# Patient Record
Sex: Male | Born: 1975 | Race: Black or African American | Hispanic: No | Marital: Single | Smoking: Current some day smoker
Health system: Southern US, Community
[De-identification: ages and names within clinical notes are randomized; demographics above are authoritative.]

## PROBLEM LIST (undated history)

## (undated) DIAGNOSIS — R569 Unspecified convulsions: Secondary | ICD-10-CM

## (undated) DIAGNOSIS — F1910 Other psychoactive substance abuse, uncomplicated: Secondary | ICD-10-CM

## (undated) DIAGNOSIS — F1010 Alcohol abuse, uncomplicated: Secondary | ICD-10-CM

## (undated) DIAGNOSIS — I10 Essential (primary) hypertension: Secondary | ICD-10-CM

## (undated) DIAGNOSIS — F1410 Cocaine abuse, uncomplicated: Secondary | ICD-10-CM

## (undated) HISTORY — PX: FACIAL COSMETIC SURGERY: SHX629

---

## 2000-10-12 ENCOUNTER — Emergency Department (HOSPITAL_COMMUNITY): Admission: EM | Admit: 2000-10-12 | Discharge: 2000-10-12 | Payer: Self-pay | Admitting: *Deleted

## 2002-07-17 ENCOUNTER — Encounter: Payer: Self-pay | Admitting: Emergency Medicine

## 2002-07-17 ENCOUNTER — Emergency Department (HOSPITAL_COMMUNITY): Admission: EM | Admit: 2002-07-17 | Discharge: 2002-07-17 | Payer: Self-pay | Admitting: Emergency Medicine

## 2002-07-19 ENCOUNTER — Emergency Department (HOSPITAL_COMMUNITY): Admission: EM | Admit: 2002-07-19 | Discharge: 2002-07-19 | Payer: Self-pay | Admitting: Emergency Medicine

## 2002-07-19 ENCOUNTER — Encounter: Payer: Self-pay | Admitting: *Deleted

## 2002-09-25 ENCOUNTER — Emergency Department (HOSPITAL_COMMUNITY): Admission: EM | Admit: 2002-09-25 | Discharge: 2002-09-26 | Payer: Self-pay | Admitting: Emergency Medicine

## 2002-09-26 ENCOUNTER — Encounter: Payer: Self-pay | Admitting: Emergency Medicine

## 2002-10-26 ENCOUNTER — Emergency Department (HOSPITAL_COMMUNITY): Admission: EM | Admit: 2002-10-26 | Discharge: 2002-10-26 | Payer: Self-pay | Admitting: Emergency Medicine

## 2004-04-17 ENCOUNTER — Emergency Department (HOSPITAL_COMMUNITY): Admission: EM | Admit: 2004-04-17 | Discharge: 2004-04-18 | Payer: Self-pay | Admitting: Emergency Medicine

## 2004-07-17 ENCOUNTER — Emergency Department (HOSPITAL_COMMUNITY): Admission: EM | Admit: 2004-07-17 | Discharge: 2004-07-17 | Payer: Self-pay | Admitting: *Deleted

## 2004-07-22 ENCOUNTER — Emergency Department (HOSPITAL_COMMUNITY): Admission: EM | Admit: 2004-07-22 | Discharge: 2004-07-22 | Payer: Self-pay | Admitting: *Deleted

## 2004-07-29 ENCOUNTER — Emergency Department (HOSPITAL_COMMUNITY): Admission: EM | Admit: 2004-07-29 | Discharge: 2004-07-29 | Payer: Self-pay | Admitting: *Deleted

## 2004-08-13 ENCOUNTER — Emergency Department (HOSPITAL_COMMUNITY): Admission: EM | Admit: 2004-08-13 | Discharge: 2004-08-13 | Payer: Self-pay | Admitting: Emergency Medicine

## 2004-08-29 ENCOUNTER — Emergency Department (HOSPITAL_COMMUNITY): Admission: EM | Admit: 2004-08-29 | Discharge: 2004-08-29 | Payer: Self-pay | Admitting: Emergency Medicine

## 2004-09-26 ENCOUNTER — Emergency Department (HOSPITAL_COMMUNITY): Admission: EM | Admit: 2004-09-26 | Discharge: 2004-09-26 | Payer: Self-pay | Admitting: Emergency Medicine

## 2004-09-29 ENCOUNTER — Ambulatory Visit: Payer: Self-pay | Admitting: Orthopedic Surgery

## 2004-10-13 ENCOUNTER — Emergency Department (HOSPITAL_COMMUNITY): Admission: EM | Admit: 2004-10-13 | Discharge: 2004-10-13 | Payer: Self-pay | Admitting: Emergency Medicine

## 2005-08-23 ENCOUNTER — Emergency Department (HOSPITAL_COMMUNITY): Admission: EM | Admit: 2005-08-23 | Discharge: 2005-08-24 | Payer: Self-pay | Admitting: Emergency Medicine

## 2008-04-27 ENCOUNTER — Emergency Department (HOSPITAL_COMMUNITY): Admission: EM | Admit: 2008-04-27 | Discharge: 2008-04-27 | Payer: Self-pay | Admitting: Emergency Medicine

## 2009-06-18 ENCOUNTER — Emergency Department (HOSPITAL_COMMUNITY): Admission: EM | Admit: 2009-06-18 | Discharge: 2009-06-18 | Payer: Self-pay | Admitting: Emergency Medicine

## 2009-08-25 ENCOUNTER — Emergency Department (HOSPITAL_COMMUNITY): Admission: EM | Admit: 2009-08-25 | Discharge: 2009-08-25 | Payer: Self-pay | Admitting: Emergency Medicine

## 2009-11-14 ENCOUNTER — Emergency Department (HOSPITAL_COMMUNITY): Admission: EM | Admit: 2009-11-14 | Discharge: 2009-11-14 | Payer: Self-pay | Admitting: Emergency Medicine

## 2010-04-24 LAB — DIFFERENTIAL
Basophils Relative: 0 % (ref 0–1)
Eosinophils Relative: 0 % (ref 0–5)
Lymphs Abs: 1.6 10*3/uL (ref 0.7–4.0)
Monocytes Absolute: 0.3 10*3/uL (ref 0.1–1.0)
Neutro Abs: 9.2 10*3/uL — ABNORMAL HIGH (ref 1.7–7.7)
Neutrophils Relative %: 82 % — ABNORMAL HIGH (ref 43–77)

## 2010-04-24 LAB — URINE MICROSCOPIC-ADD ON

## 2010-04-24 LAB — URINALYSIS, ROUTINE W REFLEX MICROSCOPIC
Glucose, UA: NEGATIVE mg/dL
Leukocytes, UA: NEGATIVE
Nitrite: NEGATIVE
Protein, ur: NEGATIVE mg/dL
Specific Gravity, Urine: 1.015 (ref 1.005–1.030)
pH: 5 (ref 5.0–8.0)

## 2010-04-24 LAB — CBC
Hemoglobin: 15.9 g/dL (ref 13.0–17.0)
MCH: 31.7 pg (ref 26.0–34.0)
MCHC: 34 g/dL (ref 30.0–36.0)
Platelets: 207 10*3/uL (ref 150–400)
RDW: 12.5 % (ref 11.5–15.5)

## 2010-04-24 LAB — BASIC METABOLIC PANEL
Calcium: 8.8 mg/dL (ref 8.4–10.5)
Chloride: 109 mEq/L (ref 96–112)
GFR calc non Af Amer: 49 mL/min — ABNORMAL LOW (ref >60)
Sodium: 142 mEq/L (ref 135–145)

## 2010-04-24 LAB — GLUCOSE, CAPILLARY: Glucose-Capillary: 91 mg/dL (ref 70–99)

## 2010-04-24 LAB — RAPID URINE DRUG SCREEN, HOSP PERFORMED
Barbiturates: NOT DETECTED
Benzodiazepines: POSITIVE — AB

## 2010-05-20 LAB — RAPID STREP SCREEN (MED CTR MEBANE ONLY): Streptococcus, Group A Screen (Direct): POSITIVE — AB

## 2011-02-05 ENCOUNTER — Encounter: Payer: Self-pay | Admitting: Emergency Medicine

## 2011-02-05 ENCOUNTER — Emergency Department (HOSPITAL_COMMUNITY)
Admission: EM | Admit: 2011-02-05 | Discharge: 2011-02-05 | Disposition: A | Discharge status: Home / Self Care | Payer: Self-pay | Attending: Emergency Medicine | Admitting: Emergency Medicine

## 2011-02-05 DIAGNOSIS — F17200 Nicotine dependence, unspecified, uncomplicated: Secondary | ICD-10-CM | POA: Insufficient documentation

## 2011-02-05 DIAGNOSIS — K0889 Other specified disorders of teeth and supporting structures: Secondary | ICD-10-CM

## 2011-02-05 DIAGNOSIS — K029 Dental caries, unspecified: Secondary | ICD-10-CM | POA: Insufficient documentation

## 2011-02-05 DIAGNOSIS — K089 Disorder of teeth and supporting structures, unspecified: Secondary | ICD-10-CM | POA: Insufficient documentation

## 2011-02-05 MED ORDER — IBUPROFEN 800 MG PO TABS
800.0000 mg | ORAL_TABLET | Freq: Once | ORAL | Status: AC
  Administered 2011-02-05: 800 mg via ORAL
  Filled 2011-02-05: qty 1

## 2011-02-05 MED ORDER — HYDROCODONE-ACETAMINOPHEN 5-325 MG PO TABS: 1.0000 | ORAL_TABLET | Freq: Four times a day (QID) | ORAL | Status: AC | PRN

## 2011-02-05 MED ORDER — HYDROCODONE-ACETAMINOPHEN 5-325 MG PO TABS
1.0000 | ORAL_TABLET | Freq: Once | ORAL | Status: AC
  Administered 2011-02-05: 1 via ORAL
  Filled 2011-02-05: qty 1

## 2011-02-05 MED ORDER — PENICILLIN V POTASSIUM 250 MG PO TABS
500.0000 mg | ORAL_TABLET | Freq: Once | ORAL | Status: AC
  Administered 2011-02-05: 500 mg via ORAL
  Filled 2011-02-05: qty 2

## 2011-02-05 MED ORDER — PENICILLIN V POTASSIUM 500 MG PO TABS: 500.0000 mg | ORAL_TABLET | Freq: Four times a day (QID) | ORAL | Status: AC

## 2011-02-05 NOTE — ED Notes (Signed)
Pt c/o dental pain since last night.  

## 2011-02-05 NOTE — ED Provider Notes (Signed)
History     CSN: [id number]  Arrival date & time 02/05/11  1021   First MD Initiated Contact with Patient 02/05/11 1105      Chief Complaint  Patient presents with  . Dental Pain    (Consider location/radiation/quality/duration/timing/severity/associated sxs/prior treatment) Patient is a 34 y.o. male presenting with tooth pain. The history is provided by the patient. No language interpreter was used.  Dental PainThe primary symptoms include mouth pain. Primary symptoms do not include fever. The symptoms began yesterday. The symptoms are unchanged. The symptoms are new. The symptoms occur constantly.    History reviewed. No pertinent past medical history.  History reviewed. No pertinent past surgical history.  History reviewed. No pertinent family history.  History  Substance Use Topics  . Smoking status: Passive Smoker  . Smokeless tobacco: Not on file  . Alcohol Use: Yes      Review of Systems  Constitutional: Negative for fever.  HENT: Positive for dental problem.   All other systems reviewed and are negative.    Allergies  Onion  Home Medications  No current outpatient prescriptions on file.  BP 137/85  Pulse 90  Temp(Src) 98.9 F (37.2 C) (Oral)  Resp 18  Ht 5\' 9"  (1.753 m)  Wt 170 lb (77.111 kg)  BMI 25.10 kg/m2  SpO2 100%  Physical Exam  Nursing note and vitals reviewed. Constitutional: He is oriented to person, place, and time. He appears well-developed and well-nourished. No distress.  HENT:  Head: Normocephalic and atraumatic.  Mouth/Throat: Uvula is midline and mucous membranes are normal. Abnormal dentition. No oropharyngeal exudate.    Eyes: EOM are normal.  Neck: Normal range of motion.  Cardiovascular: Normal rate, regular rhythm, normal heart sounds and intact distal pulses.   Pulmonary/Chest: Effort normal and breath sounds normal. No respiratory distress.  Abdominal: Soft. He exhibits no distension. There is no tenderness.    Musculoskeletal: Normal range of motion.  Neurological: He is alert and oriented to person, place, and time.  Skin: Skin is warm and dry.  Psychiatric: He has a normal mood and affect. Judgment normal.    ED Course  Procedures (including critical care time)  Labs Reviewed - No data to display No results found.   No diagnosis found.    MDM          Worthy Rancher, PA 02/05/11 1224

## 2011-02-05 NOTE — ED Provider Notes (Signed)
Medical screening examination/treatment/procedure(s) were performed by non-physician practitioner and as supervising physician I was immediately available for consultation/collaboration.   Glynn Octave, MD 02/05/11 [phone]

## 2011-02-05 NOTE — ED Notes (Signed)
Pt states tooth ache "kept me awake all night, took tylenol and motrin with no relief."  Pt has 2 dental caries in bilateral lower molars. Mild edema and redness noted. No facial; swelling noted.

## 2011-06-23 ENCOUNTER — Emergency Department (HOSPITAL_COMMUNITY)
Admission: EM | Admit: 2011-06-23 | Discharge: 2011-06-23 | Disposition: A | Discharge status: Home / Self Care | Payer: Self-pay | Attending: Emergency Medicine | Admitting: Emergency Medicine

## 2011-06-23 ENCOUNTER — Encounter (HOSPITAL_COMMUNITY): Payer: Self-pay | Admitting: *Deleted

## 2011-06-23 ENCOUNTER — Encounter (HOSPITAL_COMMUNITY): Payer: Self-pay

## 2011-06-23 ENCOUNTER — Emergency Department (HOSPITAL_COMMUNITY): Payer: Self-pay

## 2011-06-23 DIAGNOSIS — S0190XA Unspecified open wound of unspecified part of head, initial encounter: Secondary | ICD-10-CM | POA: Insufficient documentation

## 2011-06-23 DIAGNOSIS — R221 Localized swelling, mass and lump, neck: Secondary | ICD-10-CM | POA: Insufficient documentation

## 2011-06-23 DIAGNOSIS — S0001XA Abrasion of scalp, initial encounter: Secondary | ICD-10-CM

## 2011-06-23 DIAGNOSIS — J3489 Other specified disorders of nose and nasal sinuses: Secondary | ICD-10-CM | POA: Insufficient documentation

## 2011-06-23 DIAGNOSIS — F1010 Alcohol abuse, uncomplicated: Secondary | ICD-10-CM | POA: Insufficient documentation

## 2011-06-23 DIAGNOSIS — S0101XA Laceration without foreign body of scalp, initial encounter: Secondary | ICD-10-CM

## 2011-06-23 DIAGNOSIS — S41109A Unspecified open wound of unspecified upper arm, initial encounter: Secondary | ICD-10-CM | POA: Insufficient documentation

## 2011-06-23 DIAGNOSIS — S022XXA Fracture of nasal bones, initial encounter for closed fracture: Secondary | ICD-10-CM | POA: Insufficient documentation

## 2011-06-23 DIAGNOSIS — R000 Tachycardia, unspecified: Secondary | ICD-10-CM | POA: Insufficient documentation

## 2011-06-23 DIAGNOSIS — R220 Localized swelling, mass and lump, head: Secondary | ICD-10-CM | POA: Insufficient documentation

## 2011-06-23 DIAGNOSIS — R51 Headache: Secondary | ICD-10-CM | POA: Insufficient documentation

## 2011-06-23 DIAGNOSIS — S0990XA Unspecified injury of head, initial encounter: Secondary | ICD-10-CM | POA: Insufficient documentation

## 2011-06-23 DIAGNOSIS — M542 Cervicalgia: Secondary | ICD-10-CM | POA: Insufficient documentation

## 2011-06-23 DIAGNOSIS — S0100XA Unspecified open wound of scalp, initial encounter: Secondary | ICD-10-CM | POA: Insufficient documentation

## 2011-06-23 DIAGNOSIS — R5381 Other malaise: Secondary | ICD-10-CM | POA: Insufficient documentation

## 2011-06-23 DIAGNOSIS — R42 Dizziness and giddiness: Secondary | ICD-10-CM | POA: Insufficient documentation

## 2011-06-23 LAB — CBC
HCT: 44.8 % (ref 39.0–52.0)
MCHC: 33.9 g/dL (ref 30.0–36.0)
Platelets: 232 10*3/uL (ref 150–400)

## 2011-06-23 MED ORDER — HYDROCODONE-ACETAMINOPHEN 5-325 MG PO TABS
1.0000 | ORAL_TABLET | Freq: Once | ORAL | Status: AC
  Administered 2011-06-23: 1 via ORAL
  Filled 2011-06-23: qty 1

## 2011-06-23 MED ORDER — IBUPROFEN 600 MG PO TABS: 600.0000 mg | ORAL_TABLET | Freq: Four times a day (QID) | ORAL | Status: DC | PRN

## 2011-06-23 MED ORDER — OXYMETAZOLINE HCL 0.05 % NA SOLN
NASAL | Status: AC
  Administered 2011-06-23: 2
  Filled 2011-06-23: qty 15

## 2011-06-23 MED ORDER — HYDROCODONE-ACETAMINOPHEN 5-325 MG PO TABS: 1.0000 | ORAL_TABLET | ORAL | Status: DC | PRN
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
## 2011-06-23 NOTE — Discharge Instructions (Signed)
Head Injury, Adult  You have had a head injury that does not appear serious at this time. A concussion is a state of changed mental ability, usually from a blow to the head. You should take clear liquids for the rest of the day and then resume your regular diet. You should not take sedatives or alcoholic beverages for as long as directed by your caregiver after discharge. After injuries such as yours, most problems occur within the first 24 hours.  SYMPTOMS  These minor symptoms may be experienced after discharge:  · Memory difficulties.  · Dizziness.  · Headaches.  · Double vision.  · Hearing difficulties.  · Depression.  · Tiredness.  · Weakness.  · Difficulty with concentration.  If you experience any of these problems, you should not be alarmed. A concussion requires a few days for recovery. Many patients with head injuries frequently experience such symptoms. Usually, these problems disappear without medical care. If symptoms last for more than one day, notify your caregiver. See your caregiver sooner if symptoms are becoming worse rather than better.  HOME CARE INSTRUCTIONS   · During the next 24 hours you must stay with someone who can watch you for the warning signs listed below.  Although it is unlikely that serious side effects will occur, you should be aware of signs and symptoms which may necessitate your return to this location. Side effects may occur up to 7 - 10 days following the injury. It is important for you to carefully monitor your condition and contact your caregiver or seek immediate medical attention if there is a change in your condition.  SEEK IMMEDIATE MEDICAL CARE IF:   · There is confusion or drowsiness.  · You can not awaken the injured person.  · There is nausea (feeling sick to your stomach) or continued, forceful vomiting.  · You notice dizziness or unsteadiness which is getting worse, or inability to walk.  · You have convulsions or unconsciousness.  · You experience severe,  persistent headaches not relieved by over-the-counter or prescription medicines for pain. (Do not take aspirin as this impairs clotting abilities). Take other pain medications only as directed.  · You can not use arms or legs normally.  · There is clear or bloody discharge from the nose or ears.  MAKE SURE YOU:   · Understand these instructions.  · Will watch your condition.  · Will get help right away if you are not doing well or get worse.  Document Released: 01/24/2005 Document Revised: 01/13/2011 Document Reviewed: 12/12/2008  [employer]® Patient Information ©2012 [employer].

## 2011-06-23 NOTE — ED Notes (Signed)
Patient transported to CT scan via stretcher.
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
## 2011-06-23 NOTE — ED Notes (Signed)
Patient returned from CT scan via stretcher.  Head laceration cleaned by A Hunt, EDT.
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
## 2011-06-23 NOTE — ED Notes (Signed)
Patient states everything is fine and he wants to go home.  Patient informed we are waiting on CT scan results and the MD will come in to talk with him.  Patient sitting on stretcher.  Significant other at bedside.
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
## 2011-06-30 NOTE — ED Provider Notes (Signed)
Medical screening examination/treatment/procedure(s) were performed by non-physician practitioner and as supervising physician I was immediately available for consultation/collaboration.   Carleene Cooper III, MD 06/30/11 1539

## 2011-07-01 ENCOUNTER — Emergency Department (HOSPITAL_COMMUNITY)
Admission: EM | Admit: 2011-07-01 | Discharge: 2011-07-01 | Disposition: A | Discharge status: Home / Self Care | Payer: Self-pay | Attending: Emergency Medicine | Admitting: Emergency Medicine

## 2011-07-01 ENCOUNTER — Encounter (HOSPITAL_COMMUNITY): Payer: Self-pay | Admitting: *Deleted

## 2011-07-01 DIAGNOSIS — Z4802 Encounter for removal of sutures: Secondary | ICD-10-CM | POA: Insufficient documentation

## 2011-07-01 MED ORDER — NAPROXEN 500 MG PO TABS: 500.0000 mg | ORAL_TABLET | Freq: Two times a day (BID) | ORAL | Status: DC

## 2011-07-01 NOTE — ED Notes (Signed)
Pt returned to Ed to have staples removed from occipital region of scalp. Wound edges are approximated and well healed with scab over entire wound. Pt reports is still having pain from fracture of nose. Pt's has an obvious deviated septum. Denies difficulty breathing from nares. States was referred to specialist but hasn't made it there. Pt had a distinct odor of alcohol during this visit, although denies drinking today. " it must be left from last night". Pt pleasant during visit.

## 2011-07-01 NOTE — ED Provider Notes (Signed)
Medical screening examination/treatment/procedure(s) were performed by non-physician practitioner and as supervising physician I was immediately available for consultation/collaboration.   Dayton Bailiff, MD 07/01/11 1726

## 2011-07-01 NOTE — ED Notes (Signed)
Here for staple removal from head  
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
## 2011-08-20 ENCOUNTER — Emergency Department (HOSPITAL_COMMUNITY): Payer: Self-pay

## 2011-08-20 ENCOUNTER — Encounter (HOSPITAL_COMMUNITY): Payer: Self-pay

## 2011-08-20 ENCOUNTER — Emergency Department (HOSPITAL_COMMUNITY)
Admission: EM | Admit: 2011-08-20 | Discharge: 2011-08-21 | Disposition: A | Discharge status: Home / Self Care | Payer: Self-pay | Attending: Emergency Medicine | Admitting: Emergency Medicine

## 2011-08-20 DIAGNOSIS — M25549 Pain in joints of unspecified hand: Secondary | ICD-10-CM | POA: Insufficient documentation

## 2011-08-20 DIAGNOSIS — M2540 Effusion, unspecified joint: Secondary | ICD-10-CM | POA: Insufficient documentation

## 2011-08-20 DIAGNOSIS — S61219A Laceration without foreign body of unspecified finger without damage to nail, initial encounter: Secondary | ICD-10-CM

## 2011-08-20 DIAGNOSIS — M79609 Pain in unspecified limb: Secondary | ICD-10-CM | POA: Insufficient documentation

## 2011-08-20 DIAGNOSIS — IMO0002 Reserved for concepts with insufficient information to code with codable children: Secondary | ICD-10-CM | POA: Insufficient documentation

## 2011-08-20 DIAGNOSIS — S61209A Unspecified open wound of unspecified finger without damage to nail, initial encounter: Secondary | ICD-10-CM | POA: Insufficient documentation

## 2011-08-20 NOTE — ED Provider Notes (Signed)
History     CSN: [id number]  Arrival date & time 08/20/11  2307   First MD Initiated Contact with Patient 08/20/11 2317      Chief Complaint  Patient presents with  . Laceration  . Finger Injury    (Consider location/radiation/quality/duration/timing/severity/associated sxs/prior treatment) HPI Comments: Patient c/o pain and laceration to his left ring finger that occurred when he slammed his finger in a screen door just prior to ED arrival.  Pain to the finger is reproduced with movement.  He denies possible foreign body, numbness or weakness of the finger.  Patient is a 36 y.o. male presenting with skin laceration. The history is provided by the patient.  Laceration  The incident occurred 1 to 2 hours ago. Pain location: left ring finger. The laceration is 1 cm in size. The laceration mechanism was a a metal edge. The pain is mild. The pain has been constant since onset. He reports no foreign bodies present. His tetanus status is UTD.    History reviewed. No pertinent past medical history.  History reviewed. No pertinent past surgical history.  No family history on file.  History  Substance Use Topics  . Smoking status: Passive Smoker  . Smokeless tobacco: Not on file  . Alcohol Use: Yes      Review of Systems  Constitutional: Negative for fever and chills.  Musculoskeletal: Positive for joint swelling and arthralgias.  Skin: Positive for wound. Negative for color change.  Neurological: Negative for weakness and numbness.  All other systems reviewed and are negative.    Allergies  Onion  Home Medications   Current Outpatient Rx  Name Route Sig Dispense Refill  . NAPROXEN 500 MG PO TABS Oral Take 1 tablet (500 mg total) by mouth 2 (two) times daily. Prn pain 20 tablet 0  . OXYMETAZOLINE HCL 0.05 % NA SOLN Nasal Place 2 sprays into the nose daily as needed. For nasal symptoms      BP 133/75  Pulse 117  Temp 98.7 F (37.1 C) (Oral)  Resp 16  Ht 5\' 9"   (1.753 m)  Wt 170 lb (77.111 kg)  BMI 25.10 kg/m2  SpO2 92%  Physical Exam  Nursing note and vitals reviewed. Constitutional: He is oriented to person, place, and time. He appears well-developed and well-nourished. No distress.  HENT:  Head: Normocephalic and atraumatic.  Cardiovascular: Normal rate, regular rhythm and normal heart sounds.   Pulmonary/Chest: Effort normal and breath sounds normal.  Musculoskeletal: He exhibits tenderness. He exhibits no edema.       Left hand: He exhibits tenderness and laceration. He exhibits normal range of motion, normal capillary refill and no swelling. normal sensation noted. Normal strength noted.       Hands:      ttp of the PIP joint of the left ring finger.  <1 cm laceration also present.  Radial pulse is brisk, distal sensation intact.  CR< 2 sec.  Pain to finger is reproduced with flexion. Small abrasion to dorsal left hand.    Neurological: He is alert and oriented to person, place, and time. He exhibits normal muscle tone. Coordination normal.  Skin: Skin is warm and dry.    ED Course  Procedures (including critical care time)  Labs Reviewed - No data to display   Dg Finger Ring Left  08/21/2011  *RADIOLOGY REPORT*  Clinical Data: Left ring finger laceration.  LEFT RING FINGER 2+V  Comparison: 09/26/2004 hand radiograph  Findings: No fracture or dislocation.  No radiopaque  foreign body.  IMPRESSION: No acute osseous abnormality or radiopaque foreign body.  Original Report Authenticated By: Waneta Martins, M.D.    MDM    LACERATION REPAIR Performed by: Prosperity Darrough L. Authorized by: Maxwell Caul Consent: Verbal consent obtained. Risks and benefits: risks, benefits and alternatives were discussed Consent given by: patient Patient identity confirmed: provided demographic data Prepped and Draped in normal sterile fashion Wound explored  Laceration Location: left finger Laceration Length: 1cm  No Foreign Bodies seen or  palpated  Anesthesia:none    Irrigation method: syringe Amount of cleaning: standard  Skin closure: steri-strips Number of sutures: 2   Patient tolerance: Patient tolerated the procedure well with no immediate complications.     Finger was bandaged and finger splint applied by the nursing staff. Pain improved, remains NV intact.    The patient appears reasonably screened and/or stabilized for discharge and I doubt any other medical condition or other [facility] requiring further screening, evaluation, or treatment in the ED at this time prior to discharge.     Chrishawn Kring L. Svensen, [place] 08/21/11 [phone]

## 2011-08-20 NOTE — ED Notes (Signed)
Pt slammed his left ring finger in screen door, has avulsion injury to same, bleeding  controlled

## 2011-08-21 MED ORDER — NAPROXEN 500 MG PO TABS: 500.0000 mg | ORAL_TABLET | Freq: Two times a day (BID) | ORAL | Status: DC

## 2011-08-21 MED ORDER — HYDROCODONE-ACETAMINOPHEN 5-325 MG PO TABS
1.0000 | ORAL_TABLET | Freq: Once | ORAL | Status: DC
  Filled 2011-08-21: qty 1

## 2011-08-21 NOTE — ED Provider Notes (Signed)
Medical screening examination/treatment/procedure(s) were performed by non-physician practitioner and as supervising physician I was immediately available for consultation/collaboration.  Donnetta Hutching, MD 08/21/11 [phone]

## 2011-11-14 ENCOUNTER — Encounter (HOSPITAL_COMMUNITY): Payer: Self-pay | Admitting: Emergency Medicine

## 2011-11-14 ENCOUNTER — Emergency Department (HOSPITAL_COMMUNITY)
Admission: EM | Admit: 2011-11-14 | Discharge: 2011-11-14 | Disposition: A | Discharge status: Home / Self Care | Payer: Self-pay | Attending: Emergency Medicine | Admitting: Emergency Medicine

## 2011-11-14 DIAGNOSIS — A084 Viral intestinal infection, unspecified: Secondary | ICD-10-CM

## 2011-11-14 DIAGNOSIS — A088 Other specified intestinal infections: Secondary | ICD-10-CM | POA: Insufficient documentation

## 2011-11-14 LAB — URINALYSIS, ROUTINE W REFLEX MICROSCOPIC
Leukocytes, UA: NEGATIVE
Nitrite: NEGATIVE
Specific Gravity, Urine: 1.025 (ref 1.005–1.030)
Urobilinogen, UA: 0.2 mg/dL (ref 0.0–1.0)
pH: 6.5 (ref 5.0–8.0)

## 2011-11-14 LAB — CBC
MCH: 31.5 pg (ref 26.0–34.0)
MCHC: 35.2 g/dL (ref 30.0–36.0)
RDW: 13 % (ref 11.5–15.5)

## 2011-11-14 LAB — COMPREHENSIVE METABOLIC PANEL
Albumin: 4.6 g/dL (ref 3.5–5.2)
Alkaline Phosphatase: 63 U/L (ref 39–117)
BUN: 6 mg/dL (ref 6–23)
Calcium: 9.2 mg/dL (ref 8.4–10.5)
Potassium: 3.5 mEq/L (ref 3.5–5.1)
Sodium: 139 mEq/L (ref 135–145)
Total Protein: 7.8 g/dL (ref 6.0–8.3)

## 2011-11-14 LAB — URINE MICROSCOPIC-ADD ON

## 2011-11-14 LAB — LIPASE, BLOOD: Lipase: 24 U/L (ref 11–59)

## 2011-11-14 MED ORDER — PROMETHAZINE HCL 12.5 MG PO TABS
25.0000 mg | ORAL_TABLET | Freq: Once | ORAL | Status: AC
  Administered 2011-11-14: 25 mg via ORAL
  Filled 2011-11-14: qty 2

## 2011-11-14 MED ORDER — DIPHENOXYLATE-ATROPINE 2.5-0.025 MG PO TABS: 1.0000 | ORAL_TABLET | Freq: Four times a day (QID) | ORAL | Status: DC | PRN

## 2011-11-14 MED ORDER — ONDANSETRON HCL 4 MG/2ML IJ SOLN
4.0000 mg | Freq: Once | INTRAMUSCULAR | Status: AC
  Administered 2011-11-14: 4 mg via INTRAVENOUS
  Filled 2011-11-14: qty 2

## 2011-11-14 MED ORDER — PROMETHAZINE HCL 25 MG PO TABS: 25.0000 mg | ORAL_TABLET | Freq: Four times a day (QID) | ORAL | Status: DC | PRN

## 2011-11-14 MED ORDER — SODIUM CHLORIDE 0.9 % IV BOLUS (SEPSIS)
1000.0000 mL | Freq: Once | INTRAVENOUS | Status: AC
  Administered 2011-11-14: 1000 mL via INTRAVENOUS

## 2011-11-14 MED ORDER — DIPHENOXYLATE-ATROPINE 2.5-0.025 MG PO TABS
2.0000 | ORAL_TABLET | Freq: Once | ORAL | Status: AC
  Administered 2011-11-14: 2 via ORAL
  Filled 2011-11-14: qty 2

## 2011-11-14 NOTE — ED Provider Notes (Signed)
Medical screening examination/treatment/procedure(s) were performed by non-physician practitioner and as supervising physician I was immediately available for consultation/collaboration.  Shelda Jakes, MD 11/14/11 1159

## 2011-11-14 NOTE — ED Notes (Signed)
N/v/d started this am. Denies pain. Vomited x 5, diarrhea x 3. Mm wet. No ss of dehydration. Pt supposed to be in court this am

## 2011-11-14 NOTE — ED Notes (Signed)
Notified Clerks office at [phone] per pt request that pt was in [facility] ED. Was directed by clerk to tell pt to bring discharge paperwork to court clerk immediately after discharge. Pt verbalized understanding of instructions from clerk.

## 2011-11-14 NOTE — ED Notes (Signed)
Pt is aware of urine sample, pt states he will try to void. RN at bedside.

## 2011-11-14 NOTE — ED Provider Notes (Addendum)
History     CSN: [id number]  Arrival date & time 11/14/11  4098   First MD Initiated Contact with Patient 11/14/11 [phone]      Chief Complaint  Patient presents with  . Emesis    (Consider location/radiation/quality/duration/timing/severity/associated sxs/prior treatment) HPI Comments: Paul Lambert presents with a 12 hour history of worsening nausea, nonbloody vomiting and diarrhea associated with abdominal cramping which resolves with diarrheal episodes.  He denies fevers and chills.  His wife started having similar symptoms this morning. He denies eating any improperly cooked or suspicious tasting foods and they have not been to any restaurants this weekend.  He has no significant past medical history,  And has not taken any medications for relief of symptoms.  The history is provided by the patient.    History reviewed. No pertinent past medical history.  Past Surgical History  Procedure Date  . Facial cosmetic surgery     No family history on file.  History  Substance Use Topics  . Smoking status: Passive Smoke Exposure - Never Smoker  . Smokeless tobacco: Not on file  . Alcohol Use: Yes     twice a week.       Review of Systems  Constitutional: Negative for fever.  HENT: Negative for congestion, sore throat and neck pain.   Eyes: Negative.   Respiratory: Negative for chest tightness and shortness of breath.   Cardiovascular: Negative for chest pain.  Gastrointestinal: Positive for nausea, vomiting, abdominal pain and diarrhea. Negative for abdominal distention.       Mild generalized abdominal "soreness",  No guarding,  No rebound, no distention or increased tympany.  Abdomen is soft.  Genitourinary: Negative.   Musculoskeletal: Negative for joint swelling and arthralgias.  Skin: Negative.  Negative for rash and wound.  Neurological: Negative for dizziness, weakness, light-headedness, numbness and headaches.  Hematological: Negative.   Psychiatric/Behavioral:  Negative.     Allergies  Onion  Home Medications   Current Outpatient Rx  Name Route Sig Dispense Refill  . DIPHENOXYLATE-ATROPINE 2.5-0.025 MG PO TABS Oral Take 1 tablet by mouth 4 (four) times daily as needed for diarrhea or loose stools. 30 tablet 0  . PROMETHAZINE HCL 25 MG PO TABS Oral Take 1 tablet (25 mg total) by mouth every 6 (six) hours as needed for nausea. 15 tablet 0    BP 152/73  Pulse 67  Temp 98.4 F (36.9 C) (Oral)  Resp 18  SpO2 99%  Physical Exam  Nursing note and vitals reviewed. Constitutional: He appears well-developed and well-nourished.  HENT:  Head: Normocephalic and atraumatic.  Eyes: Conjunctivae normal are normal.  Neck: Normal range of motion.  Cardiovascular: Regular rhythm, normal heart sounds and intact distal pulses.        Borderline tachy  Pulmonary/Chest: Effort normal and breath sounds normal. He has no wheezes.  Abdominal: Soft. Bowel sounds are normal. There is no tenderness.  Musculoskeletal: Normal range of motion.  Neurological: He is alert.  Skin: Skin is warm and dry.  Psychiatric: He has a normal mood and affect.    ED Course  Procedures (including critical care time)  Labs Reviewed  COMPREHENSIVE METABOLIC PANEL - Abnormal; Notable for the following:    Glucose, Bld 106 (*)     AST 46 (*)     Total Bilirubin 2.0 (*)     All other components within normal limits  URINALYSIS, ROUTINE W REFLEX MICROSCOPIC - Abnormal; Notable for the following:    Hgb urine dipstick  SMALL (*)     Bilirubin Urine SMALL (*)     Ketones, ur 40 (*)     Protein, ur 30 (*)     All other components within normal limits  CBC  LIPASE, BLOOD  URINE MICROSCOPIC-ADD ON   No results found.  Results for orders placed during the hospital encounter of 11/14/11  CBC      Component Value Range   WBC 4.2  4.0 - 10.5 K/uL   RBC 4.98  4.22 - 5.81 MIL/uL   Hemoglobin 15.7  13.0 - 17.0 g/dL   HCT 16.1  09.6 - 04.5 %   MCV 89.6  78.0 - 100.0 fL    MCH 31.5  26.0 - 34.0 pg   MCHC 35.2  30.0 - 36.0 g/dL   RDW 40.9  81.1 - 91.4 %   Platelets 263  150 - 400 K/uL  COMPREHENSIVE METABOLIC PANEL      Component Value Range   Sodium 139  135 - 145 mEq/L   Potassium 3.5  3.5 - 5.1 mEq/L   Chloride 100  96 - 112 mEq/L   CO2 24  19 - 32 mEq/L   Glucose, Bld 106 (*) 70 - 99 mg/dL   BUN 6  6 - 23 mg/dL   Creatinine, Ser 7.82  0.50 - 1.35 mg/dL   Calcium 9.2  8.4 - 95.6 mg/dL   Total Protein 7.8  6.0 - 8.3 g/dL   Albumin 4.6  3.5 - 5.2 g/dL   AST 46 (*) 0 - 37 U/L   ALT 28  0 - 53 U/L   Alkaline Phosphatase 63  39 - 117 U/L   Total Bilirubin 2.0 (*) 0.3 - 1.2 mg/dL   GFR calc non Af Amer >90  >90 mL/min   GFR calc Af Amer >90  >90 mL/min  LIPASE, BLOOD      Component Value Range   Lipase 24  11 - 59 U/L  URINALYSIS, ROUTINE W REFLEX MICROSCOPIC      Component Value Range   Color, Urine YELLOW  YELLOW   APPearance CLEAR  CLEAR   Specific Gravity, Urine 1.025  1.005 - 1.030   pH 6.5  5.0 - 8.0   Glucose, UA NEGATIVE  NEGATIVE mg/dL   Hgb urine dipstick SMALL (*) NEGATIVE   Bilirubin Urine SMALL (*) NEGATIVE   Ketones, ur 40 (*) NEGATIVE mg/dL   Protein, ur 30 (*) NEGATIVE mg/dL   Urobilinogen, UA 0.2  0.0 - 1.0 mg/dL   Nitrite NEGATIVE  NEGATIVE   Leukocytes, UA NEGATIVE  NEGATIVE  URINE MICROSCOPIC-ADD ON      Component Value Range   RBC / HPF 3-6  <3 RBC/hpf      No diagnosis found. Diagnosis - viral gastroenteritis   MDM  IV fluids given,  zofran 4 mg IV.  Labs ordered,  Pt to attempt PO fluids.  If labs stable, suspect viral GI vs food borne infection,  Given wife with similar sx.  Of note,  Pt is due in court this am,  And expresses concern over missing this - sx possibly stress related as well.   Patients labs and/or radiological studies were reviewed during the medical decision making and disposition process. Slightly elevated AST. Pt reports he drank 2 beers last night, suspect this is the source of elevated  transaminase.  No upper abdominal pain at re-exam.  Neg murphy sign.  Pt tolerated PO fluids without prob.  Burgess Amor, PA 11/14/11 1156  Burgess Amor, PA 12/08/11 1251

## 2011-12-08 NOTE — ED Provider Notes (Signed)
Medical screening examination/treatment/procedure(s) were conducted as a shared visit with non-physician practitioner(s) and myself.  I personally evaluated the patient during the encounter   Shelda Jakes, MD 12/08/11 1447

## 2012-07-07 ENCOUNTER — Emergency Department (HOSPITAL_COMMUNITY): Payer: Self-pay

## 2012-07-07 ENCOUNTER — Emergency Department (HOSPITAL_COMMUNITY)
Admission: EM | Admit: 2012-07-07 | Discharge: 2012-07-07 | Disposition: A | Discharge status: Home / Self Care | Payer: Self-pay | Attending: Emergency Medicine | Admitting: Emergency Medicine

## 2012-07-07 ENCOUNTER — Encounter (HOSPITAL_COMMUNITY): Payer: Self-pay | Admitting: *Deleted

## 2012-07-07 DIAGNOSIS — Y929 Unspecified place or not applicable: Secondary | ICD-10-CM | POA: Insufficient documentation

## 2012-07-07 DIAGNOSIS — S92253A Displaced fracture of navicular [scaphoid] of unspecified foot, initial encounter for closed fracture: Secondary | ICD-10-CM | POA: Insufficient documentation

## 2012-07-07 DIAGNOSIS — S92251A Displaced fracture of navicular [scaphoid] of right foot, initial encounter for closed fracture: Secondary | ICD-10-CM

## 2012-07-07 DIAGNOSIS — X58XXXA Exposure to other specified factors, initial encounter: Secondary | ICD-10-CM | POA: Insufficient documentation

## 2012-07-07 DIAGNOSIS — Y939 Activity, unspecified: Secondary | ICD-10-CM | POA: Insufficient documentation

## 2012-07-07 MED ORDER — IBUPROFEN 600 MG PO TABS: 600.0000 mg | ORAL_TABLET | Freq: Four times a day (QID) | ORAL | Status: DC | PRN

## 2012-07-07 NOTE — ED Provider Notes (Signed)
Medical screening examination/treatment/procedure(s) were performed by non-physician practitioner and as supervising physician I was immediately available for consultation/collaboration.   Jaber Dunlow, MD 07/07/12 2343 

## 2012-07-07 NOTE — ED Notes (Signed)
Pt presents with right ankle swelling and pain x4 days. Pt is unsure if he injured foot. Pt states he woke up and noticed the pain and swelling.

## 2012-07-07 NOTE — ED Notes (Signed)
Pt c/o right ankle pain that started 4-5 days ago, denies any injury, cms intact distal

## 2012-07-07 NOTE — ED Provider Notes (Signed)
History     CSN: [id number]  Arrival date & time 07/07/12  1654   First MD Initiated Contact with Patient 07/07/12 1707      Chief Complaint  Patient presents with  . Ankle Pain    (Consider location/radiation/quality/duration/timing/severity/associated sxs/prior treatment) Patient is a 37 y.o. male presenting with ankle pain. The history is provided by the patient.  Ankle Pain Location:  Ankle and foot Time since incident:  5 days Injury: no   Ankle location:  R ankle Foot location:  R foot Pain details:    Severity:  Moderate   Onset quality:  Sudden   Duration:  5 days   Timing:  Constant   Progression:  Unchanged Chronicity:  New Dislocation: no   Foreign body present:  No foreign bodies Prior injury to area:  No Relieved by:  Nothing Worsened by:  Nothing tried Associated symptoms: no fever and no neck pain    DANIELL MANCINAS is a 37 y.o. male who presents to the ED with right ankle pain. The pain started 4 or 5 days ago. Unsure of injury. Patient states he woke one morning and had pain in his right foot and ankle and they were swollen. Unsure about the night before that. He has been walking on the foot since then. The pain is worse today. Patient states he is sure it is broken.  History reviewed. No pertinent past medical history.  Past Surgical History  Procedure Laterality Date  . Facial cosmetic surgery      No family history on file.  History  Substance Use Topics  . Smoking status: Passive Smoke Exposure - Never Smoker  . Smokeless tobacco: Not on file  . Alcohol Use: Yes     Comment: twice a week.       Review of Systems  Constitutional: Negative for fever, chills and appetite change.  HENT: Negative for neck pain.   Respiratory: Negative for shortness of breath.   Cardiovascular: Negative for chest pain.  Gastrointestinal: Negative for nausea and abdominal pain.  Musculoskeletal:       Right foot and ankle pain.  Skin: Negative for wound.    Neurological: Negative for headaches.  Psychiatric/Behavioral: The patient is not nervous/anxious.     Allergies  Onion  Home Medications   Current Outpatient Rx  Name  Route  Sig  Dispense  Refill  . diphenoxylate-atropine (LOMOTIL) 2.5-0.025 MG per tablet   Oral   Take 1 tablet by mouth 4 (four) times daily as needed for diarrhea or loose stools.   30 tablet   0   . promethazine (PHENERGAN) 25 MG tablet   Oral   Take 1 tablet (25 mg total) by mouth every 6 (six) hours as needed for nausea.   15 tablet   0     BP 128/80  Pulse 110  Temp(Src) 99.8 F (37.7 C) (Oral)  Resp 20  SpO2 98%  Physical Exam  Nursing note and vitals reviewed. Constitutional: He is oriented to person, place, and time. He appears well-developed and well-nourished. No distress.  HENT:  Head: Normocephalic and atraumatic.  Eyes: EOM are normal.  Neck: Neck supple.  Cardiovascular: Tachycardia present.   Pulmonary/Chest: Effort normal.  Musculoskeletal:       Left foot: He exhibits decreased range of motion, tenderness, bony tenderness and swelling. He exhibits normal capillary refill, no deformity and no laceration.       Feet:  Swelling noted to right foot and ankle. Pain with  palpation and range of motion. Pedal pulses strong and equal bilateral. Good touch sensation.  Neurological: He is alert and oriented to person, place, and time. He has normal strength. No cranial nerve deficit or sensory deficit.  Pedal pulses strong and equal bilateral. Adequate circulation.  Skin: Skin is warm and dry.  Psychiatric: He has a normal mood and affect.    ED Course  Procedures (including critical care time)  Dg Ankle Complete Right  07/07/2012   *RADIOLOGY REPORT*  Clinical Data: Right ankle pain/swelling  RIGHT ANKLE - COMPLETE 3+ VIEW  Comparison: None.  Findings: Linear osseous density along the dorsal aspect of the navicular on the lateral view.  Correlate for point tenderness to exclude a tiny  avulsion fracture.  The ankle mortise is otherwise intact.  The base of the fifth metatarsal is unremarkable.  Mild dorsolateral soft tissue swelling.  IMPRESSION: Possible avulsion fracture along the dorsal aspect of the navicular, equivocal.  Correlate for point tenderness.  Mild dorsolateral soft tissue swelling.   Original Report Authenticated By: Charline Bills, M.D.   MDM  37 y.o. male with fracture of navicular right foot. Discussed with Dr. Preston Fleeting. Will place patient in Cam Walker and give ibuprofen for pain. He is to ice the area, elevate and follow up with ortho. No signs of compartment syndrome at this time. Patient stable for discharge home without immediate complications.    Medication List    TAKE these medications       ibuprofen 600 MG tablet  Commonly known as:  ADVIL,MOTRIN  Take 1 tablet (600 mg total) by mouth every 6 (six) hours as needed for pain.               [facility] Orlene Och, NP 07/07/12 1800

## 2012-07-11 ENCOUNTER — Encounter (HOSPITAL_COMMUNITY): Payer: Self-pay | Admitting: *Deleted

## 2012-07-11 ENCOUNTER — Emergency Department (HOSPITAL_COMMUNITY)
Admission: EM | Admit: 2012-07-11 | Discharge: 2012-07-11 | Disposition: A | Discharge status: Home / Self Care | Payer: Self-pay | Attending: Emergency Medicine | Admitting: Emergency Medicine

## 2012-07-11 ENCOUNTER — Emergency Department (HOSPITAL_COMMUNITY): Payer: Self-pay

## 2012-07-11 DIAGNOSIS — M25579 Pain in unspecified ankle and joints of unspecified foot: Secondary | ICD-10-CM | POA: Insufficient documentation

## 2012-07-11 DIAGNOSIS — G8911 Acute pain due to trauma: Secondary | ICD-10-CM | POA: Insufficient documentation

## 2012-07-11 DIAGNOSIS — S92251D Displaced fracture of navicular [scaphoid] of right foot, subsequent encounter for fracture with routine healing: Secondary | ICD-10-CM

## 2012-07-11 MED ORDER — TRAMADOL HCL 50 MG PO TABS
50.0000 mg | ORAL_TABLET | Freq: Once | ORAL | Status: AC
  Administered 2012-07-11: 50 mg via ORAL
  Filled 2012-07-11: qty 1

## 2012-07-11 MED ORDER — TRAMADOL HCL 50 MG PO TABS: 50.0000 mg | ORAL_TABLET | Freq: Four times a day (QID) | ORAL | Status: DC | PRN

## 2012-07-11 NOTE — ED Provider Notes (Signed)
History     CSN: [id number]  Arrival date & time 07/11/12  0408   First MD Initiated Contact with Patient 07/11/12 0441      Chief Complaint  Patient presents with  . Foot Pain   HPI Paul Lambert is a 37 y.o. male patient was seen in emergency department on May 31 diagnosed with an avulsion fracture of the navicular bone of the right foot. He says that ibuprofen has not manage the pain. He says he's got continued pain, throbbing, moderate to severe, or numbness or tingling. Patient says he has not followed up for a set up a followup appointment with Dr. Romeo Apple as he was instructed. Patient says he's not been wearing the Cam Walker boot either. Admits to drinking alcohol tonight.  History reviewed. No pertinent past medical history.  Past Surgical History  Procedure Laterality Date  . Facial cosmetic surgery      History reviewed. No pertinent family history.  History  Substance Use Topics  . Smoking status: Passive Smoke Exposure - Never Smoker  . Smokeless tobacco: Not on file  . Alcohol Use: Yes     Comment: twice a week.       Review of Systems At least 10pt or greater review of systems completed and are negative except where specified in the HPI.  Allergies  Onion  Home Medications   Current Outpatient Rx  Name  Route  Sig  Dispense  Refill  . ibuprofen (ADVIL,MOTRIN) 600 MG tablet   Oral   Take 1 tablet (600 mg total) by mouth every 6 (six) hours as needed for pain.   30 tablet   0   . traMADol (ULTRAM) 50 MG tablet   Oral   Take 1 tablet (50 mg total) by mouth every 6 (six) hours as needed for pain.   15 tablet   0     BP 123/77  Pulse 98  Temp(Src) 98.5 F (36.9 C) (Oral)  Resp 16  SpO2 95%  Physical Exam  Nursing notes reviewed.  Electronic medical record reviewed. VITAL SIGNS:   Filed Vitals:   07/11/12 0421  BP: 123/77  Pulse: 98  Temp: 98.5 F (36.9 C)  TempSrc: Oral  Resp: 16  SpO2: 95%   CONSTITUTIONAL: Awake, oriented,  appears intoxicated with alcohol. Patient sleeping initially. HENT: Atraumatic, normocephalic, oral mucosa pink and moist, airway patent. Nares patent without drainage. External ears normal. EYES: Conjunctiva clear, EOMI, PERRLA NECK: Trachea midline, non-tender, supple CARDIOVASCULAR: Normal heart rate, Normal rhythm, No murmurs, rubs, gallops PULMONARY/CHEST: Clear to auscultation, no rhonchi, wheezes, or rales. Symmetrical breath sounds. Non-tender. ABDOMINAL: Non-distended, soft, non-tender - no rebound or guarding.  BS normal. NEUROLOGIC: Non-focal, moving all four extremities, no gross sensory or motor deficits. EXTREMITIES: No clubbing, cyanosis, or edema. Mild swelling of the dorsal aspect in the central region of the right midfoot with some tenderness to palpation. Compartments are soft. Sensations intact distally. Patient is able to dorsiflex and plantar flex the ankle dorsiflex and plantarflex great toe and all other toes. SKIN: Warm, Dry, No erythema, No rash  ED Course  Procedures (including critical care time)  Labs Reviewed - No data to display Dg Foot Complete Right  07/11/2012   *RADIOLOGY REPORT*  Clinical Data: Right-sided foot pain.  RIGHT FOOT COMPLETE - 3+ VIEW  Comparison: Ankle radiograph 07/07/2012.  Findings: Again noted is a tiny ossific fragment adjacent to the dorsal aspect of the navicular bone.  There is also irregularity of the  dorsal aspect of the talus adjacent to the talonavicular joint (unchanged).  No other evidence of new acute displaced fracture, subluxation or dislocation.  IMPRESSION: 1.  Small ossific fragment dorsal to the navicular and may represent sequelae of recent avulsion fracture and is similar to the prior examination.  Correlation for point tenderness in this region is recommended.   Original Report Authenticated By: Trudie Reed, M.D.     1. Avulsion fracture of navicular bone of foot, right, with routine healing, subsequent encounter        MDM  Patient has been noncompliant with treatment regimen for his avulsion fracture.  Repeat x-ray redemonstrates fracture, no new fracture seen, no new injury seen. Compartments are soft, he is neurovascularly intact in the right foot. Patient is clearly intoxicated with alcohol, slurring his speech, is able to ambulate unassisted. Patient is told that if he does not follow medical advice, he may have a poor outcome with results to pain and healing of his fracture. Referred back to Dr. Romeo Apple for followup in a week or 2. Is also told to stay in the boot.         Jones Skene, MD 07/11/12 [phone]

## 2012-07-11 NOTE — ED Notes (Signed)
Pt reports continued pain in right foot.  Pt was seen in department on 5/31 and diagnosed with fracture.  States that the ibuprofen he was discharged with has not managed pain.  Pt states that he has not been wearing support boot he was discharged with and instructed to wear, nor has he called to schedule follow up appointment as recommended.

## 2012-07-11 NOTE — ED Notes (Signed)
Awoke with pain and swelling of right foot. No known injury.  States he has been here before for same pain

## 2012-07-13 ENCOUNTER — Encounter (HOSPITAL_COMMUNITY): Payer: Self-pay

## 2012-07-13 ENCOUNTER — Emergency Department (HOSPITAL_COMMUNITY)
Admission: EM | Admit: 2012-07-13 | Discharge: 2012-07-13 | Disposition: A | Discharge status: Home / Self Care | Payer: Self-pay | Attending: Emergency Medicine | Admitting: Emergency Medicine

## 2012-07-13 DIAGNOSIS — S92251D Displaced fracture of navicular [scaphoid] of right foot, subsequent encounter for fracture with routine healing: Secondary | ICD-10-CM

## 2012-07-13 DIAGNOSIS — M25579 Pain in unspecified ankle and joints of unspecified foot: Secondary | ICD-10-CM | POA: Insufficient documentation

## 2012-07-13 DIAGNOSIS — G8911 Acute pain due to trauma: Secondary | ICD-10-CM | POA: Insufficient documentation

## 2012-07-13 NOTE — ED Notes (Signed)
Instructed on crutches use w/return demonstration.

## 2012-07-13 NOTE — ED Notes (Signed)
Pt states this is the 3rd time he has come d/t pain in right foot, states he has been unable to get his rx filled d/t no money
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
## 2012-07-13 NOTE — ED Notes (Signed)
Patient with no complaints at this time. Respirations even and unlabored. Skin warm/dry. Discharge instructions reviewed with patient at this time. Patient given opportunity to voice concerns/ask questions. Patient discharged at this time and left Emergency Department with steady gait.   

## 2012-11-11 ENCOUNTER — Encounter (HOSPITAL_COMMUNITY): Payer: Self-pay

## 2012-11-11 ENCOUNTER — Emergency Department (HOSPITAL_COMMUNITY): Payer: Self-pay

## 2012-11-11 ENCOUNTER — Emergency Department (HOSPITAL_COMMUNITY)
Admission: EM | Admit: 2012-11-11 | Discharge: 2012-11-11 | Disposition: A | Discharge status: Home / Self Care | Payer: Self-pay | Attending: Emergency Medicine | Admitting: Emergency Medicine

## 2012-11-11 DIAGNOSIS — IMO0002 Reserved for concepts with insufficient information to code with codable children: Secondary | ICD-10-CM | POA: Insufficient documentation

## 2012-11-11 DIAGNOSIS — S4980XA Other specified injuries of shoulder and upper arm, unspecified arm, initial encounter: Secondary | ICD-10-CM | POA: Insufficient documentation

## 2012-11-11 DIAGNOSIS — S46909A Unspecified injury of unspecified muscle, fascia and tendon at shoulder and upper arm level, unspecified arm, initial encounter: Secondary | ICD-10-CM | POA: Insufficient documentation

## 2012-11-11 DIAGNOSIS — S02609A Fracture of mandible, unspecified, initial encounter for closed fracture: Secondary | ICD-10-CM

## 2012-11-11 DIAGNOSIS — Z23 Encounter for immunization: Secondary | ICD-10-CM | POA: Insufficient documentation

## 2012-11-11 DIAGNOSIS — S02650A Fracture of angle of mandible, unspecified side, initial encounter for closed fracture: Secondary | ICD-10-CM | POA: Insufficient documentation

## 2012-11-11 MED ORDER — HYDROCODONE-ACETAMINOPHEN 5-325 MG PO TABS
1.0000 | ORAL_TABLET | Freq: Once | ORAL | Status: AC
  Administered 2012-11-11: 1 via ORAL
  Filled 2012-11-11: qty 1

## 2012-11-11 MED ORDER — HYDROCODONE-ACETAMINOPHEN 5-325 MG PO TABS: 1.0000 | ORAL_TABLET | Freq: Four times a day (QID) | ORAL | Status: DC | PRN

## 2012-11-11 MED ORDER — TETANUS-DIPHTH-ACELL PERTUSSIS 5-2.5-18.5 LF-MCG/0.5 IM SUSP
0.5000 mL | Freq: Once | INTRAMUSCULAR | Status: AC
  Administered 2012-11-11: 0.5 mL via INTRAMUSCULAR
  Filled 2012-11-11: qty 0.5
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
## 2012-12-07 ENCOUNTER — Emergency Department (HOSPITAL_COMMUNITY)
Admission: EM | Admit: 2012-12-07 | Discharge: 2012-12-08 | Disposition: A | Discharge status: Home / Self Care | Payer: Self-pay | Attending: Emergency Medicine | Admitting: Emergency Medicine

## 2012-12-07 ENCOUNTER — Encounter (HOSPITAL_COMMUNITY): Payer: Self-pay | Admitting: Emergency Medicine

## 2012-12-07 DIAGNOSIS — S0990XA Unspecified injury of head, initial encounter: Secondary | ICD-10-CM | POA: Insufficient documentation

## 2012-12-07 DIAGNOSIS — IMO0001 Reserved for inherently not codable concepts without codable children: Secondary | ICD-10-CM | POA: Insufficient documentation

## 2012-12-07 DIAGNOSIS — S02609G Fracture of mandible, unspecified, subsequent encounter for fracture with delayed healing: Secondary | ICD-10-CM

## 2012-12-07 DIAGNOSIS — S0993XA Unspecified injury of face, initial encounter: Secondary | ICD-10-CM | POA: Insufficient documentation

## 2012-12-07 DIAGNOSIS — F1010 Alcohol abuse, uncomplicated: Secondary | ICD-10-CM | POA: Insufficient documentation

## 2012-12-07 DIAGNOSIS — S0590XA Unspecified injury of unspecified eye and orbit, initial encounter: Secondary | ICD-10-CM | POA: Insufficient documentation
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
## 2012-12-07 NOTE — ED Notes (Signed)
Swelling underneath right eye, and above and around the side of left eye. Patient smells of alcohol.

## 2012-12-08 ENCOUNTER — Emergency Department (HOSPITAL_COMMUNITY): Payer: Self-pay

## 2012-12-08 LAB — RAPID URINE DRUG SCREEN, HOSP PERFORMED
Amphetamines: NOT DETECTED
Barbiturates: NOT DETECTED
Benzodiazepines: NOT DETECTED
Tetrahydrocannabinol: NOT DETECTED

## 2012-12-08 LAB — CBC WITH DIFFERENTIAL/PLATELET
Basophils Absolute: 0 10*3/uL (ref 0.0–0.1)
Lymphocytes Relative: 27 % (ref 12–46)
Lymphs Abs: 2.2 10*3/uL (ref 0.7–4.0)
MCV: 92.4 fL (ref 78.0–100.0)
Neutro Abs: 5.2 10*3/uL (ref 1.7–7.7)
Neutrophils Relative %: 66 % (ref 43–77)
Platelets: 229 10*3/uL (ref 150–400)
RBC: 5.02 MIL/uL (ref 4.22–5.81)
RDW: 13.5 % (ref 11.5–15.5)
WBC: 7.9 10*3/uL (ref 4.0–10.5)

## 2012-12-08 LAB — BASIC METABOLIC PANEL
CO2: 24 mEq/L (ref 19–32)
Chloride: 99 mEq/L (ref 96–112)
Glucose, Bld: 86 mg/dL (ref 70–99)
Potassium: 3.6 mEq/L (ref 3.5–5.1)
Sodium: 138 mEq/L (ref 135–145)

## 2012-12-08 LAB — ETHANOL: Alcohol, Ethyl (B): 356 mg/dL — ABNORMAL HIGH (ref 0–11)

## 2012-12-08 MED ORDER — ONDANSETRON HCL 4 MG/2ML IJ SOLN
4.0000 mg | Freq: Once | INTRAMUSCULAR | Status: AC
  Administered 2012-12-08: 4 mg via INTRAVENOUS

## 2012-12-08 MED ORDER — SODIUM CHLORIDE 0.9 % IV BOLUS (SEPSIS)
1000.0000 mL | Freq: Once | INTRAVENOUS | Status: AC
  Administered 2012-12-08: 1000 mL via INTRAVENOUS

## 2012-12-08 MED ORDER — ONDANSETRON HCL 4 MG/2ML IJ SOLN
INTRAMUSCULAR | Status: AC
  Filled 2012-12-08: qty 2
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
## 2013-03-17 ENCOUNTER — Encounter (HOSPITAL_COMMUNITY): Payer: Self-pay | Admitting: Emergency Medicine

## 2013-03-17 ENCOUNTER — Emergency Department (HOSPITAL_COMMUNITY)
Admission: EM | Admit: 2013-03-17 | Discharge: 2013-03-18 | Disposition: A | Discharge status: Other Acute Inpatient Hospital | Payer: Self-pay | Attending: Emergency Medicine | Admitting: Emergency Medicine

## 2013-03-17 DIAGNOSIS — F1210 Cannabis abuse, uncomplicated: Secondary | ICD-10-CM | POA: Insufficient documentation

## 2013-03-17 DIAGNOSIS — F1010 Alcohol abuse, uncomplicated: Secondary | ICD-10-CM | POA: Insufficient documentation

## 2013-03-17 DIAGNOSIS — R000 Tachycardia, unspecified: Secondary | ICD-10-CM | POA: Insufficient documentation

## 2013-03-17 LAB — RAPID URINE DRUG SCREEN, HOSP PERFORMED
Amphetamines: NOT DETECTED
Barbiturates: NOT DETECTED
Benzodiazepines: NOT DETECTED
Cocaine: POSITIVE — AB
Opiates: NOT DETECTED
Tetrahydrocannabinol: NOT DETECTED

## 2013-03-17 LAB — CBC WITH DIFFERENTIAL/PLATELET
Basophils Absolute: 0.1 10*3/uL (ref 0.0–0.1)
Basophils Relative: 1 % (ref 0–1)
EOS ABS: 0 10*3/uL (ref 0.0–0.7)
Eosinophils Relative: 1 % (ref 0–5)
HEMATOCRIT: 43.2 % (ref 39.0–52.0)
HEMOGLOBIN: 14.8 g/dL (ref 13.0–17.0)
Lymphocytes Relative: 26 % (ref 12–46)
Lymphs Abs: 1.6 10*3/uL (ref 0.7–4.0)
MCH: 31.4 pg (ref 26.0–34.0)
MCHC: 34.3 g/dL (ref 30.0–36.0)
MCV: 91.7 fL (ref 78.0–100.0)
MONOS PCT: 6 % (ref 3–12)
Monocytes Absolute: 0.4 10*3/uL (ref 0.1–1.0)
Neutro Abs: 4 10*3/uL (ref 1.7–7.7)
Neutrophils Relative %: 66 % (ref 43–77)
Platelets: 252 10*3/uL (ref 150–400)
RBC: 4.71 MIL/uL (ref 4.22–5.81)
RDW: 12.6 % (ref 11.5–15.5)
WBC: 6 10*3/uL (ref 4.0–10.5)

## 2013-03-17 LAB — COMPREHENSIVE METABOLIC PANEL
ALK PHOS: 68 U/L (ref 39–117)
ALT: 25 U/L (ref 0–53)
AST: 40 U/L — ABNORMAL HIGH (ref 0–37)
Albumin: 3.9 g/dL (ref 3.5–5.2)
BILIRUBIN TOTAL: 0.8 mg/dL (ref 0.3–1.2)
BUN: 10 mg/dL (ref 6–23)
CHLORIDE: 98 meq/L (ref 96–112)
CO2: 23 mEq/L (ref 19–32)
Calcium: 8.6 mg/dL (ref 8.4–10.5)
Creatinine, Ser: 0.94 mg/dL (ref 0.50–1.35)
GFR calc non Af Amer: >90 mL/min (ref >90)
GLUCOSE: 157 mg/dL — AB (ref 70–99)
Potassium: 3.8 mEq/L (ref 3.7–5.3)
Sodium: 138 mEq/L (ref 137–147)
TOTAL PROTEIN: 7.3 g/dL (ref 6.0–8.3)

## 2013-03-17 LAB — ETHANOL: ALCOHOL ETHYL (B): 284 mg/dL — AB (ref 0–11)

## 2013-03-17 MED ORDER — LORAZEPAM 2 MG/ML IJ SOLN
1.0000 mg | Freq: Once | INTRAMUSCULAR | Status: AC
  Administered 2013-03-17: 1 mg via INTRAVENOUS
  Filled 2013-03-17: qty 1

## 2013-03-17 MED ORDER — VITAMIN B-1 100 MG PO TABS
100.0000 mg | ORAL_TABLET | Freq: Every day | ORAL | Status: DC
  Administered 2013-03-17: 100 mg via ORAL
  Filled 2013-03-17: qty 1

## 2013-03-17 MED ORDER — NICOTINE 21 MG/24HR TD PT24
21.0000 mg | MEDICATED_PATCH | Freq: Once | TRANSDERMAL | Status: DC
  Administered 2013-03-17: 21 mg via TRANSDERMAL
  Filled 2013-03-17: qty 1

## 2013-03-17 MED ORDER — LORAZEPAM 1 MG PO TABS: 0.0000 mg | ORAL_TABLET | Freq: Four times a day (QID) | ORAL | Status: DC

## 2013-03-17 MED ORDER — THIAMINE HCL 100 MG/ML IJ SOLN: 100.0000 mg | Freq: Every day | INTRAMUSCULAR | Status: DC

## 2013-03-17 MED ORDER — LORAZEPAM 1 MG PO TABS: 0.0000 mg | ORAL_TABLET | Freq: Two times a day (BID) | ORAL | Status: DC

## 2013-03-17 MED ORDER — FOLIC ACID 1 MG PO TABS
1.0000 mg | ORAL_TABLET | Freq: Every day | ORAL | Status: DC
  Administered 2013-03-17: 1 mg via ORAL
  Filled 2013-03-17: qty 1

## 2013-03-17 MED ORDER — SODIUM CHLORIDE 0.9 % IV BOLUS (SEPSIS)
1000.0000 mL | Freq: Once | INTRAVENOUS | Status: AC
  Administered 2013-03-17: 1000 mL via INTRAVENOUS

## 2013-03-17 MED ORDER — LORAZEPAM 1 MG PO TABS
1.0000 mg | ORAL_TABLET | Freq: Four times a day (QID) | ORAL | Status: DC | PRN
  Administered 2013-03-17: 1 mg via ORAL
  Filled 2013-03-17: qty 1

## 2013-03-17 MED ORDER — LORAZEPAM 2 MG/ML IJ SOLN: 1.0000 mg | Freq: Four times a day (QID) | INTRAMUSCULAR | Status: DC | PRN

## 2013-03-17 MED ORDER — ADULT MULTIVITAMIN W/MINERALS CH
1.0000 | ORAL_TABLET | Freq: Every day | ORAL | Status: DC
  Administered 2013-03-17: 1 via ORAL
  Filled 2013-03-17: qty 1

## 2013-03-17 NOTE — BH Assessment (Signed)
Per Laverle HobbyLuwanda Daniels, [facility] adult unit [facility] at [facility]. Contacted the following facilities for placement:  [facility]- Bed available. Faxed clinical information. [facility]- Bed available. Faxed clinical information.   Harlin RainFord Ellis Ria CommentWarrick Jr, LPC, [facility] Triage Specialist

## 2013-03-17 NOTE — ED Notes (Signed)
[facility] Stated that patient will be accepted to [facility] in [place] after his blood alcohol level is below 140. Bed is being held for the patient at [facility].

## 2013-03-17 NOTE — BH Assessment (Signed)
Tele Assessment Note   Paul Lambert is an 38 y.o. male presents to [facility] voluntarily for alcohol detox. Pt reported being stressed out about relationship issues.  Pt reported "I just want to stop."  Pt stated he had drank earlier in the day. He reported drinking about 2-3 cases of beer, daily since he was 38 y/o.  Pt also reported occasional marijuana use, most recently being 2 days ago.  Pt reported experiencing withdrawal symptoms such as cramps, agitation, weakness, sweats, chills, heart palpitations,  shakiness, and irritability.   Pt reported experiencing black outs "for a while" and reported most recent was 03/16/13. When prompted about hallucinations, Pt reported seeing light spots when intoxicated and when not intoxicated.  Pt denied seizures.  Pt reported mood was upset and sad WNL for affect. Pt was cooperative and calm during assessment.    Pt denied SI, HI, auditory hallucinations and delusions.  Pt reported one inpatient detox in "the 90s" at a facility in [place], [place].  Pt denied outpatient services. Pt denied being on any meds.  Pt reports currently living with his aunt. Pt denied having family supports. Pt reports he is not working currently.   Axis I: Alcohol Use Disorder, Severe Axis II: Deferred Axis III: History reviewed. No pertinent past medical history. Axis IV: problems with primary support group Axis V: 50  Past Medical History: History reviewed. No pertinent past medical history.  Past Surgical History  Procedure Laterality Date  . Facial cosmetic surgery      Family History: No family history on file.  Social History:  reports that he has been passively smoking.  He does not have any smokeless tobacco history on file. He reports that he drinks alcohol. He reports that he uses illicit drugs (Marijuana).  Additional Social History:  Alcohol / Drug Use Pain Medications: none reported  Prescriptions: none reported  Over the Counter: none reported History of alcohol /  drug use?: Yes Longest period of sobriety (when/how long): unk Negative Consequences of Use: Personal relationships Withdrawal Symptoms: Blackouts;Agitation;Cramps;Fever / Chills;Irritability;Nausea / Vomiting;Sweats;Tremors;Weakness Substance #1 Name of Substance 1: ETOH 1 - Age of First Use: 16 1 - Amount (size/oz): 2-3 cases of beer 1 - Frequency: daily 1 - Duration: ongoing 1 - Last Use / Amount: 03/17/13 Substance #2 Name of Substance 2: marijuana 2 - Age of First Use: 16 2 - Amount (size/oz): shared a blunt 2 - Frequency: occasionally 2 - Duration: ongoing 2 - Last Use / Amount: 03/15/12  CIWA: CIWA-Ar BP: 152/97 mmHg Pulse Rate: 136 Nausea and Vomiting: mild nausea with no vomiting Tactile Disturbances: very mild itching, pins and needles, burning or numbness Tremor: three Auditory Disturbances: not present Paroxysmal Sweats: barely perceptible sweating, palms moist Visual Disturbances: very mild sensitivity Anxiety: two Headache, Fullness in Head: very mild Agitation: normal activity Orientation and Clouding of Sensorium: oriented and can do serial additions CIWA-Ar Total: 10 COWS:    Allergies:  Allergies  Allergen Reactions  . Onion Anaphylaxis    OTHER: lettuce.    Home Medications:  (Not in a hospital admission)  OB/GYN Status:  No LMP for male patient.  General Assessment Data Location of Assessment: [facility] Is this a Tele or Face-to-Face Assessment?: Tele Assessment Is this an Initial Assessment or a Re-assessment for this encounter?: Initial Assessment Living Arrangements: Other relatives Can pt return to current living arrangement?: Yes Admission Status: Voluntary Is patient capable of signing voluntary admission?: Yes Transfer from: Acute Hospital Referral Source: Self/Family/Friend  Medical Screening Exam [facility] Walk-in ONLY) Medical Exam completed:  (NA)  [facility] Crisis Care Plan Living Arrangements: Other relatives Name of Psychiatrist:   (NA) Name of Therapist:  (NA)     Risk to self Suicidal Ideation: No Suicidal Intent: No Is patient at risk for suicide?: No Suicidal Plan?: No Access to Means: No What has been your use of drugs/alcohol within the last 12 months?:  (ETOH and marijuana) Previous Attempts/Gestures: No How many times?: 0 Other Self Harm Risks:  (none reported) Triggers for Past Attempts: None known Intentional Self Injurious Behavior: None Family Suicide History: Unknown Recent stressful life event(s): Other (Comment) (Relationship problems) Persecutory voices/beliefs?: No Depression: Yes Depression Symptoms: Insomnia;Tearfulness;Isolating;Fatigue;Guilt;Loss of interest in usual pleasures;Feeling angry/irritable Substance abuse history and/or treatment for substance abuse?: Yes Suicide prevention information given to non-admitted patients: Not applicable  Risk to Others Homicidal Ideation: No Thoughts of Harm to Others: No Current Homicidal Intent: No Current Homicidal Plan: No Access to Homicidal Means: No Identified Victim:  (none reported) History of harm to others?: No Assessment of Violence: None Noted Violent Behavior Description:  (Pt was calm and cooperative. ) Does patient have access to weapons?: No Criminal Charges Pending?: No Does patient have a court date: No  Psychosis Hallucinations: Visual Delusions: None noted  Mental Status Report Appear/Hygiene: Other (Comment) (Pt was wearing paper scrubs.) Eye Contact: Good Motor Activity: Unremarkable Speech: Logical/coherent Level of Consciousness: Alert Mood: Sad Affect: Sad Anxiety Level: None Thought Processes: Coherent;Relevant Judgement: Impaired Orientation: Person;Place;Time;Situation Obsessive Compulsive Thoughts/Behaviors: None  Cognitive Functioning Concentration: Normal Memory: Recent Intact;Remote Intact IQ: Average Insight: Fair Impulse Control: Poor Weight Loss: 0 Weight Gain: 0 Sleep:  Decreased Total Hours of Sleep:  (unk) Vegetative Symptoms: None  ADLScreening [facility] Assessment Services) Patient's cognitive ability adequate to safely complete daily activities?: Yes Patient able to express need for assistance with ADLs?: Yes Independently performs ADLs?: Yes (appropriate for developmental age)  Prior Inpatient Therapy Prior Inpatient Therapy: Yes Prior Therapy Dates:  (Pt reported in the "90s.") Prior Therapy Facilty/Provider(s):  (Pt [place] a facility in [place], [place] Reason for Treatment: Detox  Prior Outpatient Therapy Prior Outpatient Therapy: No Prior Therapy Dates:  (NA) Prior Therapy Facilty/Provider(s):  (NA) Reason for Treatment:  (NA)  ADL Screening (condition at time of admission) Patient's cognitive ability adequate to safely complete daily activities?: Yes Is the patient deaf or have difficulty hearing?: No Does the patient have difficulty seeing, even when wearing glasses/contacts?: No Does the patient have difficulty concentrating, remembering, or making decisions?: No Patient able to express need for assistance with ADLs?: Yes Does the patient have difficulty dressing or bathing?: No Independently performs ADLs?: Yes (appropriate for developmental age)       Abuse/Neglect Assessment (Assessment to be complete while patient is alone) Physical Abuse: Denies Verbal Abuse: Denies Sexual Abuse: Denies Exploitation of patient/patient's resources: Denies Self-Neglect: Denies Values / Beliefs Cultural Requests During Hospitalization: None Spiritual Requests During Hospitalization: None   Advance Directives (For Healthcare) Advance Directive: Patient does not have advance directive    Additional Information 1:1 In Past 12 Months?: No CIRT Risk: No Elopement Risk: No Does patient have medical clearance?: Yes    Disposition: Clinician consulted with Alberteen Sam NP who states Pt meets criteria for inpatient admission for detox. Laverle Hobby, [facility] reported no available beds. [facility] will look for placement at alternative facilities.  Yaakov Guthrie, MSW, LCSW Triage Specialist [phone]   Disposition Initial Assessment Completed for this Encounter: Yes Disposition of Patient: Inpatient  treatment program Type of inpatient treatment program: Adult  Alric Quan 03/17/2013 8:44 PM

## 2013-03-17 NOTE — ED Provider Notes (Signed)
CSN: [id number]     Arrival date & time 03/17/13  1731 History  This chart was scribed for Glynn Octave, MD by Dorothey Baseman, ED Scribe. This patient was seen in room [facility] and the patient's care was started at 6:04 PM.    Chief Complaint  Patient presents with  . V70.1   The history is provided by the patient. No language interpreter was used.   HPI Comments: Paul Lambert is a 38 y.o. male who presents to the Emergency Department requesting medical clearance for alcohol detoxification. Patient states that he drinks "too much," usually about 10-20 beers daily, but patient is unable to better specify the quantity. Patient admits to drinking today, last drink was around 6 hours ago. He admits to using marijuana, but denies any other drug use. He reports some associated tremors for the past 2 days. Patient also reports "seeing spots," but denies hallucinations. He denies seizures, dizziness, lightheadedness, emesis, headaches. He denies suicidal or homicidal ideations. Patient has no other pertinent medical history.   History reviewed. No pertinent past medical history. Past Surgical History  Procedure Laterality Date  . Facial cosmetic surgery     No family history on file. History  Substance Use Topics  . Smoking status: Passive Smoke Exposure - Never Smoker  . Smokeless tobacco: Not on file  . Alcohol Use: Yes     Comment: "alot"     Review of Systems  A complete 10 system review of systems was obtained and all systems are negative except as noted in the HPI and PMH.   Allergies  Onion  Home Medications   No current outpatient prescriptions on file. Triage Vitals: BP 152/97  Pulse 136  Temp(Src) 99.1 F (37.3 C) (Oral)  Resp 20  Ht 5\' 9"  (1.753 m)  Wt 170 lb (77.111 kg)  BMI 25.09 kg/m2  SpO2 94%  Physical Exam  Nursing note and vitals reviewed. Constitutional: He is oriented to person, place, and time. He appears well-developed and well-nourished. No distress.   Patient smells of alcohol.   HENT:  Head: Normocephalic and atraumatic.  Eyes: Conjunctivae and EOM are normal.  Neck: Normal range of motion. Neck supple.  Cardiovascular: Regular rhythm and normal heart sounds.   Tachycardic.   Pulmonary/Chest: Effort normal and breath sounds normal. No respiratory distress.  Abdominal: He exhibits no distension.  Musculoskeletal: Normal range of motion.  Neurological: He is alert and oriented to person, place, and time.  CN 2-12 intact, no ataxia on finger to nose, no nystagmus, 5/5 strength throughout, no pronator drift, Romberg negative, normal gait.   Skin: Skin is warm and dry.  Psychiatric: He has a normal mood and affect. His behavior is normal.    ED Course  Procedures (including critical care time)  DIAGNOSTIC STUDIES: Oxygen Saturation is 94% on room air, adequate by my interpretation.    COORDINATION OF CARE: 6:08 PM- Will order blood labs. Will order IV fluids, Ativan, Vitamin B-1, thiamine, Folvite to manage symptoms. Discussed treatment plan with patient at bedside and patient verbalized agreement.     Labs Review Labs Reviewed  COMPREHENSIVE METABOLIC PANEL - Abnormal; Notable for the following:    Glucose, Bld 157 (*)    AST 40 (*)    All other components within normal limits  ETHANOL - Abnormal; Notable for the following:    Alcohol, Ethyl (B) 284 (*)    All other components within normal limits  URINE RAPID DRUG SCREEN (HOSP PERFORMED) - Abnormal; Notable  for the following:    Cocaine POSITIVE (*)    All other components within normal limits  CBC WITH DIFFERENTIAL   Imaging Review No results found.  EKG Interpretation    Date/Time:  Sunday March 17 2013 19:20:10 EST Ventricular Rate:  103 PR Interval:  136 QRS Duration: 90 QT Interval:  364 QTC Calculation: 476 R Axis:   71 Text Interpretation:  Sinus tachycardia with frequent Premature ventricular complexes Otherwise normal ECG No previous ECGs available  No previous ECGs available Confirmed by Manus GunningANCOUR  MD, Jantzen Pilger ([id number]) on 03/17/2013 7:36:54 PM            MDM  No diagnosis found. Patient requesting detoxification from alcohol. He has difficulty quantifying how much he drinks. Denies any suicidal or homicidal thoughts. Denies any history of seizures. Denies any illicit drug use.  Screening labs unremarkable. Patient given IV fluids and heart rate has decreased to the 90s.  Holding orders placed. Alcohol withdrawal orders placed. Patient is medically clear for psychiatric evaluation.  BP 121/68  Pulse 95  Temp(Src) 99.1 F (37.3 C) (Oral)  Resp 20  Ht 5\' 9"  (1.753 m)  Wt 170 lb (77.111 kg)  BMI 25.09 kg/m2  SpO2 97%   I personally performed the services described in this documentation, which was scribed in my presence. The recorded information has been reviewed and is accurate.     Glynn OctaveStephen Anays [place], MD 03/17/13 [phone]

## 2013-03-17 NOTE — ED Notes (Signed)
Patient sleeping/soring. Oxygen saturation on room air reading 87%. Patient placed on 2 liters of oxygen via nasal canula, oxygen saturation up to 98%.

## 2013-03-17 NOTE — BH Assessment (Signed)
Received call from [facility]. They will accept Pt when his BAL<140. Notified Kathlene CoteJamie Armstrong, RN of situation.  Harlin RainFord Ellis Ria CommentWarrick Jr, LPC, [facility] Triage Specialist

## 2013-03-17 NOTE — BH Assessment (Signed)
Clinician consulted with Alberteen SamFran Hobson NP who states Pt meets criteria for inpatient admission for detox. Laverle HobbyLuwanda Daniels, [facility]. T[facility] will look for placement at alternative facilities. Clinician contacted Dr. Manus Gunningancour to provide recommendation.    Yaakov Guthrieelilah Stewart, MSW, LCSW Triage Specialist [phone]

## 2013-03-17 NOTE — BH Assessment (Signed)
Clinician contacted Dr. Manus Gunningancour to gather additional information prior to assessment.  Contacted nurse to set up tele-assessment. Assessment to be initiated.   Yaakov Guthrieelilah Stewart, MSW, LCSW Triage Specialist [phone]

## 2013-03-17 NOTE — ED Notes (Signed)
Pt presents to er for further evaluation of detox, pt states "I drink a lot and I am tired of it", when asked about how much he drinks and how long he has been drinking, pt state "alot", denies any SI/HI, denies any  Use of drugs or pills, admits to visual and auditory hallucinations.

## 2013-03-17 NOTE — ED Notes (Signed)
Offered dinner tray. States he is not hungry and ate before he arrived to ED.

## 2013-03-18 LAB — ETHANOL
Alcohol, Ethyl (B): 109 mg/dL — ABNORMAL HIGH (ref 0–11)
Alcohol, Ethyl (B): 195 mg/dL — ABNORMAL HIGH (ref 0–11)

## 2013-03-18 NOTE — ED Notes (Signed)
Spoke with [name] from [facility] in [place]. Patient is accepted to [facility] and Janus Molderdolf stated to call report once [facility] arrives to transport the patient.

## 2013-03-18 NOTE — ED Provider Notes (Signed)
[place] has been accepted at [facility] [place], accepting physician is Dr. Lannette DonathHeaden  Indio Santilli D Draylon Mercadel, MD 03/18/13 [phone]

## 2013-03-18 NOTE — ED Notes (Signed)
TTS consult complete  

## 2013-03-18 NOTE — BH Assessment (Signed)
Received call from [facility] requesting an update on Pt's status. Explained that last BAL>140 and another level has been ordered. Please contact [facility] at [phone] when BAL<140. They are still holding a bed for the patient.  Harlin RainFord Ellis Ria CommentWarrick Jr, LPC, [facility] Triage Specialist

## 2013-03-18 NOTE — ED Notes (Signed)
Pt had nicotine patch on right shoulder, pt took it off and handed it to me.

## 2013-03-18 NOTE — ED Notes (Signed)
[facility] unable to take report at this time. [facility] transportation on way. Pt cooperative and pleasant at this time.

## 2013-08-28 ENCOUNTER — Encounter (HOSPITAL_COMMUNITY): Payer: Self-pay | Admitting: Emergency Medicine

## 2013-08-28 ENCOUNTER — Emergency Department (HOSPITAL_COMMUNITY)
Admission: EM | Admit: 2013-08-28 | Discharge: 2013-08-29 | Disposition: A | Discharge status: Home / Self Care | Payer: Self-pay | Attending: Emergency Medicine | Admitting: Emergency Medicine

## 2013-08-28 ENCOUNTER — Emergency Department (HOSPITAL_COMMUNITY): Payer: Self-pay

## 2013-08-28 DIAGNOSIS — M25512 Pain in left shoulder: Secondary | ICD-10-CM

## 2013-08-28 DIAGNOSIS — S4980XA Other specified injuries of shoulder and upper arm, unspecified arm, initial encounter: Secondary | ICD-10-CM | POA: Insufficient documentation

## 2013-08-28 DIAGNOSIS — S46909A Unspecified injury of unspecified muscle, fascia and tendon at shoulder and upper arm level, unspecified arm, initial encounter: Secondary | ICD-10-CM | POA: Insufficient documentation

## 2013-08-28 MED ORDER — HYDROCODONE-ACETAMINOPHEN 5-325 MG PO TABS
1.0000 | ORAL_TABLET | Freq: Once | ORAL | Status: AC
  Administered 2013-08-28: 1 via ORAL
  Filled 2013-08-28: qty 1

## 2013-08-28 MED ORDER — IBUPROFEN 800 MG PO TABS
800.0000 mg | ORAL_TABLET | Freq: Once | ORAL | Status: AC
  Administered 2013-08-28: 800 mg via ORAL
  Filled 2013-08-28: qty 1
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
## 2013-08-29 MED ORDER — IBUPROFEN 800 MG PO TABS: 800.0000 mg | ORAL_TABLET | Freq: Three times a day (TID) | ORAL | Status: DC

## 2013-08-29 MED ORDER — HYDROCODONE-ACETAMINOPHEN 5-325 MG PO TABS: ORAL_TABLET | ORAL | Status: DC

## 2013-08-29 NOTE — Discharge Instructions (Signed)
Shoulder Pain  The shoulder is the joint that connects your arm to your body. Muscles and band-like tissues that connect bones to muscles (tendons) hold the joint together. Shoulder pain is felt if an injury or medical problem affects one or more parts of the shoulder.  HOME CARE   · Put ice on the sore area.  ¨ Put ice in a plastic bag.  ¨ Place a towel between your skin and the bag.  ¨ Leave the ice on for 15-20 minutes, 03-04 times a day for the first 2 days.  · Stop using cold packs if they do not help with the pain.  · If you were given something to keep your shoulder from moving (sling; shoulder immobilizer), wear it as told. Only take it off to shower or bathe.  · Move your arm as little as possible, but keep your hand moving to prevent puffiness (swelling).  · Squeeze a soft ball or foam pad as much as possible to help prevent swelling.  · Take medicine as told by your doctor.  GET HELP IF:  · You have progressing new pain in your arm, hand, or fingers.  · Your hand or fingers get cold.  · Your medicine does not help lessen your pain.  GET HELP RIGHT AWAY IF:   · Your arm, hand, or fingers are numb or tingling.  · Your arm, hand, or fingers are puffy (swollen), painful, or turn white or blue.  MAKE SURE YOU:   · Understand these instructions.  · Will watch your condition.  · Will get help right away if you are not doing well or get worse.  Document Released: 07/13/2007 Document Revised: 06/10/2013 Document Reviewed: 08/08/2011  [employer]® Patient Information ©2015 [employer]. This information is not intended to replace advice given to you by your health care provider. Make sure you discuss any questions you have with your health care provider.

## 2013-08-29 NOTE — ED Notes (Signed)
Patient given discharge instruction, verbalized understand. Patient ambulatory out of the department.  

## 2013-08-29 NOTE — ED Provider Notes (Signed)
Medical screening examination/treatment/procedure(s) were performed by non-physician practitioner and as supervising physician I was immediately available for consultation/collaboration.    Vida RollerBrian D Sederick Jacobsen, MD 08/29/13 [phone]

## 2013-08-29 NOTE — ED Notes (Signed)
Sling applied, pre pack filled

## 2013-09-06 MED FILL — Hydrocodone-Acetaminophen Tab 5-325 MG: ORAL | Qty: 6 | Status: AC

## 2014-01-18 ENCOUNTER — Encounter (HOSPITAL_COMMUNITY): Payer: Self-pay

## 2014-01-18 DIAGNOSIS — K047 Periapical abscess without sinus: Secondary | ICD-10-CM | POA: Insufficient documentation

## 2014-01-18 DIAGNOSIS — Z791 Long term (current) use of non-steroidal anti-inflammatories (NSAID): Secondary | ICD-10-CM | POA: Insufficient documentation

## 2014-01-18 DIAGNOSIS — Z720 Tobacco use: Secondary | ICD-10-CM | POA: Insufficient documentation

## 2014-01-18 DIAGNOSIS — Z79899 Other long term (current) drug therapy: Secondary | ICD-10-CM | POA: Insufficient documentation

## 2014-01-18 NOTE — ED Notes (Signed)
I have an abscess on my jaw that has been there for 3 days.

## 2014-01-19 ENCOUNTER — Emergency Department (HOSPITAL_COMMUNITY)
Admission: EM | Admit: 2014-01-19 | Discharge: 2014-01-19 | Disposition: A | Discharge status: Home / Self Care | Payer: Self-pay | Attending: Emergency Medicine | Admitting: Emergency Medicine

## 2014-01-19 DIAGNOSIS — K047 Periapical abscess without sinus: Secondary | ICD-10-CM

## 2014-01-19 MED ORDER — HYDROCODONE-ACETAMINOPHEN 5-325 MG PO TABS
2.0000 | ORAL_TABLET | Freq: Once | ORAL | Status: AC
  Administered 2014-01-19: 2 via ORAL
  Filled 2014-01-19: qty 2

## 2014-01-19 MED ORDER — HYDROCODONE-ACETAMINOPHEN 5-325 MG PO TABS
2.0000 | ORAL_TABLET | ORAL | Status: DC | PRN
Start: 2014-01-19 — End: 2014-12-23

## 2014-01-19 MED ORDER — PENICILLIN V POTASSIUM 500 MG PO TABS: 500.0000 mg | ORAL_TABLET | Freq: Three times a day (TID) | ORAL | Status: DC

## 2014-01-19 MED ORDER — PENICILLIN V POTASSIUM 250 MG PO TABS
500.0000 mg | ORAL_TABLET | Freq: Once | ORAL | Status: AC
  Administered 2014-01-19: 500 mg via ORAL
  Filled 2014-01-19: qty 2

## 2014-01-19 NOTE — Discharge Instructions (Signed)
Penicillin as prescribed.  Hydrocodone as prescribed as needed for pain.  Follow-up with a dentist in the next 2-3 days.   Dental Abscess A dental abscess is a collection of infected fluid (pus) from a bacterial infection in the inner part of the tooth (pulp). It usually occurs at the end of the tooth's root.  CAUSES   Severe tooth decay.  Trauma to the tooth that allows bacteria to enter into the pulp, such as a broken or chipped tooth. SYMPTOMS   Severe pain in and around the infected tooth.  Swelling and redness around the abscessed tooth or in the mouth or face.  Tenderness.  Pus drainage.  Bad breath.  Bitter taste in the mouth.  Difficulty swallowing.  Difficulty opening the mouth.  Nausea.  Vomiting.  Chills.  Swollen neck glands. DIAGNOSIS   A medical and dental history will be taken.  An examination will be performed by tapping on the abscessed tooth.  X-rays may be taken of the tooth to identify the abscess. TREATMENT The goal of treatment is to eliminate the infection. You may be prescribed antibiotic medicine to stop the infection from spreading. A root canal may be performed to save the tooth. If the tooth cannot be saved, it may be pulled (extracted) and the abscess may be drained.  HOME CARE INSTRUCTIONS  Only take over-the-counter or prescription medicines for pain, fever, or discomfort as directed by your caregiver.  Rinse your mouth (gargle) often with salt water ( tsp salt in 8 oz [250 ml] of warm water) to relieve pain or swelling.  Do not drive after taking pain medicine (narcotics).  Do not apply heat to the outside of your face.  Return to your dentist for further treatment as directed. SEEK MEDICAL CARE IF:  Your pain is not helped by medicine.  Your pain is getting worse instead of better. SEEK IMMEDIATE MEDICAL CARE IF:  You have a fever or persistent symptoms for more than 2-3 days.  You have a fever and your symptoms  suddenly get worse.  You have chills or a very bad headache.  You have problems breathing or swallowing.  You have trouble opening your mouth.  You have swelling in the neck or around the eye. Document Released: 01/24/2005 Document Revised: 10/19/2011 Document Reviewed: 05/04/2010 [facility] Patient Information 2015 [employer], [place]. This information is not intended to replace advice given to you by your health care provider. Make sure you discuss any questions you have with your health care provider.    Emergency Department Resource Guide 1) Find a Doctor and Pay Out of Pocket Although you won't have to find out who is covered by your insurance plan, it is a good idea to ask around and get recommendations. You will then need to call the office and see if the doctor you have chosen will accept you as a new patient and what types of options they offer for patients who are self-pay. Some doctors offer discounts or will set up payment plans for their patients who do not have insurance, but you will need to ask so you aren't surprised when you get to your appointment.  2) Contact Your Local Health Department Not all health departments have doctors that can see patients for sick visits, but many do, so it is worth a call to see if yours does. If you don't know where your local health department is, you can check in your phone book. The CDC also has a tool to help you  locate your state's health department, and many state websites also have listings of all of their local health departments.  3) Find a Walk-in Clinic If your illness is not likely to be very severe or complicated, you may want to try a walk in clinic. These are popping up all over the country in pharmacies, drugstores, and shopping centers. They're usually staffed by nurse practitioners or physician assistants that have been trained to treat common illnesses and complaints. They're usually fairly quick and inexpensive. However, if you have  serious medical issues or chronic medical problems, these are probably not your best option.  No Primary Care Doctor: - Call Health Connect at  [phone] - they can help you locate a primary care doctor that  accepts your insurance, provides certain services, etc. - Physician Referral Service- [phone]  Chronic Pain Problems: Organization         Address  Phone   Notes  [facility]  [phone] Patients need to be referred by their primary care doctor.   Medication Assistance: Organization         Address  Phone   Notes  [facility] Medication [facility] [address] [place], [place] [address] [phone] --Must be a resident of [place] [facility] [place] -- Must have NO insurance coverage whatsoever (no Medicaid/ Medicare, etc.) -- The pt. MUST have a primary care doctor that directs their care regularly and follows them in the community   MedAssist  [phone]   [employer]  [phone]    Agencies that provide inexpensive medical care: Organization         Address  Phone   Notes  [facility]  [phone]   [facility]    [phone]   [facility] [address] [place], [place] [address] [phone]   [facility] [address], [place] [phone]   [facility]anned Parenthood    [phone]) [phone]   [facility]    [phone]   [facility]  [address], [place] Phone:  [phone], Fax:  [phone] Hours of Operation:  9 am - 6 pm, M-F.  Also accepts Medicaid/Medicare and self-pay.  [facility]  [address], [place] Phone: [phone], Fax: [phone]. Hours of Operation:  8:30 am - 5:30 pm, M-F.  Also accepts Medicaid and self-pay.  [facility] [place] [address], [place] Phone: [phone]   [facility] [address],  [place], [place] [phone], Ext. [phone] Mondays & Thursdays: 7-9 AM.  First 15 patients are seen on a first come, first serve basis.    Medicaid-accepting [facility] Providers:  Organization         Address  Phone   Notes  [facility] [address], [place] [phone] Also accepts self-pay patients.  [facility] [address], [place]  [phone]) [phone]   [facility] [address], [place] [phone]) [phone]   [facility] [address] [place] [address], [place] [phone]) [phone]   Renaye RakersVeita Bland [address], [place]   [phone] Only accepts [place] Access [place] patients after they have their name applied to their card.   Self-Pay (no insurance) in [facility] [place]:  Organization  Address  Phone   Notes  Sickle Cell Patients, [facility] [place] [phone]   [facility] [phone]   [facility] [place], [address], [place] [phone]   [facility]/Dr. Osei-Bonsu  [address] [place] or [address], [place] [phone] Phone number for both [place] and [place] locations is the same.  [facility] [address], [place] [phone]   [facility] [address], [place] or [address] [phone]   [facility] [address], [place] [phone], phone; [phone], fax Sees patients 1st and 3rd Saturday of every month.  Must not qualify for public or private insurance (i.e. Medicaid, Medicare, [place] Health Choice, Veterans' Benefits)  Household income should be no more than 200% of the poverty level The clinic cannot treat you if you are pregnant or think you are pregnant   Sexually transmitted diseases are not treated at the clinic.    Dental Care: Organization         Address  Phone  Notes  [facility] [phone] Accepts children up to age 57 who are enrolled in [place] or [place]; pregnant women with a Medicaid card; and children who have applied for Medicaid or [place] Health Choice, but were declined, whose parents can pay a reduced fee at time of service.  [facility] Department of [facility]  [address], [place] [phone] Accepts children up to age 70 who are enrolled in [place] or [place]; pregnant women with a Medicaid card; and children who have applied for Medicaid or [place] Health Choice, but were declined, whose parents can pay a reduced fee at time of service.  [facility] Access PROGRAM  [place] [phone] Patients are seen by appointment only. Walk-ins are not accepted. [place] will see patients 34 years of age and older. Monday - Tuesday (8am-5pm) Most Wednesdays (8:30-5pm) $30 per visit, cash only  [facility] Adult Dental Access PROGRAM  [address], [facility] [phone] Patients are seen by appointment only. Walk-ins are not accepted. [place] will see patients 19 years of age and older. One Wednesday Evening (Monthly: Volunteer Based).  $30 per visit, cash only  [place]  [phone] for adults; Children under age 68, call Graduate Pediatric Dentistry at [phone]. Children aged 22-14, please call [phone] to request a pediatric application.  Dental services are provided in all areas of dental care including fillings, crowns and bridges, complete and partial dentures, implants, gum treatment, root canals, and extractions. Preventive care is also provided. Treatment is provided to both adults and children. Patients  are selected via a lottery and there is often a waiting list.   [facility] [address] [place]  [phone] [email or web address]   [facility] [address] [place], [place] [phone], Ext. [phone] Second and Fourth Thursday of each month, opens at 6:30 AM; Clinic ends at 9 AM.  Patients are seen on a first-come first-served basis, and a limited number are seen during each clinic.   [facility]  [address] [place], [place]  [place], [place] [phone]   Eligibility Requirements You must have lived in [place], [place], or [place] counties for at least the last three months.   You cannot be eligible for state or federal sponsored [employer], including [employer], [place], or [employer].   You generally cannot be eligible for healthcare insurance through your employer.    How to apply: Eligibility screenings are held every Tuesday and Wednesday afternoon from 1:00 pm until 4:00 pm. You do not need an appointment for the interview!  [facility] [address] [place], [place]   [place]  [place] Department  [place]  [phone]    Behavioral Health Resources in the Community: Intensive Outpatient Programs Organization         Address  Phone  Notes  Bastrop Overton. [address], [place], [place] [phone]   [facility]patient [address] [place], [place]   ADS: [facility] [address], [place], [place]   [place] [address],  [place], [place] or [phone]   Substance Abuse Resources Organization         Address  Phone  Notes  Alcohol [facility] Drug Services  [phone]   [place]  [phone]   [facility] [place]   Chinita Pester  [phone]    Residential & Outpatient Substance Abuse Program  [phone]   Psychological Services Organization         Address  Phone  Notes  [facility]  [place]  [phone]   [place] [address] [place] or [phone]    Mobile Crisis Teams Organization         Address  Phone  Notes  [facility], Mobile Crisis Care Unit  [phone]   [facility]  [address] [place], [place]   [place] Levels [address] [place] [phone]    Self-Help/Support Groups Organization         Address  Phone             Notes  [place]. of [place] - variety of support groups  [place] Call for more information  Narcotics Anonymous (NA), [facility] [address], [employer] [place]  2 meetings at this location   Special educational needs teacher         Address  Phone  Notes  ASAP Residential Treatment [place],    [place]  [phone]   [facility]  [address] [place] [address], [place], [place]   Quentin Reynolds, [place] [phone] Admissions: 8am-3pm M-F  Incentives Substance Short [address],    [place], [place] [phone]   [facility] [address] Jadene Pierini [place], [place]   [facility] [address],  [place], [place]   [facility] - Intensive Outpatient [address], [place], [place]   [facility] [place].,  [place], [place] or [phone]   [facility] (RTS) [address] [place], [place] Accepts Medicaid  [facility] [place] [address]  [place] [phone] Substance Abuse/Addiction Treatment   [facility]  Resources Organization  Address  Phone  Notes  °[facility]  [phone]   °Julie Brannon, PhD [address] [place], [place]   ([phone] or ([phone]   ° Behavioral   [address] °[place], [employer] [phone]   °[facility] [address], [place], [place] [phone] Insurance/Medicaid/sponsorship through [facility]  °[facility] [address]                                    [place], [place] [phone] Therapy/tele-psych/case  °[facility] [address]  ° [place], [place] [phone]    °Dr. Arfeen  [phone]   °[facility] [place]  [employer] [place] [facility] 1) [address],  °2) [address], [place] °3)  [address], [place] [phone]) [phone] °([phone] ° °([phone]) [phone]   °[place] Child Abuse Hotline (336) 342-1394 or (336) [phone] (After Hours)    ° ° ° ° °

## 2014-01-19 NOTE — ED Provider Notes (Signed)
CSN: [id number]     Arrival date & time 01/18/14  2332 History   First MD Initiated Contact with Patient 01/19/14 0126     Chief Complaint  Patient presents with  . Dental Problem     (Consider location/radiation/quality/duration/timing/severity/associated sxs/prior Treatment) Patient is a 38 y.o. male presenting with tooth pain. The history is provided by the patient.  Dental Pain Location:  Lower Lower teeth location:  32/RL 3rd molar Quality:  Throbbing Severity:  Moderate Onset quality:  Gradual Duration:  3 days Timing:  Constant Progression:  Worsening Chronicity:  New Context: abscess   Relieved by:  Nothing Worsened by:  Nothing tried Ineffective treatments:  None tried Associated symptoms: no drooling and no fever     History reviewed. No pertinent past medical history. Past Surgical History  Procedure Laterality Date  . Facial cosmetic surgery     History reviewed. No pertinent family history. History  Substance Use Topics  . Smoking status: Current Every Day Smoker  . Smokeless tobacco: Not on file  . Alcohol Use: Yes     Comment: "alot"     Review of Systems  Constitutional: Negative for fever.  HENT: Negative for drooling.   All other systems reviewed and are negative.     Allergies  Onion  Home Medications   Prior to Admission medications   Medication Sig Start Date End Date Taking? Authorizing Provider  HYDROcodone-acetaminophen (NORCO/VICODIN) 5-325 MG per tablet Take one-two tabs po q 4-6 hrs prn pain 08/29/13   Tammy L. Triplett, PA-C  HYDROcodone-acetaminophen (NORCO/VICODIN) 5-325 MG per tablet Take one-two tabs po q 4-6 hrs prn pain 08/29/13   Tammy L. Triplett, PA-C  ibuprofen (ADVIL,MOTRIN) 800 MG tablet Take 1 tablet (800 mg total) by mouth 3 (three) times daily. 08/29/13   Tammy L. Triplett, PA-C   BP 133/92 mmHg  Pulse 65  Temp(Src) 98.1 F (36.7 C) (Oral)  Resp 18  Ht 5\' 9"  (1.753 m)  Wt 165 lb (74.844 kg)  BMI 24.36 kg/m2   SpO2 100% Physical Exam  Constitutional: He is oriented to person, place, and time. He appears well-developed and well-nourished. No distress.  HENT:  Head: Normocephalic and atraumatic.  There is swelling and tenderness to palpation to the right lower jaw. He has what appears to be a partially impacted wisdom tooth on the right lower jaw that I suspect is the bad tooth. There is no swelling or crepitus to the submental space.  Neck: Normal range of motion. Neck supple.  Lymphadenopathy:    He has no cervical adenopathy.  Neurological: He is alert and oriented to person, place, and time.  Skin: Skin is warm and dry. He is not diaphoretic.  Nursing note and vitals reviewed.   ED Course  Procedures (including critical care time) Labs Review Labs Reviewed - No data to display  Imaging Review No results found.   EKG Interpretation None      MDM   Final diagnoses:  None    We'll treat with antibiotics, pain medication, and follow-up with dentistry.    Geoffery Lyonsouglas Brittish Bolinger, MD 01/19/14 [phone]

## 2014-04-14 ENCOUNTER — Encounter (HOSPITAL_COMMUNITY): Payer: Self-pay | Admitting: Emergency Medicine

## 2014-04-14 ENCOUNTER — Emergency Department (HOSPITAL_COMMUNITY)
Admission: EM | Admit: 2014-04-14 | Discharge: 2014-04-14 | Disposition: A | Discharge status: Home / Self Care | Payer: Self-pay | Attending: Emergency Medicine | Admitting: Emergency Medicine

## 2014-04-14 DIAGNOSIS — K029 Dental caries, unspecified: Secondary | ICD-10-CM | POA: Insufficient documentation

## 2014-04-14 DIAGNOSIS — Z792 Long term (current) use of antibiotics: Secondary | ICD-10-CM | POA: Insufficient documentation

## 2014-04-14 DIAGNOSIS — Z79899 Other long term (current) drug therapy: Secondary | ICD-10-CM | POA: Insufficient documentation

## 2014-04-14 DIAGNOSIS — K047 Periapical abscess without sinus: Secondary | ICD-10-CM

## 2014-04-14 DIAGNOSIS — Z720 Tobacco use: Secondary | ICD-10-CM | POA: Insufficient documentation

## 2014-04-14 MED ORDER — AMOXICILLIN 500 MG PO CAPS: 500.0000 mg | ORAL_CAPSULE | Freq: Three times a day (TID) | ORAL | Status: AC

## 2014-04-14 MED ORDER — TRAMADOL HCL 50 MG PO TABS: 50.0000 mg | ORAL_TABLET | Freq: Four times a day (QID) | ORAL | Status: DC | PRN

## 2014-04-14 NOTE — ED Notes (Addendum)
Pt reports right lower dental pain x2 days. No oral swelling noted in triage. Airway patent.

## 2014-04-14 NOTE — ED Notes (Signed)
J. Idol, PA at bedside. 

## 2014-04-14 NOTE — ED Provider Notes (Signed)
CSN: [id number]     Arrival date & time 04/14/14  0927 History  This chart was scribed for non-physician practitioner, Burgess AmorJulie Jhoselin Crume, PA-C working with Benjiman CoreNathan Pickering, MD, by Jarvis Morganaylor Ferguson, ED Scribe. This patient was seen in room APFT22/APFT22 and the patient's care was started at 11:05 AM.    Chief Complaint  Patient presents with  . Dental Pain    The history is provided by the patient. No language interpreter was used.    HPI Comments: Paul Lambert is a 39 y.o. male with no PMH who presents to the Emergency Department complaining of pain in his right lower tooth that has gradually gotten worse over the past 2 days. He has been having associated subjective fever. His last episode of a fever was 2 days ago. Pt states that he needs to get the tooth pulled because it intermittently will swell up and cause him pain. Pt has had to have several teeth in that area pulled in the past. He denies any facial swelling, sore throat, SOB, neck pain or neck stiffness. He has found no alleviators - ibuprofen has not been helpful.    History reviewed. No pertinent past medical history. Past Surgical History  Procedure Laterality Date  . Facial cosmetic surgery     History reviewed. No pertinent family history. History  Substance Use Topics  . Smoking status: Light Tobacco Smoker  . Smokeless tobacco: Not on file  . Alcohol Use: No     Comment: rare    Review of Systems  Constitutional: Positive for fever (subjective).  HENT: Positive for dental problem. Negative for facial swelling and sore throat.   Respiratory: Negative for shortness of breath.   Musculoskeletal: Negative for neck pain and neck stiffness.      Allergies  Onion  Home Medications   Prior to Admission medications   Medication Sig Start Date End Date Taking? Authorizing Provider  amoxicillin (AMOXIL) 500 MG capsule Take 1 capsule (500 mg total) by mouth 3 (three) times daily. 04/14/14 04/24/14  Burgess AmorJulie Sacred Roa, PA-C   HYDROcodone-acetaminophen (NORCO) 5-325 MG per tablet Take 2 tablets by mouth every 4 (four) hours as needed. 01/19/14   Geoffery Lyonsouglas Delo, MD  ibuprofen (ADVIL,MOTRIN) 800 MG tablet Take 1 tablet (800 mg total) by mouth 3 (three) times daily. 08/29/13   Tammi Triplett, PA-C  penicillin v potassium (VEETID) 500 MG tablet Take 1 tablet (500 mg total) by mouth 3 (three) times daily. 01/19/14   Geoffery Lyonsouglas Delo, MD  traMADol (ULTRAM) 50 MG tablet Take 1 tablet (50 mg total) by mouth every 6 (six) hours as needed. 04/14/14   Burgess AmorJulie Mekhi Sonn, PA-C   Triage Vitals: BP 119/83 mmHg  Pulse 79  Temp(Src) 98.6 F (37 C) (Oral)  Resp 18  Ht 5\' 9"  (1.753 m)  Wt 170 lb (77.111 kg)  BMI 25.09 kg/m2  SpO2 100%  Physical Exam  Constitutional: He is oriented to person, place, and time. He appears well-developed and well-nourished. No distress.  HENT:  Head: Normocephalic and atraumatic.  Right Ear: Tympanic membrane and external ear normal.  Left Ear: Tympanic membrane and external ear normal.  Mouth/Throat: Oropharynx is clear and moist and mucous membranes are normal. No oral lesions. No trismus in the jaw. Dental abscesses present.  Right lower 2nd molar has large cavity and there is some mild surrounding gingival edema w/o fluctuance. His 3rd molar is pushing against the 2nd molar. Sublingual space is soft  Eyes: Conjunctivae are normal.  Neck: Normal range of  motion. Neck supple.  Cardiovascular: Normal rate and normal heart sounds.   Pulmonary/Chest: Effort normal.  Abdominal: He exhibits no distension.  Musculoskeletal: Normal range of motion.  Lymphadenopathy:    He has no cervical adenopathy.  Neurological: He is alert and oriented to person, place, and time.  Skin: Skin is warm and dry. No erythema.  Psychiatric: He has a normal mood and affect.    ED Course  Procedures (including critical care time)  DIAGNOSTIC STUDIES: Oxygen Saturation is 100% on RA, normal by my interpretation.    COORDINATION  OF CARE: 11:24 AM- Will d/c pt with Tramadol and amoxicillin. Advised him to follow up with a dental clinic or the health dept to get that tooth pulled. Pt advised of plan for treatment and pt agrees.     Labs Review Labs Reviewed - No data to display  Imaging Review No results found.   EKG Interpretation None      MDM   Final diagnoses:  Dental abscess    Amoxil, tramadol. Referral to dentistry, referrals given.   The patient appears reasonably screened and/or stabilized for discharge and I doubt any other medical condition or other [facility] requiring further screening, evaluation, or treatment in the ED at this time prior to discharge.   I personally performed the services described in this documentation, which was scribed in my presence. The recorded information has been reviewed and is accurate.   Burgess Amor, PA-C 04/16/14 1439  Benjiman Core, MD 04/16/14 [phone]

## 2014-04-14 NOTE — Discharge Instructions (Signed)

## 2014-04-28 ENCOUNTER — Encounter (HOSPITAL_COMMUNITY): Payer: Self-pay | Admitting: Emergency Medicine

## 2014-04-28 ENCOUNTER — Emergency Department (HOSPITAL_COMMUNITY): Payer: Self-pay

## 2014-04-28 ENCOUNTER — Emergency Department (HOSPITAL_COMMUNITY)
Admission: EM | Admit: 2014-04-28 | Discharge: 2014-04-28 | Disposition: A | Discharge status: Home / Self Care | Payer: Self-pay | Attending: Emergency Medicine | Admitting: Emergency Medicine

## 2014-04-28 DIAGNOSIS — Z792 Long term (current) use of antibiotics: Secondary | ICD-10-CM | POA: Insufficient documentation

## 2014-04-28 DIAGNOSIS — Z79899 Other long term (current) drug therapy: Secondary | ICD-10-CM | POA: Insufficient documentation

## 2014-04-28 DIAGNOSIS — Y9289 Other specified places as the place of occurrence of the external cause: Secondary | ICD-10-CM | POA: Insufficient documentation

## 2014-04-28 DIAGNOSIS — S60222A Contusion of left hand, initial encounter: Secondary | ICD-10-CM | POA: Insufficient documentation

## 2014-04-28 DIAGNOSIS — M779 Enthesopathy, unspecified: Secondary | ICD-10-CM | POA: Insufficient documentation

## 2014-04-28 DIAGNOSIS — M778 Other enthesopathies, not elsewhere classified: Secondary | ICD-10-CM

## 2014-04-28 DIAGNOSIS — W228XXA Striking against or struck by other objects, initial encounter: Secondary | ICD-10-CM | POA: Insufficient documentation

## 2014-04-28 DIAGNOSIS — Y9389 Activity, other specified: Secondary | ICD-10-CM | POA: Insufficient documentation

## 2014-04-28 DIAGNOSIS — Y990 Civilian activity done for income or pay: Secondary | ICD-10-CM | POA: Insufficient documentation

## 2014-04-28 MED ORDER — KETOROLAC TROMETHAMINE 10 MG PO TABS
10.0000 mg | ORAL_TABLET | Freq: Once | ORAL | Status: AC
  Administered 2014-04-28: 10 mg via ORAL
  Filled 2014-04-28: qty 1

## 2014-04-28 MED ORDER — DICLOFENAC SODIUM 75 MG PO TBEC: 75.0000 mg | DELAYED_RELEASE_TABLET | Freq: Two times a day (BID) | ORAL | Status: DC

## 2014-04-28 MED ORDER — ACETAMINOPHEN 325 MG PO TABS
650.0000 mg | ORAL_TABLET | Freq: Once | ORAL | Status: AC
  Administered 2014-04-28: 650 mg via ORAL
  Filled 2014-04-28: qty 2

## 2014-04-28 MED ORDER — TRAMADOL HCL 50 MG PO TABS: ORAL_TABLET | ORAL | Status: DC

## 2014-04-28 NOTE — Discharge Instructions (Signed)
Your xrays are negative for new fracture or dislocation. Please use diclofenac 2 times daily with food. Use tramadol for pain if needed. This medication may cause drowsiness. Please do not drink, drive, or participate in activity that requires concentration while taking this medication. See Dr Hilda LiasKeeling for additional evaluation if any changes or problem. Hand Contusion A hand contusion is a deep bruise on your hand area. Contusions are the result of an injury that caused bleeding under the skin. The contusion may turn blue, purple, or yellow. Minor injuries will give you a painless contusion, but more severe contusions may stay painful and swollen for a few weeks. CAUSES  A contusion is usually caused by a blow, trauma, or direct force to an area of the body. SYMPTOMS   Swelling and redness of the injured area.  Discoloration of the injured area.  Tenderness and soreness of the injured area.  Pain. DIAGNOSIS  The diagnosis can be made by taking a history and performing a physical exam. An X-ray, CT scan, or MRI may be needed to determine if there were any associated injuries, such as broken bones (fractures). TREATMENT  Often, the best treatment for a hand contusion is resting, elevating, icing, and applying cold compresses to the injured area. Over-the-counter medicines may also be recommended for pain control. HOME CARE INSTRUCTIONS   Put ice on the injured area.  Put ice in a plastic bag.  Place a towel between your skin and the bag.  Leave the ice on for 15-20 minutes, 03-04 times a day.  Only take over-the-counter or prescription medicines as directed by your caregiver. Your caregiver may recommend avoiding anti-inflammatory medicines (aspirin, ibuprofen, and naproxen) for 48 hours because these medicines may increase bruising.  If told, use an elastic wrap as directed. This can help reduce swelling. You may remove the wrap for sleeping, showering, and bathing. If your fingers  become numb, cold, or blue, take the wrap off and reapply it more loosely.  Elevate your hand with pillows to reduce swelling.  Avoid overusing your hand if it is painful. SEEK IMMEDIATE MEDICAL CARE IF:   You have increased redness, swelling, or pain in your hand.  Your swelling or pain is not relieved with medicines.  You have loss of feeling in your hand or are unable to move your fingers.  Your hand turns cold or blue.  You have pain when you move your fingers.  Your hand becomes warm to the touch.  Your contusion does not improve in 2 days. MAKE SURE YOU:   Understand these instructions.  Will watch your condition.  Will get help right away if you are not doing well or get worse. Document Released: 07/16/2001 Document Revised: 10/19/2011 Document Reviewed: 07/18/2011 [facility] Patient Information 2015 [place], [place]. This information is not intended to replace advice given to you by your health care provider. Make sure you discuss any questions you have with your health care provider.

## 2014-04-28 NOTE — ED Provider Notes (Signed)
CSN: [id number]     Arrival date & time 04/28/14  [id number] History   First MD Initiated Contact with Patient 04/28/14 [phone]     Chief Complaint  Patient presents with  . Hand Pain     (Consider location/radiation/quality/duration/timing/severity/associated sxs/prior Treatment) Patient is a 39 y.o. male presenting with hand pain. The history is provided by the patient.  Hand Pain This is a new problem. The current episode started in the past 7 days. The problem occurs intermittently. The problem has been gradually worsening. Associated symptoms include arthralgias. Pertinent negatives include no abdominal pain, chest pain, coughing, fever, neck pain or numbness. Exacerbated by: flexing and extending the fingers. He has tried nothing for the symptoms. The treatment provided no relief.    History reviewed. No pertinent past medical history. Past Surgical History  Procedure Laterality Date  . Facial cosmetic surgery     History reviewed. No pertinent family history. History  Substance Use Topics  . Smoking status: Former Games developermoker  . Smokeless tobacco: Not on file  . Alcohol Use: No     Comment: rare    Review of Systems  Constitutional: Negative for fever, activity change and appetite change.       All ROS Neg except as noted in HPI  HENT: Positive for dental problem. Negative for nosebleeds.   Eyes: Negative for photophobia and discharge.  Respiratory: Negative for cough, shortness of breath and wheezing.   Cardiovascular: Negative for chest pain and palpitations.  Gastrointestinal: Negative for abdominal pain and blood in stool.  Genitourinary: Negative for dysuria, frequency and hematuria.  Musculoskeletal: Positive for arthralgias. Negative for back pain and neck pain.  Skin: Negative.   Neurological: Negative for dizziness, seizures, speech difficulty and numbness.  Psychiatric/Behavioral: Negative for hallucinations and confusion.      Allergies  Onion  Home Medications    Prior to Admission medications   Medication Sig Start Date End Date Taking? Authorizing Provider  HYDROcodone-acetaminophen (NORCO) 5-325 MG per tablet Take 2 tablets by mouth every 4 (four) hours as needed. 01/19/14   Geoffery Lyonsouglas Delo, MD  ibuprofen (ADVIL,MOTRIN) 800 MG tablet Take 1 tablet (800 mg total) by mouth 3 (three) times daily. 08/29/13   Tammi Triplett, PA-C  penicillin v potassium (VEETID) 500 MG tablet Take 1 tablet (500 mg total) by mouth 3 (three) times daily. 01/19/14   Geoffery Lyonsouglas Delo, MD  traMADol (ULTRAM) 50 MG tablet Take 1 tablet (50 mg total) by mouth every 6 (six) hours as needed. 04/14/14   Burgess AmorJulie Idol, PA-C   BP 157/94 mmHg  Pulse 73  Temp(Src) 97.8 F (36.6 C) (Oral)  Resp 18  Ht 5\' 9"  (1.753 m)  Wt 175 lb (79.379 kg)  BMI 25.83 kg/m2  SpO2 100% Physical Exam  Constitutional: He is oriented to person, place, and time. He appears well-developed and well-nourished.  Non-toxic appearance.  HENT:  Head: Normocephalic.  Right Ear: Tympanic membrane and external ear normal.  Left Ear: Tympanic membrane and external ear normal.  Eyes: EOM and lids are normal. Pupils are equal, round, and reactive to light.  Neck: Normal range of motion. Neck supple. Carotid bruit is not present.  Cardiovascular: Normal rate, regular rhythm, normal heart sounds, intact distal pulses and normal pulses.   Pulmonary/Chest: Breath sounds normal. No respiratory distress.  Abdominal: Soft. Bowel sounds are normal. There is no tenderness. There is no guarding.  Musculoskeletal: Normal range of motion.  FROM of the left shoulder and elbow. No forearm deformity. FROM of  the left wrist. DJD changes of the hands, left greater than right. Deformity of the left forth MP. Soreness with full extention and flexion. Cap refill wnl.  Lymphadenopathy:       Head (right side): No submandibular adenopathy present.       Head (left side): No submandibular adenopathy present.    He has no cervical adenopathy.   Neurological: He is alert and oriented to person, place, and time. He has normal strength. No cranial nerve deficit or sensory deficit.  Skin: Skin is warm and dry.  Psychiatric: He has a normal mood and affect. His speech is normal.  Nursing note and vitals reviewed.   ED Course  Procedures (including critical care time) Labs Review Labs Reviewed - No data to display  Imaging Review Dg Hand Complete Left  04/28/2014   CLINICAL DATA:  Hit hand on fork lift at work approximately 2 days ago, now with pain primarily involving the middle and ring fingers.  EXAM: LEFT HAND - COMPLETE 3+ VIEW  COMPARISON:  None.  FINDINGS: No acute fracture or dislocation. There is minimal avulsive change about the first MCP joint with preservation of joint spaces. Remaining joint spaces are preserved. No erosions. Regional soft tissues appear normal. No radiopaque foreign body.  IMPRESSION: 1. No acute findings. 2. Sequela of prior avulsive injury involving first MCP joint.   Electronically Signed   By: Simonne Come M.D.   On: 04/28/2014 08:08     EKG Interpretation None      MDM  Xray of the left hand is negative for fracture. There is sequela from previous injury. Vital signs non-acute. Plan - Rx for diclofenac and tramadol. Pt to follow up with Dr Hilda Lias if not improving.   Final diagnoses:  None    **I have reviewed nursing notes, vital signs, and all appropriate lab and imaging results for this patient.Ivery Quale, PA-C 04/28/14 [id number]  Bethann Berkshire, MD 04/28/14 [phone]

## 2014-04-28 NOTE — ED Notes (Signed)
Pt reports left hand pain since hitting it on forklift at work X2 days ago. Limited ROM. No deformity noted.

## 2014-05-07 ENCOUNTER — Encounter (HOSPITAL_COMMUNITY): Payer: Self-pay

## 2014-05-07 ENCOUNTER — Emergency Department (HOSPITAL_COMMUNITY)
Admission: EM | Admit: 2014-05-07 | Discharge: 2014-05-07 | Disposition: A | Discharge status: Home / Self Care | Payer: Self-pay | Attending: Emergency Medicine | Admitting: Emergency Medicine

## 2014-05-07 DIAGNOSIS — R590 Localized enlarged lymph nodes: Secondary | ICD-10-CM | POA: Insufficient documentation

## 2014-05-07 DIAGNOSIS — Z791 Long term (current) use of non-steroidal anti-inflammatories (NSAID): Secondary | ICD-10-CM | POA: Insufficient documentation

## 2014-05-07 DIAGNOSIS — Z720 Tobacco use: Secondary | ICD-10-CM | POA: Insufficient documentation

## 2014-05-07 DIAGNOSIS — Z792 Long term (current) use of antibiotics: Secondary | ICD-10-CM | POA: Insufficient documentation

## 2014-05-07 MED ORDER — CEPHALEXIN 500 MG PO CAPS: 500.0000 mg | ORAL_CAPSULE | Freq: Three times a day (TID) | ORAL | Status: DC

## 2014-05-07 MED ORDER — CEFTRIAXONE SODIUM 250 MG IJ SOLR
250.0000 mg | Freq: Once | INTRAMUSCULAR | Status: AC
  Administered 2014-05-07: 250 mg via INTRAMUSCULAR
  Filled 2014-05-07: qty 250

## 2014-05-07 MED ORDER — LIDOCAINE HCL (PF) 1 % IJ SOLN
INTRAMUSCULAR | Status: AC
  Administered 2014-05-07: 5 mL
  Filled 2014-05-07: qty 5

## 2014-05-07 MED ORDER — AZITHROMYCIN 250 MG PO TABS
1000.0000 mg | ORAL_TABLET | Freq: Once | ORAL | Status: AC
  Administered 2014-05-07: 1000 mg via ORAL
  Filled 2014-05-07: qty 4

## 2014-05-07 NOTE — ED Notes (Signed)
Patient states he has an abscess to right buttocks, "knots to pelvic area" and "something wrong with my scrotum" patient states its been going on for a few months.

## 2014-05-07 NOTE — Discharge Instructions (Signed)
Keflex as prescribed.  Return to the emergency department if your symptoms significantly worsen or change.   Swollen Lymph Nodes The lymphatic system filters fluid from around cells. It is like a system of blood vessels. These channels carry lymph instead of blood. The lymphatic system is an important part of the immune (disease fighting) system. When people talk about "swollen glands in the neck," they are usually talking about swollen lymph nodes. The lymph nodes are like the little traps for infection. You and your caregiver may be able to feel lymph nodes, especially swollen nodes, in these common areas: the groin (inguinal area), armpits (axilla), and above the clavicle (supraclavicular). You may also feel them in the neck (cervical) and the back of the head just above the hairline (occipital). Swollen glands occur when there is any condition in which the body responds with an allergic type of reaction. For instance, the glands in the neck can become swollen from insect bites or any type of minor infection on the head. These are very noticeable in children with only minor problems. Lymph nodes may also become swollen when there is a tumor or problem with the lymphatic system, such as Hodgkin's disease. TREATMENT   Most swollen glands do not require treatment. They can be observed (watched) for a short period of time, if your caregiver feels it is necessary. Most of the time, observation is not necessary.  Antibiotics (medicines that kill germs) may be prescribed by your caregiver. Your caregiver may prescribe these if he or she feels the swollen glands are due to a bacterial (germ) infection. Antibiotics are not used if the swollen glands are caused by a virus. HOME CARE INSTRUCTIONS   Take medications as directed by your caregiver. Only take over-the-counter or prescription medicines for pain, discomfort, or fever as directed by your caregiver. SEEK MEDICAL CARE IF:   If you begin to run a  temperature greater than 102 F (38.9 C), or as your caregiver suggests. MAKE SURE YOU:   Understand these instructions.  Will watch your condition.  Will get help right away if you are not doing well or get worse. Document Released: 01/14/2002 Document Revised: 04/18/2011 Document Reviewed: 01/24/2005 [facility] Patient Information 2015 [place], [place]. This information is not intended to replace advice given to you by your health care provider. Make sure you discuss any questions you have with your health care provider.

## 2014-05-07 NOTE — ED Provider Notes (Signed)
CSN: [id number]     Arrival date & time 05/07/14  0005 History   First MD Initiated Contact with Patient 05/07/14 0157     Chief Complaint  Patient presents with  . Abscess     (Consider location/radiation/quality/duration/timing/severity/associated sxs/prior Treatment) HPI Comments: Patient is a 39 year old male who presents with complaints of lumps in his right groin. He denies any new sexual contacts. He denies any urethral discharge or dysuria.  Patient is a 39 y.o. male presenting with abscess. The history is provided by the patient.  Abscess Abscess location: Right groin. Abscess quality: induration   Abscess quality: no fluctuance   Red streaking: no   Duration:  2 weeks Progression:  Worsening Chronicity:  New Relieved by:  Nothing Worsened by:  Nothing tried Ineffective treatments:  None tried   History reviewed. No pertinent past medical history. Past Surgical History  Procedure Laterality Date  . Facial cosmetic surgery     History reviewed. No pertinent family history. History  Substance Use Topics  . Smoking status: Current Every Day Smoker -- 1.00 packs/day    Types: Cigarettes  . Smokeless tobacco: Not on file  . Alcohol Use: Yes     Comment: rare    Review of Systems  All other systems reviewed and are negative.     Allergies  Onion  Home Medications   Prior to Admission medications   Medication Sig Start Date End Date Taking? Authorizing Provider  diclofenac (VOLTAREN) 75 MG EC tablet Take 1 tablet (75 mg total) by mouth 2 (two) times daily. 04/28/14   Ivery QualeHobson Bryant, PA-C  HYDROcodone-acetaminophen (NORCO) 5-325 MG per tablet Take 2 tablets by mouth every 4 (four) hours as needed. 01/19/14   Geoffery Lyonsouglas Kenlee Maler, MD  ibuprofen (ADVIL,MOTRIN) 800 MG tablet Take 1 tablet (800 mg total) by mouth 3 (three) times daily. 08/29/13   Tammi Triplett, PA-C  penicillin v potassium (VEETID) 500 MG tablet Take 1 tablet (500 mg total) by mouth 3 (three) times daily.  01/19/14   Geoffery Lyonsouglas Adi Doro, MD  traMADol Janean Sark(ULTRAM) 50 MG tablet 1 or 2 po q6h prn pain 04/28/14   Ivery QualeHobson Bryant, PA-C   BP 143/96 mmHg  Pulse 115  Temp(Src) 97.9 F (36.6 C) (Oral)  Resp 20  Ht 5\' 9"  (1.753 m)  Wt 170 lb (77.111 kg)  BMI 25.09 kg/m2  SpO2 100% Physical Exam  Constitutional: He is oriented to person, place, and time. He appears well-developed and well-nourished. No distress.  Neurological: He is alert and oriented to person, place, and time.  Skin: Skin is warm and dry. He is not diaphoretic.  There are several pea-sized what appear to be lymph nodes in the right inguinal region. The testicles otherwise appear normal. There is no urethral discharge. There are no skin lesions.  Nursing note and vitals reviewed.   ED Course  Procedures (including critical care time) Labs Review Labs Reviewed - No data to display  Imaging Review No results found.   EKG Interpretation None      MDM   Final diagnoses:  None    These appear to be lymph nodes. Patient declines to have a urethral swab performed. My concerns are of a possible STD and we'll treat with Rocephin and Zithromax. He will also be placed on Keflex for what appear to be reactive lymph nodes in his groin.    Geoffery Lyonsouglas Lorriann Hansmann, MD 05/07/14 [phone]

## 2014-07-12 ENCOUNTER — Emergency Department (HOSPITAL_COMMUNITY)
Admission: EM | Admit: 2014-07-12 | Discharge: 2014-07-13 | Disposition: A | Discharge status: Home / Self Care | Payer: Self-pay | Attending: Emergency Medicine | Admitting: Emergency Medicine

## 2014-07-12 ENCOUNTER — Encounter (HOSPITAL_COMMUNITY): Payer: Self-pay | Admitting: *Deleted

## 2014-07-12 DIAGNOSIS — F329 Major depressive disorder, single episode, unspecified: Secondary | ICD-10-CM | POA: Insufficient documentation

## 2014-07-12 DIAGNOSIS — I10 Essential (primary) hypertension: Secondary | ICD-10-CM | POA: Insufficient documentation

## 2014-07-12 DIAGNOSIS — F32A Depression, unspecified: Secondary | ICD-10-CM

## 2014-07-12 DIAGNOSIS — Z720 Tobacco use: Secondary | ICD-10-CM | POA: Insufficient documentation

## 2014-07-12 HISTORY — DX: Essential (primary) hypertension: I10

## 2014-07-12 NOTE — ED Provider Notes (Signed)
CSN: [id number]     Arrival date & time 07/12/14  2210 History   None   This chart was scribed for Azalia Bilis, MD by Marica Otter, ED Scribe. This patient was seen in room [facility]8/[facility]8 and the patient's care was started at 12:28 AM.  Chief Complaint  Patient presents with  . V70.1   HPI PCP: No PCP Per Patient HPI Comments: Paul Lambert is a 39 y.o. male, with PMH noted below, who presents to the Emergency Department because he needs someone to talk to. Pt presents with multiple challenges:  (1) Pt reports he does not have a regular place to live, however, he was staying with his girlfriend but they break up frequently. Pt notes he is unsure about where he will be staying today.  (2) Pt states his family lives in town but they do not care about him; pt also notes that his father is currently battling throat cancer.  (3) Pt reports he is currently unemployed.  Denials: Pt denies any daily meds. Pt denies SI, HI, auditory hallucinations.  Tearful during conversation  Past Medical History  Diagnosis Date  . Hypertension    Past Surgical History  Procedure Laterality Date  . Facial cosmetic surgery     No family history on file. History  Substance Use Topics  . Smoking status: Current Every Day Smoker -- 1.00 packs/day    Types: Cigarettes  . Smokeless tobacco: Not on file  . Alcohol Use: Yes     Comment: rare    Review of Systems A complete 10 system review of systems was obtained and all systems are negative except as noted in the HPI and PMH.  Allergies  Onion  Home Medications   Prior to Admission medications   Medication Sig Start Date End Date Taking? Authorizing Provider  cephALEXin (KEFLEX) 500 MG capsule Take 1 capsule (500 mg total) by mouth 3 (three) times daily. Patient not taking: Reported on 07/12/2014 05/07/14   Geoffery Lyons, MD  diclofenac (VOLTAREN) 75 MG EC tablet Take 1 tablet (75 mg total) by mouth 2 (two) times daily. Patient not taking: Reported on  07/12/2014 04/28/14   Ivery Quale, PA-C  HYDROcodone-acetaminophen (NORCO) 5-325 MG per tablet Take 2 tablets by mouth every 4 (four) hours as needed. Patient not taking: Reported on 07/12/2014 01/19/14   Geoffery Lyons, MD  ibuprofen (ADVIL,MOTRIN) 800 MG tablet Take 1 tablet (800 mg total) by mouth 3 (three) times daily. Patient not taking: Reported on 07/12/2014 08/29/13   Tammy Triplett, PA-C  penicillin v potassium (VEETID) 500 MG tablet Take 1 tablet (500 mg total) by mouth 3 (three) times daily. Patient not taking: Reported on 07/12/2014 01/19/14   Geoffery Lyons, MD  traMADol Janean Sark) 50 MG tablet 1 or 2 po q6h prn pain Patient not taking: Reported on 07/12/2014 04/28/14   Ivery Quale, PA-C   Triage Vitals: BP 148/88 mmHg  Pulse 147  Temp(Src) 99.2 F (37.3 C) (Oral)  Resp 22  Ht  (1.727 m)  Wt 180 lb (81.647 kg)  BMI 27.38 kg/m2  SpO2 98% Physical Exam  Constitutional: He is oriented to person, place, and time. He appears well-developed and well-nourished.  HENT:  Head: Normocephalic.  Eyes: EOM are normal.  Neck: Normal range of motion.  Pulmonary/Chest: Effort normal.  Abdominal: He exhibits no distension.  Musculoskeletal: Normal range of motion.  Neurological: He is alert and oriented to person, place, and time.  Psychiatric: His speech is normal and behavior is  normal. Judgment normal. He is not agitated, not aggressive and not withdrawn. Thought content is not paranoid and not delusional. Cognition and memory are not impaired. He exhibits a depressed mood. He expresses no suicidal plans and no homicidal plans.  Nursing note and vitals reviewed.   ED Course  Procedures (including critical care time) DIAGNOSTIC STUDIES: Oxygen Saturation is 98% on RA, nl by my interpretation.    COORDINATION OF CARE: 12:35 AM-Discussed treatment plan which includes mental healthcare referral with pt at bedside and pt agreed to plan. Pt advised to return to the ED immediately if Sx worsen  and pt has SI.   Labs Review Labs Reviewed - No data to display  Imaging Review No results found.   EKG Interpretation None      MDM   Final diagnoses:  None    Outpatient mental health resources given. Depression. Homeless. Outpatient resources given  I personally performed the services described in this documentation, which was scribed in my presence. The recorded information has been reviewed and is accurate.       Azalia BilisKevin Aneisa Karren, MD 07/13/14 [phone]

## 2014-07-12 NOTE — ED Notes (Signed)
Patients belongings removed and patient provided burgundy scrubs. Patients belongings secured in locker at this time.s

## 2014-07-12 NOTE — ED Notes (Signed)
Pt. Requesting to speak to someone. Pt. Denies SI/HI. Pt. Reports auditory hallucinations. Pt. Denies being on any medications. Pt. Appears euphoric. Pt. Laughing and talking constantly.

## 2014-07-12 NOTE — ED Notes (Signed)
Pt states that his "old lady " kicked him out and he does not have anywhere to go,

## 2014-07-12 NOTE — ED Notes (Signed)
Pt states that he just wants to talk to someone, RN asked pt multiple times about what was going on and what he needed to talk to the dr. About, pt states that he needs to talk to the dr about life, denies any SI/HI, denies any auditory hallucinations, admits to seeing "black things that are not there and trying to catch them".

## 2014-07-13 NOTE — Discharge Instructions (Signed)
°Emergency Department Resource Guide °1) Find a Doctor and Pay Out of Pocket °Although you won't have to find out who is covered by your insurance plan, it is a good idea to ask around and get recommendations. You will then need to call the office and see if the doctor you have chosen will accept you as a new patient and what types of options they offer for patients who are self-pay. Some doctors offer discounts or will set up payment plans for their patients who do not have insurance, but you will need to ask so you aren't surprised when you get to your appointment. ° °2) Contact Your Local Health Department °Not all health departments have doctors that can see patients for sick visits, but many do, so it is worth a call to see if yours does. If you don't know where your local health department is, you can check in your phone book. The CDC also has a tool to help you locate your state's health department, and many state websites also have listings of all of their local health departments. ° °3) Find a Walk-in Clinic °If your illness is not likely to be very severe or complicated, you may want to try a walk in clinic. These are popping up all over the country in pharmacies, drugstores, and shopping centers. They're usually staffed by nurse practitioners or physician assistants that have been trained to treat common illnesses and complaints. They're usually fairly quick and inexpensive. However, if you have serious medical issues or chronic medical problems, these are probably not your best option. ° °No Primary Care Doctor: °- Call [facility] at  832-8000 - they can help you locate a primary care doctor that  accepts your insurance, provides certain services, etc. °- Physician Referral Service- 1-800-533-3463 ° °Chronic Pain Problems: °Organization         Address  Phone   Notes  °[facility]  (336) 297-2271 Patients need to be referred by their primary care doctor.  ° °Medication  Assistance: °Organization         Address  Phone   Notes  °[place] [facility] [address] °[place], [place] [address] (336) 641-8030 --Must be a resident of [place] °-- Must have NO insurance coverage whatsoever (no Medicaid/ Medicare, etc.) °-- The pt. MUST have a primary care doctor that directs their care regularly and follows them in the community °  °[facility]  (866) 331-1348   °[employer]  (888) 892-1162   ° °Agencies that provide inexpensive medical care: °Organization         Address  Phone   Notes  °[facility]  (336) 832-8035   °[facility]    (336) 832-7272   °[facility] [address] °[place], [place] [address] (336) 832-4777   °[facility] [address], °[place] (336) 271-4999   °[facility]    (336) 373-0678   °[facility]    (336) 272-1050   °[facility] ° [address], [place] Phone:  (336) 832-4444, Fax:  (336) 832-4440 Hours of Operation:  9 am - 6 pm, M-F.  Also accepts Medicaid/Medicare and self-pay.  °[facility] ° [address], [place] Phone: (336) [phone], Fax: (336) [phone]. Hours of Operation:  8:30 am - 5:30 pm, M-F.  Also accepts Medicaid and self-pay.  °[facility] [place] [address], [place] Phone: (336) 878-6027   °[facility] [address], [place], [place] (336)723-1848, Ext. 123 Mondays & Thursdays: 7-9 AM.  First 15 patients are seen on a first come, first serve basis. °  ° °Medicaid-accepting [place] Providers: ° °Organization         Address  Phone   Notes  °[facility] [address], [place] (336) 641-2100 Also accepts self-pay patients.  °[facility] [address], [place] ° (336) 856-9996   °[facility] [address], [place]  (336) 288-8857   °[facility] [address] [place] [address], [place] (336) 299-7000   °Veita Bland [address], [place]  ° (336) 373-1557 Only accepts [place] Access Medicaid patients after they have their name applied to their card.  ° °Self-Pay (no insurance) in [place]: ° °Organization         Address  Phone   Notes  °Sickle Cell Patients, [facility] [address], [place] (336) 832-1970   °[facility] [address], [place] (336) 832-4400   °[facility] ° [address], [place] (336) 992-4800   °[facility]/Dr. Osei-Bonsu ° [address] [place] [address], [place] or [address], [place] (336) [phone] Phone number for both [place] and [place] locations is the same.  °[facility] [address], [place] (336) 299-0000   °[facility] [address] [place] [address], [place] or [address] (336) 852-7530 °(336) 878-2260   °[facility] [address], [place] (336) 350-1642, phone; (336) 294-5005, fax Sees patients 1st and 3rd Saturday of every month.  Must not qualify for public or private insurance (i.e. Medicaid, Medicare, [place] Health Choice, Veterans' Benefits) • Household income should be no more than 200% of the poverty level •The clinic cannot treat you if you are pregnant or think you are pregnant • Sexually transmitted diseases are not treated at the clinic.  ° ° °Dental Care: °Organization         Address  Phone  Notes  °[place] [facility] [address], [place] (336) 641-6152 Accepts children up to age 21 who are enrolled in Medicaid or [place] Health Choice; pregnant women with a Medicaid card; and children who have applied for Medicaid or [place] Health Choice, but were declined, whose parents can pay a reduced fee at time of service.  °[place]  [facility] [place]  [address], [place] (336) 641-7733 Accepts children up to age 21 who are enrolled in Medicaid or [place] Health Choice; pregnant women with a Medicaid card; and children who have applied for Medicaid or  Health Choice, but were declined, whose parents can pay a reduced fee at time of service.  °[facility] ° [address], [place] (336) 641-4533 Patients are seen by appointment only. Walk-ins are not accepted. [facility] will see patients 18 years of age and older. °Monday - Tuesday (8am-5pm) °Most Wednesdays (8:30-5pm) °$30 per visit, cash only  °[facility] ° [address], [place] (336) 641-4533 Patients are seen by appointment only. Walk-ins are not accepted. [facility] will see patients 18 years of age and older. °One   Wednesday Evening (Monthly: Volunteer Based).  $30 per visit, cash only  °[facility]  (919) 537-3737 for adults; Children under age 4, call Graduate Pediatric Dentistry at ([phone]) [phone]. Children aged 4-14, please call (919) [phone] to request a pediatric application. ° Dental services are provided in all areas of dental care including fillings, crowns and bridges, complete and partial dentures, implants, gum treatment, root canals, and extractions. Preventive care is also provided. Treatment is provided to both adults and children. °Patients are selected via a lottery and there is often a waiting list. °  °[facility] [address], °[place] ° (336) 763-8833 www.drcivils.com °  °[facility] [address], [place], [place] (336)723-1848, Ext. 123 Second and Fourth Thursday of each month, opens at 6:30 AM; Clinic ends at 9 AM.  Patients are seen on a first-come first-served basis, and a limited number are seen during each clinic.  ° °[facility] ° [address], [place], [place] (336) 723-7904    Eligibility Requirements °You must have lived in [place], [place], or [place] counties for at least the last three months. °  You cannot be eligible for state or federal sponsored healthcare insurance, including Veterans Administration, Medicaid, or Medicare. °  You generally cannot be eligible for healthcare insurance through your employer.  °  How to apply: °Eligibility screenings are held every Tuesday and Wednesday afternoon from 1:00 pm until 4:00 pm. You do not need an appointment for the interview!  °[facility] [address], [place], [place] 336-631-2330   °[facility]  336-342-8273   °[facility]  336-703-3100   °[facility]  336-570-6415   ° °Behavioral Health Resources in the Community: °Intensive Outpatient Programs °Organization         Address  Phone  Notes  °[facility] [address], [place], [place] 336-878-6098   °[facility] [address], [place], [place] 336-832-9800   °[facility] [address], [place], [place] ° 336-882-2125   °[facility] [address],  °[place], [place] 1-800-853-5163 or 336-641-4981   °Substance Abuse Resources °Organization         Address  Phone  Notes  °[facility]  336-882-2125   °[facility]  336-784-9470   °[facility]  336-285-9073   °[facility]  336-845-3988   °Residential & Outpatient Substance Abuse Program  1-800-659-3381   °Psychological Services °Organization         Address  Phone  Notes  °[facility]  336- 832-9600   °[facility]  336- 378-7881   °[facility] [address], [place] 1-800-853-5163 or 336-641-4981   ° °Mobile Crisis Teams °Organization         Address  Phone  Notes  °[facility], Mobile Crisis Care Unit  1-877-626-1772   °Assertive °[facility] ° [address]  [place], [place] 336-834-9664   °Sharon DeEsch [address] °[place] 336-554-5454   ° °Self-Help/Support Groups °Organization         Address  Phone             Notes  °[facility] - variety of support groups  336- 373-1402 Call for more information  °Narcotics Anonymous (NA), [facility] [address], °[place]   2 meetings at this location  ° °  Residential Treatment Programs °Organization         Address  Phone  Notes  °[facility] [address],    °[place]  1-866-801-8205   °[facility] ° [address], [place], [place] 704-293-8524   °[facility] [address], [place] 336-845-3988 Admissions: 8am-3pm M-F  °[facility] [address]    °[place], [place] 336-841-1104   °[facility] [address], [place], [place] 336-379-7146   °[facility] [address]  °[place], [place] 336-285-9073   °[facility] - Intensive Outpatient [address], [place], [place] 336-852-3033   °[facility] [address]  °[place], [place] 1-877-615-2722 or 336-784-9470   °[facility] [address] [place], [place] 336-227-7417 Accepts Medicaid  °[facility] [address]  °[place] 1-800-659-3381 Substance Abuse/Addiction Treatment  ° °[place] Behavioral Health Resources °Organization         Address  Phone  Notes  °[facility]  (888) 581-9988   °Julie Brannon, PhD [address] [place], [place]   (336) 349-5553 or (336) 951-0000   °[facility]   [address] °[place], [place] (336) 349-4454   °[facility] [address], [place], [place] (336) 342-8316 Insurance/Medicaid/sponsorship through [facility]  °[facility] [address]                                    [place], [place] (336) 342-8316 Therapy/tele-psych/case    °[facility] [address]  ° [place], [place] (336) 349-2233    °Dr. Arfeen  (336) 349-4544   °[facility] [place]  [employer] [place] [facility] 1) [address], [place] °2) [address], [place] °3)  [address], [place] (336) 349-3220 °(336) 342-7768 ° °(336) 342-8140   °[place] [facility] (336) 342-1394 or (336) 342-3537 (After Hours)    ° ° ° °Depression °Depression refers to feeling sad, low, down in the dumps, blue, gloomy, or empty. In general, there are two kinds of depression: °1. Normal sadness or normal grief. This kind of depression is one that we all feel from time to time after upsetting life experiences, such as the loss of a job or the ending of a relationship. This kind of depression is considered normal, is short lived, and resolves within a few days to 2 weeks. Depression experienced after the loss of a loved one (bereavement) often lasts longer than 2 weeks but normally gets better with time. °2. Clinical depression. This kind of depression lasts longer than normal sadness or normal grief or interferes with your ability to function at home, at work, and in school. It also interferes with your personal relationships. It affects almost every aspect of your life. Clinical depression is an illness. °Symptoms of depression can also be caused by conditions other than those mentioned above, such as: °· Physical illness. Some physical illnesses, including underactive thyroid gland (hypothyroidism), severe anemia, specific types of cancer, diabetes, uncontrolled seizures, heart and lung problems, strokes, and chronic pain are commonly associated with symptoms of depression. °· Side effects of some prescription medicine. In some people, certain types of medicine can cause symptoms of depression. °· Substance abuse. Abuse of alcohol and illicit drugs can cause   symptoms of depression. °SYMPTOMS °Symptoms of normal sadness and normal grief include  the following: °· Feeling sad or crying for short periods of time. °· Not caring about anything (apathy). °· Difficulty sleeping or sleeping too much. °· No longer able to enjoy the things you used to enjoy. °· Desire to be by oneself all the time (social isolation). °· Lack of energy or motivation. °· Difficulty concentrating or remembering. °· Change in appetite or weight. °· Restlessness or agitation. °Symptoms of clinical depression include the same symptoms of normal sadness or normal grief and also the following symptoms: °· Feeling sad or crying all the time. °· Feelings of guilt or worthlessness. °· Feelings of hopelessness or helplessness. °· Thoughts of suicide or the desire to harm yourself (suicidal ideation). °· Loss of touch with reality (psychotic symptoms). Seeing or hearing things that are not real (hallucinations) or having false beliefs about your life or the people around you (delusions and paranoia). °DIAGNOSIS  °The diagnosis of clinical depression is usually based on how bad the symptoms are and how long they have lasted. Your health care provider will also ask you questions about your medical history and substance use to find out if physical illness, use of prescription medicine, or substance abuse is causing your depression. Your health care provider may also order blood tests. °TREATMENT  °Often, normal sadness and normal grief do not require treatment. However, sometimes antidepressant medicine is given for bereavement to ease the depressive symptoms until they resolve. °The treatment for clinical depression depends on how bad the symptoms are but often includes antidepressant medicine, counseling with a mental health professional, or both. Your health care provider will help to determine what treatment is best for you. °Depression caused by physical illness usually goes away with appropriate medical treatment of the illness. If prescription medicine is causing depression, talk with your  health care provider about stopping the medicine, decreasing the dose, or changing to another medicine. °Depression caused by the abuse of alcohol or illicit drugs goes away when you stop using these substances. Some adults need professional help in order to stop drinking or using drugs. °SEEK IMMEDIATE MEDICAL CARE IF: °· You have thoughts about hurting yourself or others. °· You lose touch with reality (have psychotic symptoms). °· You are taking medicine for depression and have a serious side effect. °FOR MORE INFORMATION °· National Alliance on Mental Illness: www.nami.org  °· National Institute of Mental Health: www.nimh.nih.gov  °Document Released: 01/22/2000 Document Revised: 06/10/2013 Document Reviewed: 04/25/2011 °ExitCare® Patient Information ©2015 [employer]. This information is not intended to replace advice given to you by your health care provider. Make sure you discuss any questions you have with your health care provider. ° °

## 2014-12-23 ENCOUNTER — Emergency Department (HOSPITAL_COMMUNITY)
Admission: EM | Admit: 2014-12-23 | Discharge: 2014-12-23 | Disposition: A | Discharge status: Home / Self Care | Payer: Self-pay | Attending: Emergency Medicine | Admitting: Emergency Medicine

## 2014-12-23 ENCOUNTER — Emergency Department (HOSPITAL_COMMUNITY): Payer: Self-pay

## 2014-12-23 ENCOUNTER — Encounter (HOSPITAL_COMMUNITY): Payer: Self-pay | Admitting: Emergency Medicine

## 2014-12-23 DIAGNOSIS — F17210 Nicotine dependence, cigarettes, uncomplicated: Secondary | ICD-10-CM | POA: Insufficient documentation

## 2014-12-23 DIAGNOSIS — M7552 Bursitis of left shoulder: Secondary | ICD-10-CM | POA: Insufficient documentation

## 2014-12-23 DIAGNOSIS — I10 Essential (primary) hypertension: Secondary | ICD-10-CM | POA: Insufficient documentation

## 2014-12-23 MED ORDER — CYCLOBENZAPRINE HCL 10 MG PO TABS
10.0000 mg | ORAL_TABLET | Freq: Once | ORAL | Status: AC
  Administered 2014-12-23: 10 mg via ORAL
  Filled 2014-12-23: qty 1

## 2014-12-23 MED ORDER — NAPROXEN 250 MG PO TABS: 500.0000 mg | ORAL_TABLET | Freq: Once | ORAL | Status: DC

## 2014-12-23 MED ORDER — NAPROXEN 500 MG PO TABS: 500.0000 mg | ORAL_TABLET | Freq: Two times a day (BID) | ORAL | Status: DC

## 2014-12-23 MED ORDER — HYDROCODONE-ACETAMINOPHEN 5-325 MG PO TABS: ORAL_TABLET | ORAL | Status: DC

## 2014-12-23 MED ORDER — HYDROCODONE-ACETAMINOPHEN 5-325 MG PO TABS
1.0000 | ORAL_TABLET | Freq: Once | ORAL | Status: AC
  Administered 2014-12-23: 1 via ORAL
  Filled 2014-12-23: qty 1

## 2014-12-23 NOTE — Discharge Instructions (Signed)
Bursitis  Bursitis is when the fluid-filled sac (bursa) that covers and protects a joint is swollen (inflamed). Bursitis is most common near joints, especially the knees, elbows, hips, and shoulders.   HOME CARE  · Take medicines only as told by your doctor.  · If you were prescribed an antibiotic medicine, finish it all even if you start to feel better.  · Rest the affected area as told by your doctor.  ¨ Keep the area raised up.  ¨ Avoid doing things that make the pain worse.  · Apply ice to the injured area:  ¨ Place ice in a plastic bag.  ¨ Place a towel between your skin and the bag.  ¨ Leave the ice on for 20 minutes, 2-3 times a day.  · Use splints, braces, pads, or walking aids as told by your doctor.  · Keep all follow-up visits as told by your doctor. This is important.  GET HELP IF:   · You have more pain with home care.  · You have a fever.  · You have chills.     This information is not intended to replace advice given to you by your health care provider. Make sure you discuss any questions you have with your health care provider.     Document Released: 07/14/2009 Document Revised: 02/14/2014 Document Reviewed: 04/15/2013  Elsevier Interactive Patient Education ©2016 Elsevier Inc.

## 2014-12-23 NOTE — ED Notes (Signed)
Pt states that he has been having left shoulder pain for the past 4 days after doing repetitive heavy lifting at his job.  Denies traumatic injury.

## 2014-12-25 NOTE — ED Provider Notes (Signed)
CSN: [id number]     Arrival date & time 12/23/14  1815 History   First MD Initiated Contact with Patient 12/23/14 1848     Chief Complaint  Patient presents with  . Shoulder Pain     (Consider location/radiation/quality/duration/timing/severity/associated sxs/prior Treatment) HPI   Paul Lambert is a 39 y.o. male who presents to the Emergency Department complaining of left shoulder pain for 4 days.  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  He describes the pain as sharp and aching and worse with raising the arm.  He has not tried any therapies.  He denies trauma, neck pain, swelling, redness, numbness or weakness of the extremity.   Past Medical History  Diagnosis Date  . Hypertension    Past Surgical History  Procedure Laterality Date  . Facial cosmetic surgery     History reviewed. No pertinent family history. Social History  Substance Use Topics  . Smoking status: Current Every Day Smoker -- 1.00 packs/day    Types: Cigarettes  . Smokeless tobacco: None  . Alcohol Use: Yes     Comment: rare    Review of Systems  Constitutional: Negative for fever and chills.  Respiratory: Negative for cough and shortness of breath.   Cardiovascular: Negative for chest pain.  Musculoskeletal: Positive for arthralgias (left shoulder). Negative for joint swelling and neck pain.  Skin: Negative for color change and wound.  Neurological: Negative for dizziness, weakness, numbness and headaches.  All other systems reviewed and are negative.     Allergies  Onion  Home Medications   Prior to Admission medications   Medication Sig Start Date End Date Taking? Authorizing Provider  cephALEXin (KEFLEX) 500 MG capsule Take 1 capsule (500 mg total) by mouth 3 (three) times daily. Patient not taking: Reported on 07/12/2014 05/07/14   Geoffery Lyonsouglas Delo, MD  diclofenac (VOLTAREN) 75 MG EC tablet Take 1 tablet (75 mg total) by mouth  2 (two) times daily. Patient not taking: Reported on 07/12/2014 04/28/14   Ivery QualeHobson Bryant, PA-C  HYDROcodone-acetaminophen (NORCO/VICODIN) 5-325 MG tablet Take one-two tabs po q 4-6 hrs prn pain 12/23/14   Annissa Andreoni, PA-C  ibuprofen (ADVIL,MOTRIN) 800 MG tablet Take 1 tablet (800 mg total) by mouth 3 (three) times daily. Patient not taking: Reported on 07/12/2014 08/29/13   Odies Desa, PA-C  naproxen (NAPROSYN) 500 MG tablet Take 1 tablet (500 mg total) by mouth 2 (two) times daily with a meal. 12/23/14   Laramie Gelles, PA-C  penicillin v potassium (VEETID) 500 MG tablet Take 1 tablet (500 mg total) by mouth 3 (three) times daily. Patient not taking: Reported on 07/12/2014 01/19/14   Geoffery Lyonsouglas Delo, MD  traMADol (ULTRAM) 50 MG tablet 1 or 2 po q6h prn pain Patient not taking: Reported on 07/12/2014 04/28/14   Ivery QualeHobson Bryant, PA-C   BP 140/64 mmHg  Pulse 101  Temp(Src) 98.1 F (36.7 C) (Oral)  Resp 18  Ht 5\' 9"  (1.753 m)  Wt 160 lb (72.576 kg)  BMI 23.62 kg/m2  SpO2 100% Physical Exam  Constitutional: He appears well-developed and well-nourished. No distress.  HENT:  Head: Atraumatic.  Neck: Normal range of motion and full passive range of motion without pain. Neck supple. No spinous process tenderness and no muscular tenderness present.  Cardiovascular: Normal rate and regular rhythm.   Pulmonary/Chest: Effort normal. No respiratory distress. He exhibits no tenderness.  Musculoskeletal: Normal range of motion. He exhibits no  edema.  Diffuse ttp of anterior and lateral left shoulder.  No edema, step offs, erythema or crepitus.  Pain reproduced with abduction.  Grip strength strong and symmetrical,  Distal sensation intact  Neurological: He is alert. Coordination normal.  Skin: Skin is warm. No rash noted.  Nursing note and vitals reviewed.   ED Course  Procedures (including critical care time)   Imaging Review Dg Shoulder Left  12/23/2014  CLINICAL DATA:  Popping injury while  lifting 4 days ago. Pain in left shoulder since that time. Patient states left shoulder fracture 3 years ago. EXAM: LEFT SHOULDER - 2+ VIEW COMPARISON:  Multiple exams, including 08/28/2013 FINDINGS: There is no evidence of fracture or dislocation. There is no evidence of arthropathy or other focal bone abnormality. Soft tissues are unremarkable. IMPRESSION: Negative. Electronically Signed   By: Gaylyn Rong M.D.   On: 12/23/2014 19:07   I have personally reviewed and evaluated these images and lab results as part of my medical decision-making.    MDM   Final diagnoses:  Bursitis, shoulder, left   Pt is well appearing.  NV and NS intact.  Sx's likely related to bursitis.  No concerning sx's for septic joint.  Pt agrees to symptomatic tx and     Pauline Aus, PA-C 12/25/14 1347  Vanetta Mulders, MD 12/28/14 [phone]

## 2017-02-21 ENCOUNTER — Emergency Department (HOSPITAL_COMMUNITY)
Admission: EM | Admit: 2017-02-21 | Discharge: 2017-02-21 | Disposition: A | Discharge status: Home / Self Care | Payer: Self-pay | Attending: Emergency Medicine | Admitting: Emergency Medicine

## 2017-02-21 ENCOUNTER — Emergency Department (HOSPITAL_COMMUNITY): Payer: Self-pay

## 2017-02-21 ENCOUNTER — Encounter (HOSPITAL_COMMUNITY): Payer: Self-pay | Admitting: *Deleted

## 2017-02-21 ENCOUNTER — Emergency Department (HOSPITAL_COMMUNITY)
Admission: EM | Admit: 2017-02-21 | Discharge: 2017-02-21 | Discharge status: Against Medical Advice | Payer: Self-pay | Attending: Emergency Medicine | Admitting: Emergency Medicine

## 2017-02-21 ENCOUNTER — Encounter (HOSPITAL_COMMUNITY): Payer: Self-pay | Admitting: Emergency Medicine

## 2017-02-21 ENCOUNTER — Other Ambulatory Visit: Payer: Self-pay

## 2017-02-21 ENCOUNTER — Emergency Department (HOSPITAL_COMMUNITY)
Admission: EM | Admit: 2017-02-21 | Discharge: 2017-02-22 | Disposition: A | Discharge status: Home / Self Care | Payer: Self-pay | Attending: Emergency Medicine | Admitting: Emergency Medicine

## 2017-02-21 DIAGNOSIS — Y908 Blood alcohol level of 240 mg/100 ml or more: Secondary | ICD-10-CM | POA: Insufficient documentation

## 2017-02-21 DIAGNOSIS — F17210 Nicotine dependence, cigarettes, uncomplicated: Secondary | ICD-10-CM | POA: Insufficient documentation

## 2017-02-21 DIAGNOSIS — R569 Unspecified convulsions: Secondary | ICD-10-CM | POA: Insufficient documentation

## 2017-02-21 DIAGNOSIS — Z79899 Other long term (current) drug therapy: Secondary | ICD-10-CM | POA: Insufficient documentation

## 2017-02-21 DIAGNOSIS — F10921 Alcohol use, unspecified with intoxication delirium: Secondary | ICD-10-CM

## 2017-02-21 DIAGNOSIS — F10121 Alcohol abuse with intoxication delirium: Secondary | ICD-10-CM | POA: Insufficient documentation

## 2017-02-21 DIAGNOSIS — I10 Essential (primary) hypertension: Secondary | ICD-10-CM | POA: Insufficient documentation

## 2017-02-21 DIAGNOSIS — F445 Conversion disorder with seizures or convulsions: Secondary | ICD-10-CM | POA: Insufficient documentation

## 2017-02-21 DIAGNOSIS — R404 Transient alteration of awareness: Secondary | ICD-10-CM | POA: Insufficient documentation

## 2017-02-21 DIAGNOSIS — F10920 Alcohol use, unspecified with intoxication, uncomplicated: Secondary | ICD-10-CM | POA: Insufficient documentation

## 2017-02-21 HISTORY — DX: Unspecified convulsions: R56.9

## 2017-02-21 LAB — URINALYSIS, ROUTINE W REFLEX MICROSCOPIC
Bacteria, UA: NONE SEEN
Bilirubin Urine: NEGATIVE
GLUCOSE, UA: NEGATIVE mg/dL
Ketones, ur: NEGATIVE mg/dL
Leukocytes, UA: NEGATIVE
NITRITE: NEGATIVE
Protein, ur: NEGATIVE mg/dL
RBC / HPF: NONE SEEN RBC/hpf (ref 0–5)
SPECIFIC GRAVITY, URINE: 1.002 — AB (ref 1.005–1.030)
Squamous Epithelial / LPF: NONE SEEN
pH: 7 (ref 5.0–8.0)

## 2017-02-21 LAB — ETHANOL
ALCOHOL ETHYL (B): 264 mg/dL — AB (ref <10)
Alcohol, Ethyl (B): 309 mg/dL (ref <10)

## 2017-02-21 LAB — BASIC METABOLIC PANEL
Anion gap: 13 (ref 5–15)
BUN: 6 mg/dL (ref 6–20)
CALCIUM: 8.9 mg/dL (ref 8.9–10.3)
CO2: 28 mmol/L (ref 22–32)
CREATININE: 1.03 mg/dL (ref 0.61–1.24)
Chloride: 103 mmol/L (ref 101–111)
GFR calc Af Amer: >60 mL/min (ref >60)
GLUCOSE: 97 mg/dL (ref 65–99)
POTASSIUM: 4.4 mmol/L (ref 3.5–5.1)
SODIUM: 144 mmol/L (ref 135–145)

## 2017-02-21 LAB — CBC WITH DIFFERENTIAL/PLATELET
BASOS PCT: 1 %
Basophils Absolute: 0.1 10*3/uL (ref 0.0–0.1)
Basophils Absolute: 0.1 10*3/uL (ref 0.0–0.1)
Basophils Relative: 1 %
EOS ABS: 0.1 10*3/uL (ref 0.0–0.7)
EOS ABS: 0.2 10*3/uL (ref 0.0–0.7)
EOS PCT: 2 %
Eosinophils Relative: 3 %
HCT: 46.1 % (ref 39.0–52.0)
HCT: 47.7 % (ref 39.0–52.0)
Hemoglobin: 15.5 g/dL (ref 13.0–17.0)
Hemoglobin: 15.9 g/dL (ref 13.0–17.0)
LYMPHS ABS: 2.5 10*3/uL (ref 0.7–4.0)
LYMPHS ABS: 4.4 10*3/uL — AB (ref 0.7–4.0)
LYMPHS PCT: 58 %
Lymphocytes Relative: 62 %
MCH: 31 pg (ref 26.0–34.0)
MCH: 30.6 pg (ref 26.0–34.0)
MCHC: 33.3 g/dL (ref 30.0–36.0)
MCHC: 33.6 g/dL (ref 30.0–36.0)
MCV: 92.2 fL (ref 78.0–100.0)
MCV: 91.9 fL (ref 78.0–100.0)
MONO ABS: 0.2 10*3/uL (ref 0.1–1.0)
MONO ABS: 0.5 10*3/uL (ref 0.1–1.0)
MONOS PCT: 4 %
MONOS PCT: 7 %
Neutro Abs: 2 10*3/uL (ref 1.7–7.7)
Neutro Abs: 1.5 10*3/uL — ABNORMAL LOW (ref 1.7–7.7)
Neutrophils Relative %: 27 %
Neutrophils Relative %: 35 %
PLATELETS: 293 10*3/uL (ref 150–400)
Platelets: 273 10*3/uL (ref 150–400)
RBC: 5 MIL/uL (ref 4.22–5.81)
RBC: 5.19 MIL/uL (ref 4.22–5.81)
RDW: 13.4 % (ref 11.5–15.5)
RDW: 13.4 % (ref 11.5–15.5)
WBC: 7.2 10*3/uL (ref 4.0–10.5)
WBC: 4.3 10*3/uL (ref 4.0–10.5)

## 2017-02-21 LAB — RAPID URINE DRUG SCREEN, HOSP PERFORMED
Amphetamines: NOT DETECTED
Amphetamines: NOT DETECTED
BARBITURATES: NOT DETECTED
BENZODIAZEPINES: NOT DETECTED
Barbiturates: NOT DETECTED
Benzodiazepines: POSITIVE — AB
COCAINE: NOT DETECTED
Cocaine: NOT DETECTED
OPIATES: NOT DETECTED
Opiates: NOT DETECTED
TETRAHYDROCANNABINOL: NOT DETECTED
Tetrahydrocannabinol: NOT DETECTED

## 2017-02-21 LAB — TROPONIN I: Troponin I: <0.03 ng/mL (ref <0.03)

## 2017-02-21 LAB — COMPREHENSIVE METABOLIC PANEL
ALBUMIN: 4.8 g/dL (ref 3.5–5.0)
ALK PHOS: 53 U/L (ref 38–126)
ALT: 34 U/L (ref 17–63)
AST: 37 U/L (ref 15–41)
Anion gap: 14 (ref 5–15)
BILIRUBIN TOTAL: 1.2 mg/dL (ref 0.3–1.2)
BUN: 7 mg/dL (ref 6–20)
CO2: 26 mmol/L (ref 22–32)
CREATININE: 1.03 mg/dL (ref 0.61–1.24)
Calcium: 8.8 mg/dL — ABNORMAL LOW (ref 8.9–10.3)
Chloride: 99 mmol/L — ABNORMAL LOW (ref 101–111)
GFR calc Af Amer: >60 mL/min (ref >60)
GFR calc non Af Amer: >60 mL/min (ref >60)
GLUCOSE: 107 mg/dL — AB (ref 65–99)
Potassium: 3.6 mmol/L (ref 3.5–5.1)
Sodium: 139 mmol/L (ref 135–145)
TOTAL PROTEIN: 8 g/dL (ref 6.5–8.1)

## 2017-02-21 LAB — CBG MONITORING, ED: GLUCOSE-CAPILLARY: 104 mg/dL — AB (ref 65–99)

## 2017-02-21 LAB — MAGNESIUM: Magnesium: 2.1 mg/dL (ref 1.7–2.4)

## 2017-02-21 MED ORDER — SODIUM CHLORIDE 0.9 % IV BOLUS (SEPSIS)
1000.0000 mL | Freq: Once | INTRAVENOUS | Status: AC
Start: 2017-02-21 — End: 2017-02-21
  Administered 2017-02-21: 1000 mL via INTRAVENOUS

## 2017-02-21 MED ORDER — IBUPROFEN 800 MG PO TABS
800.0000 mg | ORAL_TABLET | Freq: Once | ORAL | Status: AC
  Administered 2017-02-21: 800 mg via ORAL
  Filled 2017-02-21: qty 1

## 2017-02-21 MED ORDER — ACETAMINOPHEN 500 MG PO TABS
1000.0000 mg | ORAL_TABLET | Freq: Once | ORAL | Status: AC
  Administered 2017-02-21: 1000 mg via ORAL
  Filled 2017-02-21: qty 2

## 2017-02-21 NOTE — ED Triage Notes (Signed)
Pt had seizure like activity witnessed by his girlfriend, states pt just stared off into space, shook his hand and one arm.  Pt states he has no hx of seizures until today.  A&O at this time, c/o headache. Hx of hypertension, pt reports he does not take medication because he is unable to afford medication.

## 2017-02-21 NOTE — ED Notes (Signed)
Dr.Knapp at bedside  

## 2017-02-21 NOTE — ED Notes (Signed)
Pt states he cannot wait any longer.  Explained to pt that edp is waiting on neurologist to call.  Pt states his ride is here and he has to leave.  Explained to pt risk of leaving before d/c, pt states he understands but has to go

## 2017-02-21 NOTE — ED Provider Notes (Signed)
[facility] Provider Note   CSN: [id number] Arrival date & time: 02/21/17  1213     History   Chief Complaint No chief complaint on file.   HPI Paul GhaziClyde L Lambert is a 42 y.o. male.  Patient returns to the emergency department for staring episodes.  Most of history obtained from the significant other.  Patient reports 3-4 episodes each lasting 5-10 minutes of staring into space today.  He then spontaneously recovers and returns to normal.  There is no postictal behavior.  He had a similar episode last night resulting in an emergency department visit.  Past medical history includes hypertension, but he takes no medications.  He smokes cigarettes.  Denies street drugs.  No prodromal illnesses.      Past Medical History:  Diagnosis Date  . Hypertension     There are no active problems to display for this patient.   Past Surgical History:  Procedure Laterality Date  . FACIAL COSMETIC SURGERY         Home Medications    Prior to Admission medications   Not on File    Family History No family history on file.  Social History Social History   Tobacco Use  . Smoking status: Current Every Day Smoker    Packs/day: 0.50    Types: Cigarettes  . Smokeless tobacco: Never Used  Substance Use Topics  . Alcohol use: Yes    Alcohol/week: 0.6 - 1.2 oz    Types: 1 - 2 Cans of beer per week    Comment: occasionally   . Drug use: No     Allergies   Onion   Review of Systems Review of Systems  All other systems reviewed and are negative.    Physical Exam Updated Vital Signs BP 126/80   Pulse 81   Temp 98.1 F (36.7 C) (Oral)   Resp 18   Ht 5\' 9"  (1.753 m)   Wt 81.6 kg (180 lb)   SpO2 99%   BMI 26.58 kg/m   Physical Exam  Constitutional: He is oriented to person, place, and time. He appears well-developed and well-nourished.  HENT:  Head: Normocephalic and atraumatic.  Eyes: Conjunctivae are normal.  Neck: Neck supple.    Cardiovascular: Normal rate and regular rhythm.  Pulmonary/Chest: Effort normal and breath sounds normal.  Abdominal: Soft. Bowel sounds are normal.  Musculoskeletal: Normal range of motion.  Neurological: He is alert and oriented to person, place, and time.  Skin: Skin is warm and dry.  Psychiatric: He has a normal mood and affect. His behavior is normal.  Nursing note and vitals reviewed.    ED Treatments / Results  Labs (all labs ordered are listed, but only abnormal results are displayed) Labs Reviewed  URINALYSIS, ROUTINE W REFLEX MICROSCOPIC - Abnormal; Notable for the following components:      Result Value   Color, Urine COLORLESS (*)    Specific Gravity, Urine 1.002 (*)    Hgb urine dipstick SMALL (*)    All other components within normal limits  CBC WITH DIFFERENTIAL/PLATELET - Abnormal; Notable for the following components:   Neutro Abs 1.5 (*)    All other components within normal limits  ETHANOL - Abnormal; Notable for the following components:   Alcohol, Ethyl (B) 264 (*)    All other components within normal limits  BASIC METABOLIC PANEL  RAPID URINE DRUG SCREEN, HOSP PERFORMED    EKG  EKG Interpretation  Date/Time:  Tuesday February 21 2017 12:17:57  EST Ventricular Rate:  99 PR Interval:    QRS Duration: 94 QT Interval:  369 QTC Calculation: 474 R Axis:   56 Text Interpretation:  Sinus rhythm Probable left atrial enlargement Probable anteroseptal infarct, old Confirmed by Donnetta Hutching ([id number]) on 02/21/2017 1:22:56 PM       Radiology Ct Head Wo Contrast  Result Date: 02/21/2017 CLINICAL DATA:  Seizures. Headache. Dizziness. No reported injury. EXAM: CT HEAD WITHOUT CONTRAST TECHNIQUE: Contiguous axial images were obtained from the base of the skull through the vertex without intravenous contrast. COMPARISON:  12/08/2012 head CT. FINDINGS: Brain: No evidence of parenchymal hemorrhage or extra-axial fluid collection. No mass lesion, mass effect, or midline  shift. No CT evidence of acute infarction. Cerebral volume is age appropriate. No ventriculomegaly. Vascular: No acute abnormality. Skull: No evidence of calvarial fracture. Sinuses/Orbits: The visualized paranasal sinuses are essentially clear. Other: Asymmetric fatty atrophy of the left parotid gland. The mastoid air cells are unopacified. IMPRESSION: No evidence of acute intracranial abnormality. No evidence of calvarial fracture. Electronically Signed   By: Delbert Phenix M.D.   On: 02/21/2017 02:51    Procedures Procedures (including critical care time)  Medications Ordered in ED Medications  sodium chloride 0.9 % bolus 1,000 mL (0 mLs Intravenous Stopped 02/21/17 1522)  acetaminophen (TYLENOL) tablet 1,000 mg (1,000 mg Oral Given 02/21/17 1522)  ibuprofen (ADVIL,MOTRIN) tablet 800 mg (800 mg Oral Given 02/21/17 1521)     Initial Impression / Assessment and Plan / ED Course  I have reviewed the triage vital signs and the nursing notes.  Pertinent labs & imaging results that were available during my care of the patient were reviewed by me and considered in my medical decision making (see chart for details).     Patient and girlfriend report staring episodes.  Normal physical exam in the emergency department.  None of these episodes were noted in the 2-3 hours he was here.  Alcohol level 264.  Discussed with neurologist on call.  Before I could discuss these findings with the patient, he decided to leave AMA.  No neuro deficits at discharge  Final Clinical Impressions(s) / ED Diagnoses   Final diagnoses:  Staring episodes    ED Discharge Orders    None       Donnetta Hutching, MD 02/21/17 1706

## 2017-02-21 NOTE — ED Triage Notes (Signed)
Pt brought in by [facility] for c/o seizures last night and earlier today; family reported pt had a seizure today and has been staring off; pt c/o headache and blurred vision

## 2017-02-21 NOTE — ED Notes (Signed)
Pt states that after leaving AMA and got into the car, he had another seizure, staff went to vehicle and pt remembers having the seizure and got out of the vehicle and got into the wheelchair without assistance, pt was able to get out of wheelchair and ambulate in room and get into bed with no assistance

## 2017-02-21 NOTE — ED Triage Notes (Signed)
[facility] focal seizure witnessed by girlfriend and fire dept that lasted about 8 mins, [facility] has had 2 more that they have witnessed, has no history of seizures but feels like hes been having them for a cpl months, v/s: 150/106, CBG: 120, NSR on monitor, 0.5mg  of Versed en route

## 2017-02-21 NOTE — Discharge Instructions (Signed)
Your tests tonight are normal.  Please try to avoid things in your life that make you upset or stressed out.  Please call the neurologist office to get a EEG to make sure you do not have a area in your brain that is causing seizure activity.  You should not drive until the neurologist says you can.

## 2017-02-21 NOTE — ED Notes (Signed)
Date and time results received: 02/21/17 1:34 AM   Test: ETOH Critical Value: 309  Name of Provider Notified: Dr Lynelle DoctorKnapp   Orders Received? Or Actions Taken?: Dr Lynelle DoctorKnapp notified

## 2017-02-21 NOTE — ED Provider Notes (Signed)
[facility] EMERGENCY DEPARTMENT Provider Note   CSN: [id number] Arrival date & time: 02/21/17  0031  Time seen 12:39 AM   History   Chief Complaint Chief Complaint  Patient presents with  . Seizures    HPI JOHNANTHONY WILDEN is a 42 y.o. male.  HPI per EMS girlfriend called them to their home stating patient had been having staring seizures.  EMS states they witnessed 2 episodes where he would just stare.  They deny any shaking.  They did report he got tense all over.  He immediately then started talking.  They gave him Versed 5 mg in route.  When I asked patient about what happens he is very evasive and does not want to answer any questions.  He states he has had bilateral temporal and frontal headache for months.  Please note that whenever I asked him as he was a "a long time".  He also states he is losing vision in both eyes "for a long time" which is 4-5 months.  He states he has numbness on the top of both of his hands "for a long time".  For 3-4 months.  He denies any unusual stress in his life.  Patient states he supposed to be taking blood pressure medication but he stopped them because "I do not believe in them".  PCP none  Past Medical History:  Diagnosis Date  . Hypertension     There are no active problems to display for this patient.   Past Surgical History:  Procedure Laterality Date  . FACIAL COSMETIC SURGERY         Home Medications    Prior to Admission medications   Medication Sig Start Date End Date Taking? Authorizing Provider  cephALEXin (KEFLEX) 500 MG capsule Take 1 capsule (500 mg total) by mouth 3 (three) times daily. Patient not taking: Reported on 07/12/2014 05/07/14   Geoffery Lyons, MD  diclofenac (VOLTAREN) 75 MG EC tablet Take 1 tablet (75 mg total) by mouth 2 (two) times daily. Patient not taking: Reported on 07/12/2014 04/28/14   Ivery Quale, PA-C  HYDROcodone-acetaminophen (NORCO/VICODIN) 5-325 MG tablet Take one-two tabs po q 4-6 hrs prn pain  12/23/14   Triplett, Tammy, PA-C  ibuprofen (ADVIL,MOTRIN) 800 MG tablet Take 1 tablet (800 mg total) by mouth 3 (three) times daily. Patient not taking: Reported on 07/12/2014 08/29/13   Pauline Aus, PA-C  naproxen (NAPROSYN) 500 MG tablet Take 1 tablet (500 mg total) by mouth 2 (two) times daily with a meal. 12/23/14   Triplett, Tammy, PA-C  penicillin v potassium (VEETID) 500 MG tablet Take 1 tablet (500 mg total) by mouth 3 (three) times daily. Patient not taking: Reported on 07/12/2014 01/19/14   Geoffery Lyons, MD  traMADol Janean Sark) 50 MG tablet 1 or 2 po q6h prn pain Patient not taking: Reported on 07/12/2014 04/28/14   Ivery Quale, PA-C    Family History History reviewed. No pertinent family history.  Social History Social History   Tobacco Use  . Smoking status: Current Every Day Smoker    Packs/day: 0.50    Types: Cigarettes  . Smokeless tobacco: Never Used  Substance Use Topics  . Alcohol use: Yes    Alcohol/week: 0.6 - 1.2 oz    Types: 1 - 2 Cans of beer per week    Comment: occasionally   . Drug use: No  States he smokes 1/5 pack a day When asked what kind of work he does he states "everything".   Allergies  Onion   Review of Systems Review of Systems  All other systems reviewed and are negative.    Physical Exam Updated Vital Signs BP (!) 149/122 (BP Location: Left Arm)   Pulse 94   Temp 98.4 F (36.9 C) (Oral)   Resp 18   Ht 5\' 9"  (1.753 m)   Wt 81.6 kg (180 lb)   SpO2 99%   BMI 26.58 kg/m   Physical Exam  Constitutional: He is oriented to person, place, and time. He appears well-developed and well-nourished.  Non-toxic appearance. He does not appear ill. No distress.  HENT:  Head: Normocephalic and atraumatic.  Right Ear: External ear normal.  Left Ear: External ear normal.  Nose: Nose normal. No mucosal edema or rhinorrhea.  Mouth/Throat: Oropharynx is clear and moist and mucous membranes are normal. No dental abscesses or uvula swelling.    No trauma to tongue  Eyes: Conjunctivae and EOM are normal. Pupils are equal, round, and reactive to light.  Neck: Normal range of motion and full passive range of motion without pain. Neck supple.  Cardiovascular: Normal rate, regular rhythm and normal heart sounds. Exam reveals no gallop and no friction rub.  No murmur heard. Pulmonary/Chest: Effort normal and breath sounds normal. No respiratory distress. He has no wheezes. He has no rhonchi. He has no rales. He exhibits no tenderness and no crepitus.  Abdominal: Soft. Normal appearance and bowel sounds are normal. He exhibits no distension. There is no tenderness. There is no rebound and no guarding.  Musculoskeletal: Normal range of motion. He exhibits no edema or tenderness.  Moves all extremities well.   Neurological: He is alert and oriented to person, place, and time. He has normal strength. No cranial nerve deficit.  No pronator drift, no motor weakness in his extremities.  Patient has no facial asymmetry.  Please note patient had a "episode" while I was in the room.  He just stared into space.  When I moved my hand towards his eyes he did not blink.  However when I dropped his hand over his face/head he missed hitting his head 3 times.  He immediately started talking afterwards and answering questions.  Skin: Skin is warm, dry and intact. No rash noted. No erythema. No pallor.  Psychiatric: His speech is normal. His mood appears not anxious. His affect is labile. He is agitated.  Patient becomes very upset when asking many questions about his symptoms.  He keeps saying "I am telling the truth".  He also answers questions very vaguely stating "a long time".  Nursing note and vitals reviewed.    ED Treatments / Results  Labs (all labs ordered are listed, but only abnormal results are displayed) Results for orders placed or performed during the hospital encounter of 02/21/17  Comprehensive metabolic panel  Result Value Ref Range    Sodium 139 135 - 145 mmol/L   Potassium 3.6 3.5 - 5.1 mmol/L   Chloride 99 (L) 101 - 111 mmol/L   CO2 26 22 - 32 mmol/L   Glucose, Bld 107 (H) 65 - 99 mg/dL   BUN 7 6 - 20 mg/dL   Creatinine, Ser 4.54 0.61 - 1.24 mg/dL   Calcium 8.8 (L) 8.9 - 10.3 mg/dL   Total Protein 8.0 6.5 - 8.1 g/dL   Albumin 4.8 3.5 - 5.0 g/dL   AST 37 15 - 41 U/L   ALT 34 17 - 63 U/L   Alkaline Phosphatase 53 38 - 126 U/L   Total Bilirubin  1.2 0.3 - 1.2 mg/dL   GFR calc non Af Amer >60 >60 mL/min   GFR calc Af Amer >60 >60 mL/min   Anion gap 14 5 - 15  Ethanol  Result Value Ref Range   Alcohol, Ethyl (B) 309 (HH) <10 mg/dL  CBC with Differential  Result Value Ref Range   WBC 7.2 4.0 - 10.5 K/uL   RBC 5.00 4.22 - 5.81 MIL/uL   Hemoglobin 15.5 13.0 - 17.0 g/dL   HCT 96.0 45.4 - 09.8 %   MCV 92.2 78.0 - 100.0 fL   MCH 31.0 26.0 - 34.0 pg   MCHC 33.6 30.0 - 36.0 g/dL   RDW 11.9 14.7 - 82.9 %   Platelets 273 150 - 400 K/uL   Neutrophils Relative % 27 %   Neutro Abs 2.0 1.7 - 7.7 K/uL   Lymphocytes Relative 62 %   Lymphs Abs 4.4 (H) 0.7 - 4.0 K/uL   Monocytes Relative 7 %   Monocytes Absolute 0.5 0.1 - 1.0 K/uL   Eosinophils Relative 3 %   Eosinophils Absolute 0.2 0.0 - 0.7 K/uL   Basophils Relative 1 %   Basophils Absolute 0.1 0.0 - 0.1 K/uL  Troponin I  Result Value Ref Range   Troponin I <0.03 <0.03 ng/mL  Magnesium  Result Value Ref Range   Magnesium 2.1 1.7 - 2.4 mg/dL   Laboratory interpretation all normal except alcohol intoxication    EKG  EKG Interpretation  Date/Time:  Tuesday February 21 2017 00:45:34 EST Ventricular Rate:  97 PR Interval:    QRS Duration: 98 QT Interval:  371 QTC Calculation: 472 R Axis:   57 Text Interpretation:  Sinus rhythm Probable left atrial enlargement Baseline wander No significant change since last tracing 17 Mar 2013 Confirmed by Devoria Albe ([id number]) on 02/21/2017 12:56:43 AM       Radiology No results found.  Procedures Procedures (including  critical care time)  Medications Ordered in ED Medications - No data to display   Initial Impression / Assessment and Plan / ED Course  I have reviewed the triage vital signs and the nursing notes.  Pertinent labs & imaging results that were available during my care of the patient were reviewed by me and considered in my medical decision making (see chart for details).     Laboratory testing was done to evaluate him for possible seizures although at this point I think they are pseudoseizures or psychogenic nonepileptic seizures.  1:20 AM nursing staff report patient wants to sign out AMA.  At this point I do not see any reason to prevent him making that decision.  CT scan of the head had been ordered to evaluate his headaches and complaints of loss of vision.  However patient left before prior to getting his scan done.  He also left before providing for his UDS.  1:35 AM nurses report his alcohol was 309.  Patient had left with his girlfriend.  2:10 AM nurses report patient returned to the ED with his girlfriend because he had a "seizure" in the car on the way home.  Head CT was reordered and UDS was reordered.   Recheck at 3:40 AM patient is sleeping however his girlfriend and her mother at the bedside.  We discussed his test results.  When I asked her if he has been under any extra stress she states he had an argument this evening with a friend of his.  She states she has noticed every time he interacts  with his friend he has these episodes.  We discussed following up with a neurologist to do an EEG however I think these seizures are nonepileptic.  Hopefully removing the stress from his life will make them go away.  I discussed if his girlfriend as long as he starts talking immediately after the episodes that she does not need to be concerned.  She also cautioned that he should not drive and she states he does not drive anyway.  Final Clinical Impressions(s) / ED Diagnoses   Final  diagnoses:  Psychogenic nonepileptic seizure  Alcoholic intoxication without complication [facility](HCC)    Plan discharge  Devoria AlbeIva Jatavius Ellenwood, MD, Concha PyoFACEP    Ledon Weihe, MD 02/21/17 [phone]

## 2017-02-22 LAB — CBC
HEMATOCRIT: 45.6 % (ref 39.0–52.0)
HEMOGLOBIN: 15.2 g/dL (ref 13.0–17.0)
MCH: 31 pg (ref 26.0–34.0)
MCHC: 33.3 g/dL (ref 30.0–36.0)
MCV: 92.9 fL (ref 78.0–100.0)
Platelets: 289 10*3/uL (ref 150–400)
RBC: 4.91 MIL/uL (ref 4.22–5.81)
RDW: 13.6 % (ref 11.5–15.5)
WBC: 5.3 10*3/uL (ref 4.0–10.5)

## 2017-02-22 LAB — BASIC METABOLIC PANEL
ANION GAP: 11 (ref 5–15)
BUN: 8 mg/dL (ref 6–20)
CHLORIDE: 107 mmol/L (ref 101–111)
CO2: 24 mmol/L (ref 22–32)
Calcium: 8.3 mg/dL — ABNORMAL LOW (ref 8.9–10.3)
Creatinine, Ser: 1.02 mg/dL (ref 0.61–1.24)
GFR calc non Af Amer: >60 mL/min (ref >60)
Glucose, Bld: 110 mg/dL — ABNORMAL HIGH (ref 65–99)
Potassium: 4.1 mmol/L (ref 3.5–5.1)
Sodium: 142 mmol/L (ref 135–145)

## 2017-02-22 LAB — ETHANOL: Alcohol, Ethyl (B): 264 mg/dL — ABNORMAL HIGH (ref <10)

## 2017-02-22 NOTE — ED Notes (Signed)
Pt woke up asking this nurse if he could go home. Informed pt that he could be discharged if he could find a ride home. Pt given phone to attempt in finding ride home.

## 2017-02-22 NOTE — ED Provider Notes (Signed)
[facility] Provider Note   CSN: [id number] Arrival date & time: 02/21/17  2202     History   Chief Complaint Chief Complaint  Patient presents with  . Seizures    HPI Paul Lambert is a 42 y.o. male.  Patient brought to the ER from home for evaluation of possible seizure.  Patient was seen yesterday for similar symptoms.  Family reported having episodes of staring and being unresponsive.  He left the hospital last night with instructions to follow-up with neurology, but symptoms recurred at some point today.  At arrival to the ER, patient is somnolent and not able to answer questions fully.      Past Medical History:  Diagnosis Date  . Hypertension   . Seizures (HCC)     There are no active problems to display for this patient.   Past Surgical History:  Procedure Laterality Date  . FACIAL COSMETIC SURGERY         Home Medications    Prior to Admission medications   Not on File    Family History History reviewed. No pertinent family history.  Social History Social History   Tobacco Use  . Smoking status: Current Every Day Smoker    Packs/day: 0.50    Types: Cigarettes  . Smokeless tobacco: Never Used  Substance Use Topics  . Alcohol use: Yes    Alcohol/week: 0.6 - 1.2 oz    Types: 1 - 2 Cans of beer per week    Comment: occasionally   . Drug use: No     Allergies   Onion   Review of Systems Review of Systems  Unable to perform ROS: Patient nonverbal     Physical Exam Updated Vital Signs BP 124/74   Pulse 80   Temp 98.5 F (36.9 C) (Oral)   Resp 17   Ht 5\' 9"  (1.753 m)   Wt 81.6 kg (180 lb)   SpO2 99%   BMI 26.58 kg/m   Physical Exam  Constitutional: He appears well-developed and well-nourished. No distress.  HENT:  Head: Normocephalic and atraumatic.  Right Ear: Hearing normal.  Left Ear: Hearing normal.  Nose: Nose normal.  Mouth/Throat: Oropharynx is clear and moist and mucous membranes are normal.   Eyes: Conjunctivae and EOM are normal. Pupils are equal, round, and reactive to light.  Neck: Normal range of motion. Neck supple.  Cardiovascular: Regular rhythm, S1 normal and S2 normal. Exam reveals no gallop and no friction rub.  No murmur heard. Pulmonary/Chest: Effort normal and breath sounds normal. No respiratory distress. He exhibits no tenderness.  Abdominal: Soft. Normal appearance and bowel sounds are normal. There is no hepatosplenomegaly. There is no tenderness. There is no rebound, no guarding, no tenderness at McBurney's point and negative Murphy's sign. No hernia.  Musculoskeletal: Normal range of motion.  Neurological: No cranial nerve deficit or sensory deficit. Coordination normal. GCS eye subscore is 4. GCS verbal subscore is 5. GCS motor subscore is 6.  somnolent  Skin: Skin is warm, dry and intact. No rash noted. No cyanosis.  Psychiatric: He has a normal mood and affect. His speech is normal and behavior is normal. Thought content normal.  Nursing note and vitals reviewed.    ED Treatments / Results  Labs (all labs ordered are listed, but only abnormal results are displayed) Labs Reviewed  BASIC METABOLIC PANEL - Abnormal; Notable for the following components:      Result Value   Glucose, Bld 110 (*)  Calcium 8.3 (*)    All other components within normal limits  ETHANOL - Abnormal; Notable for the following components:   Alcohol, Ethyl (B) 264 (*)    All other components within normal limits  CBG MONITORING, ED - Abnormal; Notable for the following components:   Glucose-Capillary 104 (*)    All other components within normal limits  CBC    EKG  EKG Interpretation  Date/Time:  Tuesday February 21 2017 22:07:15 EST Ventricular Rate:  95 PR Interval:    QRS Duration: 93 QT Interval:  362 QTC Calculation: 456 R Axis:   67 Text Interpretation:  Sinus rhythm Normal ECG Confirmed by Gilda Crease [phone]) on 02/22/2017 12:12:40 AM        Radiology Ct Head Wo Contrast  Result Date: 02/21/2017 CLINICAL DATA:  Seizures. Headache. Dizziness. No reported injury. EXAM: CT HEAD WITHOUT CONTRAST TECHNIQUE: Contiguous axial images were obtained from the base of the skull through the vertex without intravenous contrast. COMPARISON:  12/08/2012 head CT. FINDINGS: Brain: No evidence of parenchymal hemorrhage or extra-axial fluid collection. No mass lesion, mass effect, or midline shift. No CT evidence of acute infarction. Cerebral volume is age appropriate. No ventriculomegaly. Vascular: No acute abnormality. Skull: No evidence of calvarial fracture. Sinuses/Orbits: The visualized paranasal sinuses are essentially clear. Other: Asymmetric fatty atrophy of the left parotid gland. The mastoid air cells are unopacified. IMPRESSION: No evidence of acute intracranial abnormality. No evidence of calvarial fracture. Electronically Signed   By: Delbert Phenix M.D.   On: 02/21/2017 02:51    Procedures Procedures (including critical care time)  Medications Ordered in ED Medications - No data to display   Initial Impression / Assessment and Plan / ED Course  I have reviewed the triage vital signs and the nursing notes.  Pertinent labs & imaging results that were available during my care of the patient were reviewed by me and considered in my medical decision making (see chart for details).     Patient returns to the ER for evaluation of possible seizure activity.  Family reportedly told EMS that he has been experiencing additional episodes of staring and they are concerned for possible seizure.  Family has not come to the ER.  Patient was just seen earlier today for similar episodes.  Would not stay for neurologic evaluation.  Was discharged and told to follow-up as an outpatient.  I have not seen any additional episodes here.  Patient is obviously very intoxicated at arrival.  He was somnolent initially and has slowly become more alert.  I expect  that all of his symptoms are secondary to his alcohol abuse.  There is nothing to indicate obvious seizures, but agree with outpatient follow-up with neurology.  Final Clinical Impressions(s) / ED Diagnoses   Final diagnoses:  Alcohol intoxication with delirium [facility])    ED Discharge Orders    None       Pollina, Canary Brim, MD 02/22/17 [phone]

## 2017-02-22 NOTE — ED Notes (Signed)
ED Provider at bedside. 

## 2017-02-22 NOTE — ED Notes (Signed)
Pt ambulatory to bathroom and back to room with no assistance. Steady gait noted. Pt given gingerale per request, consumed with no problems. Pt alert and oriented x 4. Pt attempting to find ride home at this time.

## 2017-02-22 NOTE — ED Notes (Signed)
Encouraged pt to wake up and move to top of bed. Pt stood up with assistance, unsteady gait with taking steps to head of bed. Pt then sat down and lied in bed. Pt given pillow and covered with sheet for comfort.

## 2017-07-13 ENCOUNTER — Emergency Department (HOSPITAL_COMMUNITY)
Admission: EM | Admit: 2017-07-13 | Discharge: 2017-07-14 | Discharge status: Against Medical Advice | Payer: Self-pay | Attending: Emergency Medicine | Admitting: Emergency Medicine

## 2017-07-13 ENCOUNTER — Other Ambulatory Visit: Payer: Self-pay

## 2017-07-13 ENCOUNTER — Encounter (HOSPITAL_COMMUNITY): Payer: Self-pay

## 2017-07-13 DIAGNOSIS — I10 Essential (primary) hypertension: Secondary | ICD-10-CM | POA: Insufficient documentation

## 2017-07-13 DIAGNOSIS — R0789 Other chest pain: Secondary | ICD-10-CM | POA: Insufficient documentation

## 2017-07-13 DIAGNOSIS — F17210 Nicotine dependence, cigarettes, uncomplicated: Secondary | ICD-10-CM | POA: Insufficient documentation

## 2017-07-13 NOTE — ED Triage Notes (Signed)
Pt reports left chest pain x 2 days, sharp in nature, worse with deep breath at times.  Pt received 1nitro and 4 baby aspirin.  Pt states he had mild improvement

## 2017-07-14 ENCOUNTER — Other Ambulatory Visit: Payer: Self-pay

## 2017-07-14 ENCOUNTER — Emergency Department (HOSPITAL_COMMUNITY): Payer: Self-pay

## 2017-07-14 LAB — COMPREHENSIVE METABOLIC PANEL
ALT: 31 U/L (ref 17–63)
AST: 24 U/L (ref 15–41)
Albumin: 4.6 g/dL (ref 3.5–5.0)
Alkaline Phosphatase: 45 U/L (ref 38–126)
Anion gap: 9 (ref 5–15)
BILIRUBIN TOTAL: 0.9 mg/dL (ref 0.3–1.2)
BUN: 8 mg/dL (ref 6–20)
CALCIUM: 9.1 mg/dL (ref 8.9–10.3)
CHLORIDE: 107 mmol/L (ref 101–111)
CO2: 23 mmol/L (ref 22–32)
CREATININE: 1.02 mg/dL (ref 0.61–1.24)
Glucose, Bld: 95 mg/dL (ref 65–99)
Potassium: 3.8 mmol/L (ref 3.5–5.1)
Sodium: 139 mmol/L (ref 135–145)
TOTAL PROTEIN: 7.7 g/dL (ref 6.5–8.1)

## 2017-07-14 LAB — CBC
HEMATOCRIT: 45.9 % (ref 39.0–52.0)
HEMOGLOBIN: 15.4 g/dL (ref 13.0–17.0)
MCH: 30.9 pg (ref 26.0–34.0)
MCHC: 33.6 g/dL (ref 30.0–36.0)
MCV: 92 fL (ref 78.0–100.0)
Platelets: 275 10*3/uL (ref 150–400)
RBC: 4.99 MIL/uL (ref 4.22–5.81)
RDW: 12.5 % (ref 11.5–15.5)
WBC: 5.9 10*3/uL (ref 4.0–10.5)

## 2017-07-14 LAB — I-STAT TROPONIN, ED: Troponin i, poc: 0.01 ng/mL (ref 0.00–0.08)

## 2017-07-14 LAB — D-DIMER, QUANTITATIVE (NOT AT ARMC): D DIMER QUANT: 0.47 ug{FEU}/mL (ref 0.00–0.50)

## 2017-07-14 MED ORDER — ACETAMINOPHEN 500 MG PO TABS
1000.0000 mg | ORAL_TABLET | Freq: Once | ORAL | Status: AC
  Administered 2017-07-14: 1000 mg via ORAL
  Filled 2017-07-14: qty 2

## 2017-07-14 NOTE — ED Notes (Signed)
Pt hollered out from the room stating he wants to leave.  This nurse asked the patient if he did not want to wait for his test results and he stated no.  IV removed from pt and the patient left.  Informed patient to please return to the e.d. If condition worsens.  Pt agreeable, ambulatory without diff

## 2017-07-14 NOTE — ED Provider Notes (Signed)
[facility]   CSN: [id number] Arrival date & time: 07/13/17  2341  Time seen 12:16 AM    History   Chief Complaint Chief Complaint  Patient presents with  . Chest Pain    HPI Manuella GhaziClyde L Volante is a 42 y.o. male.  HPI patient reports 2 days ago he started getting episodes of left anterior chest pain that he describes as sharp and lasting a second.  He states taking big deep breaths makes the pain come on, breathing shallow prevents the pain from happening.  Nothing else makes the pain come on.  He denies shortness of breath, nausea, vomiting, pain or swelling of his extremities, coughing, or fever.  He states he has had these pains before off and on for a year or so.  He just states the last 2 days they are happening more often.  He has a history of hypertension on no medication and his blood pressure is normal in the ED.  He denies any family history of coronary artery disease.  He states he smokes but he states a pack of cigarettes will last him a week.  EMS gave 1 nitroglycerin and 4 baby aspirin on the way to the ED, patient now has a headache.  PCP none  Past Medical History:  Diagnosis Date  . Hypertension   . Seizures (HCC)     There are no active problems to display for this patient.   Past Surgical History:  Procedure Laterality Date  . FACIAL COSMETIC SURGERY          Home Medications    Prior to Admission medications   Not on File    Family History No family history on file.  Social History Social History   Tobacco Use  . Smoking status: Current Every Day Smoker    Packs/day: 0.50    Types: Cigarettes  . Smokeless tobacco: Never Used  Substance Use Topics  . Alcohol use: Yes    Alcohol/week: 0.6 - 1.2 oz    Types: 1 - 2 Cans of beer per week    Comment: occasionally   . Drug use: No  employed   Allergies   Onion   Review of Systems Review of Systems  All other systems reviewed and are  negative.    Physical Exam Updated Vital Signs BP 124/64 (BP Location: Left Arm)   Pulse 82   Temp 98.1 F (36.7 C) (Oral)   Resp 15   Ht 5\' 9"  (1.753 m)   Wt 77.1 kg (170 lb)   SpO2 98%   BMI 25.10 kg/m   Vital signs normal    Physical Exam  Constitutional: He is oriented to person, place, and time. He appears well-developed and well-nourished.  Non-toxic appearance. He does not appear ill. No distress.  HENT:  Head: Normocephalic and atraumatic.  Right Ear: External ear normal.  Left Ear: External ear normal.  Nose: Nose normal. No mucosal edema or rhinorrhea.  Mouth/Throat: Oropharynx is clear and moist and mucous membranes are normal. No dental abscesses or uvula swelling.  Eyes: Pupils are equal, round, and reactive to light. Conjunctivae and EOM are normal.  Neck: Normal range of motion and full passive range of motion without pain. Neck supple.  Cardiovascular: Normal rate, regular rhythm and normal heart sounds. Exam reveals no gallop and no friction rub.  No murmur heard. Pulmonary/Chest: Effort normal and breath sounds normal. No respiratory distress. He has no wheezes. He has no rhonchi. He  has no rales. He exhibits no tenderness and no crepitus.  Area of pain noted    Abdominal: Soft. Normal appearance and bowel sounds are normal. He exhibits no distension. There is no tenderness. There is no rebound and no guarding.  Musculoskeletal: Normal range of motion. He exhibits no edema or tenderness.  Moves all extremities well.   Neurological: He is alert and oriented to person, place, and time. He has normal strength. No cranial nerve deficit.  Skin: Skin is warm, dry and intact. No rash noted. No erythema. No pallor.  Psychiatric: He has a normal mood and affect. His speech is normal and behavior is normal. His mood appears not anxious.  Nursing note and vitals reviewed.    ED Treatments / Results  Labs (all labs ordered are listed, but only abnormal results  are displayed) Results for orders placed or performed during the hospital encounter of 07/13/17  CBC  Result Value Ref Range   WBC 5.9 4.0 - 10.5 K/uL   RBC 4.99 4.22 - 5.81 MIL/uL   Hemoglobin 15.4 13.0 - 17.0 g/dL   HCT 29.5 62.1 - 30.8 %   MCV 92.0 78.0 - 100.0 fL   MCH 30.9 26.0 - 34.0 pg   MCHC 33.6 30.0 - 36.0 g/dL   RDW 65.7 84.6 - 96.2 %   Platelets 275 150 - 400 K/uL  Comprehensive metabolic panel  Result Value Ref Range   Sodium 139 135 - 145 mmol/L   Potassium 3.8 3.5 - 5.1 mmol/L   Chloride 107 101 - 111 mmol/L   CO2 23 22 - 32 mmol/L   Glucose, Bld 95 65 - 99 mg/dL   BUN 8 6 - 20 mg/dL   Creatinine, Ser 9.52 0.61 - 1.24 mg/dL   Calcium 9.1 8.9 - 84.1 mg/dL   Total Protein 7.7 6.5 - 8.1 g/dL   Albumin 4.6 3.5 - 5.0 g/dL   AST 24 15 - 41 U/L   ALT 31 17 - 63 U/L   Alkaline Phosphatase 45 38 - 126 U/L   Total Bilirubin 0.9 0.3 - 1.2 mg/dL   GFR calc non Af Amer >60 >60 mL/min   GFR calc Af Amer >60 >60 mL/min   Anion gap 9 5 - 15  D-dimer, quantitative  Result Value Ref Range   D-Dimer, Quant 0.47 0.00 - 0.50 ug/mL-FEU  I-stat troponin, ED  Result Value Ref Range   Troponin i, poc 0.01 0.00 - 0.08 ng/mL   Comment 3           Laboratory interpretation all normal     EKG EKG Interpretation  Date/Time:  Thursday July 13 2017 23:44:38 EDT Ventricular Rate:  89 PR Interval:    QRS Duration: 90 QT Interval:  367 QTC Calculation: 447 R Axis:   74 Text Interpretation:  Sinus rhythm Nonspecific T abnormalities, lateral leads Baseline wander in lead(s) I III aVL No significant change since last tracing 21 Feb 2017 Confirmed by Devoria Albe ([id number]) on 07/14/2017 12:01:59 AM   Radiology Dg Chest 2 View  Result Date: 07/14/2017 CLINICAL DATA:  Acute onset of mid to left-sided chest pain, extending to the left shoulder. Shortness of breath. EXAM: CHEST - 2 VIEW COMPARISON:  Chest radiograph performed 04/17/2004 FINDINGS: The lungs are well-aerated and clear. There  is no evidence of focal opacification, pleural effusion or pneumothorax. The heart is normal in size; the mediastinal contour is within normal limits. No acute osseous abnormalities are seen. IMPRESSION: No acute cardiopulmonary  process seen. Electronically Signed   By: Roanna Raider M.D.   On: 07/14/2017 01:07    Procedures Procedures (including critical care time)  Medications Ordered in ED Medications  acetaminophen (TYLENOL) tablet 1,000 mg (1,000 mg Oral Given 07/14/17 0007)     Initial Impression / Assessment and Plan / ED Course  I have reviewed the triage vital signs and the nursing notes.  Pertinent labs & imaging results that were available during my care of the patient were reviewed by me and considered in my medical decision making (see chart for details).    Patient was given acetaminophen for his nitroglycerin headache.  Laboratory testing and chest x-ray was done.  We discussed generally heart pain is not going to only last a second and have continuous little jabbing pains.  About the time I was looking at his chart and his tests had all resulted nurses told me that patient had signed out AMA.  Final Clinical Impressions(s) / ED Diagnoses   Final diagnoses:  Atypical chest pain    Pt left AMA  Devoria Albe, MD, Concha Pyo, MD 07/14/17 [phone]

## 2017-11-20 ENCOUNTER — Other Ambulatory Visit: Payer: Self-pay

## 2017-11-20 ENCOUNTER — Encounter (HOSPITAL_COMMUNITY): Payer: Self-pay | Admitting: Internal Medicine

## 2017-11-20 ENCOUNTER — Emergency Department (HOSPITAL_COMMUNITY): Payer: Self-pay

## 2017-11-20 ENCOUNTER — Inpatient Hospital Stay (HOSPITAL_COMMUNITY)
Admission: EM | Admit: 2017-11-20 | Discharge: 2017-11-23 | DRG: 683 | Discharge status: Against Medical Advice | Payer: Self-pay | Attending: Family Medicine | Admitting: Family Medicine

## 2017-11-20 DIAGNOSIS — F10929 Alcohol use, unspecified with intoxication, unspecified: Secondary | ICD-10-CM

## 2017-11-20 DIAGNOSIS — E872 Acidosis: Secondary | ICD-10-CM | POA: Diagnosis present

## 2017-11-20 DIAGNOSIS — N179 Acute kidney failure, unspecified: Principal | ICD-10-CM | POA: Diagnosis present

## 2017-11-20 DIAGNOSIS — F1490 Cocaine use, unspecified, uncomplicated: Secondary | ICD-10-CM | POA: Diagnosis present

## 2017-11-20 DIAGNOSIS — F1410 Cocaine abuse, uncomplicated: Secondary | ICD-10-CM | POA: Diagnosis present

## 2017-11-20 DIAGNOSIS — F1020 Alcohol dependence, uncomplicated: Secondary | ICD-10-CM | POA: Diagnosis present

## 2017-11-20 DIAGNOSIS — F17210 Nicotine dependence, cigarettes, uncomplicated: Secondary | ICD-10-CM | POA: Diagnosis present

## 2017-11-20 DIAGNOSIS — E876 Hypokalemia: Secondary | ICD-10-CM | POA: Diagnosis present

## 2017-11-20 DIAGNOSIS — I10 Essential (primary) hypertension: Secondary | ICD-10-CM | POA: Diagnosis present

## 2017-11-20 DIAGNOSIS — F10221 Alcohol dependence with intoxication delirium: Secondary | ICD-10-CM | POA: Diagnosis present

## 2017-11-20 DIAGNOSIS — R569 Unspecified convulsions: Secondary | ICD-10-CM

## 2017-11-20 DIAGNOSIS — G40909 Epilepsy, unspecified, not intractable, without status epilepticus: Secondary | ICD-10-CM | POA: Diagnosis present

## 2017-11-20 DIAGNOSIS — F10921 Alcohol use, unspecified with intoxication delirium: Secondary | ICD-10-CM | POA: Diagnosis present

## 2017-11-20 DIAGNOSIS — E860 Dehydration: Secondary | ICD-10-CM | POA: Diagnosis present

## 2017-11-20 DIAGNOSIS — E8729 Other acidosis: Secondary | ICD-10-CM | POA: Diagnosis present

## 2017-11-20 DIAGNOSIS — Z23 Encounter for immunization: Secondary | ICD-10-CM

## 2017-11-20 DIAGNOSIS — Y906 Blood alcohol level of 120-199 mg/100 ml: Secondary | ICD-10-CM | POA: Diagnosis present

## 2017-11-20 DIAGNOSIS — M6282 Rhabdomyolysis: Secondary | ICD-10-CM | POA: Diagnosis present

## 2017-11-20 DIAGNOSIS — F29 Unspecified psychosis not due to a substance or known physiological condition: Secondary | ICD-10-CM

## 2017-11-20 DIAGNOSIS — D751 Secondary polycythemia: Secondary | ICD-10-CM | POA: Diagnosis present

## 2017-11-20 LAB — CBC
HCT: 58 % — ABNORMAL HIGH (ref 39.0–52.0)
HCT: 40.8 % (ref 39.0–52.0)
HEMOGLOBIN: 13.6 g/dL (ref 13.0–17.0)
Hemoglobin: 17.2 g/dL — ABNORMAL HIGH (ref 13.0–17.0)
MCH: 31.6 pg (ref 26.0–34.0)
MCH: 30.8 pg (ref 26.0–34.0)
MCHC: 29.7 g/dL — AB (ref 30.0–36.0)
MCHC: 33.3 g/dL (ref 30.0–36.0)
MCV: 106.4 fL — AB (ref 80.0–100.0)
MCV: 92.5 fL (ref 80.0–100.0)
PLATELETS: 188 10*3/uL (ref 150–400)
Platelets: 228 10*3/uL (ref 150–400)
RBC: 5.45 MIL/uL (ref 4.22–5.81)
RBC: 4.41 MIL/uL (ref 4.22–5.81)
RDW: 12.2 % (ref 11.5–15.5)
RDW: 12.3 % (ref 11.5–15.5)
WBC: 11.4 10*3/uL — ABNORMAL HIGH (ref 4.0–10.5)
WBC: 10.2 10*3/uL (ref 4.0–10.5)
nRBC: 0 % (ref 0.0–0.2)
nRBC: 0 % (ref 0.0–0.2)

## 2017-11-20 LAB — URINALYSIS, ROUTINE W REFLEX MICROSCOPIC
BILIRUBIN URINE: NEGATIVE
Glucose, UA: NEGATIVE mg/dL
Ketones, ur: NEGATIVE mg/dL
Leukocytes, UA: NEGATIVE
NITRITE: NEGATIVE
PH: 5 (ref 5.0–8.0)
Protein, ur: 100 mg/dL — AB
SPECIFIC GRAVITY, URINE: 1.01 (ref 1.005–1.030)

## 2017-11-20 LAB — BLOOD GAS, VENOUS
ACID-BASE DEFICIT: 8.4 mmol/L — AB (ref 0.0–2.0)
Acid-base deficit: 5.7 mmol/L — ABNORMAL HIGH (ref 0.0–2.0)
Bicarbonate: 17.8 mmol/L — ABNORMAL LOW (ref 20.0–28.0)
Bicarbonate: 19.8 mmol/L — ABNORMAL LOW (ref 20.0–28.0)
DRAWN BY: 105551
DRAWN BY: 1614
FIO2: 21
O2 Saturation: 80.6 %
PATIENT TEMPERATURE: 37
PCO2 VEN: 31.2 mmHg — AB (ref 44.0–60.0)
PH VEN: 7.337 (ref 7.250–7.430)
PO2 VEN: 110 mmHg — AB (ref 32.0–45.0)
pCO2, Ven: 36.5 mmHg — ABNORMAL LOW (ref 44.0–60.0)
pH, Ven: 7.337 (ref 7.250–7.430)
pO2, Ven: 52.9 mmHg — ABNORMAL HIGH (ref 32.0–45.0)

## 2017-11-20 LAB — MRSA PCR SCREENING: MRSA by PCR: NEGATIVE

## 2017-11-20 LAB — BASIC METABOLIC PANEL
Anion gap: >20 — ABNORMAL HIGH (ref 5–15)
BUN: 13 mg/dL (ref 6–20)
CO2: 8 mmol/L — ABNORMAL LOW (ref 22–32)
CREATININE: 1.71 mg/dL — AB (ref 0.61–1.24)
Calcium: 10 mg/dL (ref 8.9–10.3)
Chloride: 102 mmol/L (ref 98–111)
GFR calc Af Amer: 55 mL/min — ABNORMAL LOW (ref >60)
GFR calc non Af Amer: 48 mL/min — ABNORMAL LOW (ref >60)
GLUCOSE: 143 mg/dL — AB (ref 70–99)
POTASSIUM: 3.4 mmol/L — AB (ref 3.5–5.1)
SODIUM: 143 mmol/L (ref 135–145)

## 2017-11-20 LAB — COMPREHENSIVE METABOLIC PANEL
ALBUMIN: 3.6 g/dL (ref 3.5–5.0)
ALT: 24 U/L (ref 0–44)
ANION GAP: 10 (ref 5–15)
AST: 35 U/L (ref 15–41)
Alkaline Phosphatase: 49 U/L (ref 38–126)
BILIRUBIN TOTAL: 0.9 mg/dL (ref 0.3–1.2)
BUN: 10 mg/dL (ref 6–20)
CALCIUM: 7.3 mg/dL — AB (ref 8.9–10.3)
CO2: 19 mmol/L — ABNORMAL LOW (ref 22–32)
CREATININE: 1.04 mg/dL (ref 0.61–1.24)
Chloride: 110 mmol/L (ref 98–111)
Glucose, Bld: 85 mg/dL (ref 70–99)
POTASSIUM: 4 mmol/L (ref 3.5–5.1)
Sodium: 139 mmol/L (ref 135–145)
Total Protein: 6.3 g/dL — ABNORMAL LOW (ref 6.5–8.1)

## 2017-11-20 LAB — HEPATIC FUNCTION PANEL
ALBUMIN: 5.2 g/dL — AB (ref 3.5–5.0)
ALK PHOS: 90 U/L (ref 38–126)
ALT: 30 U/L (ref 0–44)
AST: 52 U/L — ABNORMAL HIGH (ref 15–41)
BILIRUBIN INDIRECT: 0.7 mg/dL (ref 0.3–0.9)
Bilirubin, Direct: 0.1 mg/dL (ref 0.0–0.2)
TOTAL PROTEIN: 9.5 g/dL — AB (ref 6.5–8.1)
Total Bilirubin: 0.8 mg/dL (ref 0.3–1.2)

## 2017-11-20 LAB — RAPID URINE DRUG SCREEN, HOSP PERFORMED
Amphetamines: NOT DETECTED
Barbiturates: NOT DETECTED
Benzodiazepines: NOT DETECTED
Cocaine: POSITIVE — AB
OPIATES: NOT DETECTED
TETRAHYDROCANNABINOL: NOT DETECTED

## 2017-11-20 LAB — LACTIC ACID, PLASMA
LACTIC ACID, VENOUS: 5.4 mmol/L — AB (ref 0.5–1.9)
Lactic Acid, Venous: 1.8 mmol/L (ref 0.5–1.9)

## 2017-11-20 LAB — CK
Total CK: 726 U/L — ABNORMAL HIGH (ref 49–397)
Total CK: 1534 U/L — ABNORMAL HIGH (ref 49–397)

## 2017-11-20 LAB — ACETAMINOPHEN LEVEL

## 2017-11-20 LAB — PHOSPHORUS
PHOSPHORUS: 8.4 mg/dL — AB (ref 2.5–4.6)
PHOSPHORUS: 4.1 mg/dL (ref 2.5–4.6)

## 2017-11-20 LAB — ETHANOL: ALCOHOL ETHYL (B): 188 mg/dL — AB (ref <10)

## 2017-11-20 LAB — SALICYLATE LEVEL

## 2017-11-20 LAB — MAGNESIUM: MAGNESIUM: 3.2 mg/dL — AB (ref 1.7–2.4)

## 2017-11-20 MED ORDER — THIAMINE HCL 100 MG/ML IJ SOLN
INTRAMUSCULAR | Status: AC
  Filled 2017-11-20: qty 6

## 2017-11-20 MED ORDER — POTASSIUM CHLORIDE IN NACL 20-0.9 MEQ/L-% IV SOLN
INTRAVENOUS | Status: DC
  Administered 2017-11-20 – 2017-11-22 (×9): via INTRAVENOUS

## 2017-11-20 MED ORDER — SODIUM CHLORIDE 0.9 % IV BOLUS
1000.0000 mL | Freq: Once | INTRAVENOUS | Status: AC
  Administered 2017-11-20: 1000 mL via INTRAVENOUS

## 2017-11-20 MED ORDER — ADULT MULTIVITAMIN W/MINERALS CH
1.0000 | ORAL_TABLET | Freq: Every day | ORAL | Status: DC
  Administered 2017-11-20 – 2017-11-23 (×4): 1 via ORAL
  Filled 2017-11-20 (×4): qty 1

## 2017-11-20 MED ORDER — ONDANSETRON HCL 4 MG/2ML IJ SOLN: 4.0000 mg | Freq: Four times a day (QID) | INTRAMUSCULAR | Status: DC | PRN

## 2017-11-20 MED ORDER — THIAMINE HCL 100 MG/ML IJ SOLN
500.0000 mg | Freq: Once | INTRAVENOUS | Status: AC
  Administered 2017-11-20: 500 mg via INTRAVENOUS
  Filled 2017-11-20: qty 5

## 2017-11-20 MED ORDER — LORAZEPAM 2 MG/ML IJ SOLN
0.0000 mg | INTRAMUSCULAR | Status: AC
  Administered 2017-11-20: 2 mg via INTRAVENOUS
  Administered 2017-11-20: 4 mg via INTRAVENOUS
  Administered 2017-11-20 – 2017-11-21 (×5): 2 mg via INTRAVENOUS
  Filled 2017-11-20 (×5): qty 1
  Filled 2017-11-20: qty 2
  Filled 2017-11-20: qty 1

## 2017-11-20 MED ORDER — VITAMIN B-1 100 MG PO TABS
100.0000 mg | ORAL_TABLET | Freq: Every day | ORAL | Status: DC
  Administered 2017-11-20 – 2017-11-23 (×4): 100 mg via ORAL
  Filled 2017-11-20 (×4): qty 1

## 2017-11-20 MED ORDER — POTASSIUM CHLORIDE IN NACL 20-0.9 MEQ/L-% IV SOLN
INTRAVENOUS | Status: DC
  Administered 2017-11-20: 05:00:00 via INTRAVENOUS

## 2017-11-20 MED ORDER — INFLUENZA VAC SPLIT QUAD 0.5 ML IM SUSY
0.5000 mL | PREFILLED_SYRINGE | INTRAMUSCULAR | Status: AC
  Administered 2017-11-21: 0.5 mL via INTRAMUSCULAR
  Filled 2017-11-20: qty 0.5

## 2017-11-20 MED ORDER — THIAMINE HCL 100 MG/ML IJ SOLN: 100.0000 mg | Freq: Every day | INTRAMUSCULAR | Status: DC

## 2017-11-20 MED ORDER — HEPARIN SODIUM (PORCINE) 5000 UNIT/ML IJ SOLN
5000.0000 [IU] | Freq: Three times a day (TID) | INTRAMUSCULAR | Status: DC
  Administered 2017-11-20: 5000 [IU] via SUBCUTANEOUS
  Filled 2017-11-20: qty 1

## 2017-11-20 MED ORDER — LORAZEPAM 2 MG/ML IJ SOLN: 1.0000 mg | INTRAMUSCULAR | Status: DC | PRN

## 2017-11-20 MED ORDER — FOLIC ACID 1 MG PO TABS
1.0000 mg | ORAL_TABLET | Freq: Every day | ORAL | Status: DC
  Administered 2017-11-20 – 2017-11-23 (×4): 1 mg via ORAL
  Filled 2017-11-20 (×4): qty 1

## 2017-11-20 MED ORDER — MAGNESIUM SULFATE 2 GM/50ML IV SOLN: 2.0000 g | Freq: Once | INTRAVENOUS | Status: DC

## 2017-11-20 MED ORDER — ONDANSETRON HCL 4 MG PO TABS: 4.0000 mg | ORAL_TABLET | Freq: Four times a day (QID) | ORAL | Status: DC | PRN

## 2017-11-20 MED ORDER — LORAZEPAM 2 MG/ML IJ SOLN: 0.0000 mg | Freq: Two times a day (BID) | INTRAMUSCULAR | Status: DC

## 2017-11-20 MED ORDER — LORAZEPAM 2 MG/ML IJ SOLN
2.0000 mg | INTRAMUSCULAR | Status: DC | PRN
  Administered 2017-11-20: 3 mg via INTRAVENOUS
  Filled 2017-11-20 (×2): qty 1

## 2017-11-20 MED ORDER — LORAZEPAM 1 MG PO TABS: 1.0000 mg | ORAL_TABLET | ORAL | Status: DC | PRN

## 2017-11-20 MED ORDER — DEXTROSE-NACL 5-0.9 % IV SOLN
INTRAVENOUS | Status: DC
  Administered 2017-11-20: 05:00:00 via INTRAVENOUS

## 2017-11-20 NOTE — Consult Note (Signed)
  Patient seen and chart is reviewed. The patient admits to daily alcohol use but denies other psychiatric history. Currently denies any suicidal/homicidal ideation. He is currently undecided about seeking help for his alcohol abuse. On admission to [facility] alcohol level was 188 and urine was positive for cocaine. Patient is pleasant and cooperative during the assessment today. He does not meet criteria for inpatient psychiatric treatment at this time. Paul [facility] will be provided with resources should he decide to seek help due to current pattern of alcohol use, which he admits is daily. Case discussed with TTS team. Patient may be discharged with resources when medically cleared.

## 2017-11-20 NOTE — H&P (Signed)
History and Physical    Paul [facility]:[id number] DOB: [birth year] DOA: 11/20/2017  PCP: Patient, No Pcp Per   Patient coming from: Home.  I have personally briefly reviewed patient's old medical records in [facility]  Chief Complaint: Alcohol intoxication.  HPI: Paul [facility] is a 42 y.o. male with medical history significant of hypertension, seizure disorder, alcohol dependence who was brought to the emergency department due to EtOH intoxication.  The patient was not very cooperative downstairs and stated that he was being chased.  However, when I saw him in the ICU he just remember being outside of a convenience store and then waking up in the hospital.  He denies fever, chills, sore throat, dyspnea, wheezing, hemoptysis, chest pain, diaphoresis, PND, orthopnea or pitting edema of the lower extremities.  No abdominal pain, nausea, emesis, diarrhea, constipation, melena or hematochezia.  Denies dysuria, frequency or hematuria.  Denies polyuria, polydipsia, polyphagia or blurred vision.  No skin pruritus.  ED Course: Initial vital signs pulse 138, respirations 20, blood pressure 141/79 mmHg and O2 sat 95% on room air.  His CBC showed a white count was 11.4, hemoglobin 17.2 g/dL and platelets 811.  Sodium 143, potassium 3.4, chloride 102 and CO2 18 mmol/L.  BUN was 13, creatinine 1.71, calcium 10.0, glucose 143, alcohol 188, magnesium 3.2 and phosphorus 8.4 mg/dL.  LFTs show a total protein of 9.5 and albumin of 5.2 g/dL.  AST was mildly elevated at 52.  His chest radiograph did not show any active cardiopulmonary disease.  Review of Systems: As per HPI otherwise 10 point review of systems negative  Past Medical History:  Diagnosis Date  . Hypertension   . Seizures (HCC)     Past Surgical History:  Procedure Laterality Date  . FACIAL COSMETIC SURGERY       reports that he has been smoking cigarettes. He has been smoking about 0.50 packs per day. He has never used smokeless  tobacco. He reports that he drinks about 1.0 - 2.0 standard drinks of alcohol per week. He reports that he does not use drugs.  Allergies  Allergen Reactions  . Onion Anaphylaxis    OTHER: lettuce.   Family History  Problem Relation Age of Onset  . Hypertension Mother   . Hypertension Father     Prior to Admission medications   Not on File    Physical Exam: Vitals:   11/20/17 0245 11/20/17 0300 11/20/17 0330 11/20/17 0345  BP:  (!) 116/50 129/78   Pulse: (!) 130 (!) 117 (!) 109 (!) 111  Resp: 20 18 18 14   SpO2: 92% 91% (!) 87% 90%    Constitutional: NAD, calm, comfortable Eyes: PERRL, lids and conjunctivae normal ENMT: Mucous membranes are very dry. Posterior pharynx clear of any exudate or lesions. Neck: normal, supple, no masses, no thyromegaly Respiratory: clear to auscultation bilaterally, no wheezing, no crackles. Normal respiratory effort. No accessory muscle use.  Cardiovascular: Tachycardic at 112 bpm, no murmurs / rubs / gallops. No extremity edema. 2+ pedal pulses. No carotid bruits.  Abdomen: Soft, no tenderness, no masses palpated. No hepatosplenomegaly. Bowel sounds positive.  Musculoskeletal: no clubbing / cyanosis. Good ROM, no contractures. Normal muscle tone.  Skin: Decreased skin turgor. Neurologic: CN 2-12 grossly intact. Sensation intact, DTR normal. Strength 5/5 in all 4.  Psychiatric:  Alert and oriented x 3.    Labs on Admission: I have personally reviewed following labs and imaging studies  CBC: Recent Labs  Lab 11/20/17 0131  WBC 11.4*  HGB 17.2*  HCT 58.0*  MCV 106.4*  PLT 228   Basic Metabolic Panel: Recent Labs  Lab 11/20/17 0131  NA 143  K 3.4*  CL 102  CO2 8*  GLUCOSE 143*  BUN 13  CREATININE 1.71*  CALCIUM 10.0  MG 3.2*  PHOS 8.4*   GFR: CrCl cannot be calculated (Unknown ideal weight.). Liver Function Tests: Recent Labs  Lab 11/20/17 0131  AST 52*  ALT 30  ALKPHOS 90  BILITOT 0.8  PROT 9.5*  ALBUMIN 5.2*    No results for input(s): LIPASE, AMYLASE in the last 168 hours. No results for input(s): AMMONIA in the last 168 hours. Coagulation Profile: No results for input(s): INR, PROTIME in the last 168 hours. Cardiac Enzymes: Recent Labs  Lab 11/20/17 0131  CKTOTAL 726*   BNP (last 3 results) No results for input(s): PROBNP in the last 8760 hours. HbA1C: No results for input(s): HGBA1C in the last 72 hours. CBG: No results for input(s): GLUCAP in the last 168 hours. Lipid Profile: No results for input(s): CHOL, HDL, LDLCALC, TRIG, CHOLHDL, LDLDIRECT in the last 72 hours. Thyroid Function Tests: No results for input(s): TSH, T4TOTAL, FREET4, T3FREE, THYROIDAB in the last 72 hours. Anemia Panel: No results for input(s): VITAMINB12, FOLATE, FERRITIN, TIBC, IRON, RETICCTPCT in the last 72 hours. Urine analysis:    Component Value Date/Time   COLORURINE STRAW (A) 11/20/2017 0350   APPEARANCEUR HAZY (A) 11/20/2017 0350   LABSPEC 1.010 11/20/2017 0350   PHURINE 5.0 11/20/2017 0350   GLUCOSEU NEGATIVE 11/20/2017 0350   HGBUR SMALL (A) 11/20/2017 0350   BILIRUBINUR NEGATIVE 11/20/2017 0350   KETONESUR NEGATIVE 11/20/2017 0350   PROTEINUR 100 (A) 11/20/2017 0350   UROBILINOGEN 0.2 11/14/2011 0935   NITRITE NEGATIVE 11/20/2017 0350   LEUKOCYTESUR NEGATIVE 11/20/2017 0350    Radiological Exams on Admission: Dg Chest Port 1 View  Result Date: 11/20/2017 CLINICAL DATA:  Shortness of breath, cough and tachycardia. EXAM: PORTABLE CHEST 1 VIEW COMPARISON:  07/14/2017 FINDINGS: The heart size and mediastinal contours are within normal limits. Both lungs are clear. The visualized skeletal structures are unremarkable. IMPRESSION: No active disease. Electronically Signed   By: Deatra Robinson M.D.   On: 11/20/2017 02:21    EKG: Independently reviewed.  Vent. rate 145 BPM PR interval * ms QRS duration 102 ms QT/QTc 375/583 ms P-R-T axes 101 84 63 Sinus tachycardia Minimal ST depression,  inferior leads Prolonged QT interval  Assessment/Plan Principal Problem:   AKI (acute kidney injury) (HCC) Observation/stepdown. Continue IV hydration. Follow-up renal function, electrolytes and total CK.    Alcoholic ketoacidosis Continue IV hydration. Monitor CBG. Follow-up renal function, anion gap and electrolytes. Follow-up lactic acid and VBG.  Active Problems:   Alcohol intoxication with delirium (HCC)   Alcohol dependence (HCC) The patient does not remember much of what happened. He said he was outside of a convenience store and then waking up in the ED. However, he told EMS and the nursing staff that he was being chased. Continue IV hydration and electrolyte replacement. CiWA protocol    Hypertension No longer on antihypertensives. Monitor blood pressure. Avoid beta-blockers.    Polycythemia This is secondary to dehydration.    Rhabdomyolysis Continue IV hydration. Follow-up total CK level.    Seizures (HCC) Apparently related to alcohol. Not on medication.    Hypokalemia Replacing. Follow-up level.    Cocaine use The patient denies using cocaine. He states that it may have been put in his  beer. Avoid beta-blocker use. Continue IV fluids.   DVT prophylaxis: SCDs.. Code Status: Full code. Family Communication:  Disposition Plan: Admit for IV hydration and alcoholic ketosis treatment. Consults called:  Admission status: Observation/stepdown.   Bobette Mo MD [facility] Pager [phone]  If 7PM-7AM, please contact night-coverage [email or web address] Password [email or web address]  11/20/2017, 4:57 AM
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
## 2017-11-20 NOTE — Progress Notes (Signed)
11/20/2017  1:13 AM  11/20/2017 10:30 AM  Paul Lambert was seen and examined.  The H&P by the admitting provider, orders, imaging was reviewed.  Please see new orders.  Will continue to follow.  The patient says that he wants to go home because it is his birthday.  I told him that I could not safely discharge him today.  He verbalized understanding.  I explained the importance of him staying in hospital to continue to receive IV hydration.  His CK level is trending higher and he has an elevated lactic acid.  He verbalized understanding.   I consulted the TTS service to see if he would need inpatient psych services but they say No.  I worry that patient will try to leave hospital AMA later today.    Maryln Manuel, MD [facility]

## 2017-11-20 NOTE — Progress Notes (Signed)
CRITICAL VALUE ALERT  Critical Value: lactic acid 5.4  Date & Time Notied: 10-14 0542  Provider Notified: Robb Matar  Orders Received/Actions taken: no new orders at this time
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
## 2017-11-21 DIAGNOSIS — D751 Secondary polycythemia: Secondary | ICD-10-CM

## 2017-11-21 DIAGNOSIS — R569 Unspecified convulsions: Secondary | ICD-10-CM

## 2017-11-21 LAB — COMPREHENSIVE METABOLIC PANEL
ALK PHOS: 50 U/L (ref 38–126)
ALT: 24 U/L (ref 0–44)
ANION GAP: 4 — AB (ref 5–15)
AST: 35 U/L (ref 15–41)
Albumin: 3.4 g/dL — ABNORMAL LOW (ref 3.5–5.0)
BUN: 7 mg/dL (ref 6–20)
CALCIUM: 7.8 mg/dL — AB (ref 8.9–10.3)
CO2: 23 mmol/L (ref 22–32)
CREATININE: 0.84 mg/dL (ref 0.61–1.24)
Chloride: 110 mmol/L (ref 98–111)
GFR calc Af Amer: >60 mL/min (ref >60)
Glucose, Bld: 155 mg/dL — ABNORMAL HIGH (ref 70–99)
Potassium: 3.8 mmol/L (ref 3.5–5.1)
Sodium: 137 mmol/L (ref 135–145)
Total Bilirubin: 0.8 mg/dL (ref 0.3–1.2)
Total Protein: 6.2 g/dL — ABNORMAL LOW (ref 6.5–8.1)

## 2017-11-21 LAB — CK: Total CK: 1459 U/L — ABNORMAL HIGH (ref 49–397)

## 2017-11-21 LAB — CBC WITH DIFFERENTIAL/PLATELET
ABS IMMATURE GRANULOCYTES: 0.01 10*3/uL (ref 0.00–0.07)
Basophils Absolute: 0 10*3/uL (ref 0.0–0.1)
Basophils Relative: 1 %
EOS PCT: 3 %
Eosinophils Absolute: 0.2 10*3/uL (ref 0.0–0.5)
HCT: 42.5 % (ref 39.0–52.0)
Hemoglobin: 14.2 g/dL (ref 13.0–17.0)
Immature Granulocytes: 0 %
Lymphocytes Relative: 27 %
Lymphs Abs: 1.7 10*3/uL (ref 0.7–4.0)
MCH: 31.3 pg (ref 26.0–34.0)
MCHC: 33.4 g/dL (ref 30.0–36.0)
MCV: 93.8 fL (ref 80.0–100.0)
MONO ABS: 0.5 10*3/uL (ref 0.1–1.0)
MONOS PCT: 7 %
NEUTROS ABS: 3.9 10*3/uL (ref 1.7–7.7)
Neutrophils Relative %: 62 %
PLATELETS: 210 10*3/uL (ref 150–400)
RBC: 4.53 MIL/uL (ref 4.22–5.81)
RDW: 12.3 % (ref 11.5–15.5)
WBC: 6.4 10*3/uL (ref 4.0–10.5)
nRBC: 0 % (ref 0.0–0.2)

## 2017-11-21 LAB — MAGNESIUM: MAGNESIUM: 2.2 mg/dL (ref 1.7–2.4)

## 2017-11-21 LAB — HIV ANTIBODY (ROUTINE TESTING W REFLEX): HIV SCREEN 4TH GENERATION: NONREACTIVE

## 2017-11-21 NOTE — Progress Notes (Signed)
PROGRESS NOTE  Paul Lambert  ZOX:[id number]  DOB: [birth year]  DOA: 11/20/2017 PCP: Patient, No Pcp Per  Brief Admission Hx: Paul Lambert is a 42 y.o. male with medical history significant of hypertension, seizure disorder, alcohol dependence who was brought to the emergency department due to EtOH intoxication.   MDM/Assessment & Plan:   1. Alcoholic ketoacidosis- he is responding well to IV hydration.  We will continue current treatment plan.  He also has improving with his mental status.  I still think that he is clinically dehydrated and needs more IV fluid hydration.  He has agreed to remain in the hospital for another day for further IV fluids. 2. Alcohol intoxication with delirium-his delirium has improved however he remains on these CIWA protocol and he is receiving regular lorazepam injections.  Continue supportive therapy with IV fluids.  Continue vitamin supplementation as ordered. 3. Rhabdomyolysis- CK is slowly trending down continue IV fluid hydration and follow CK level. 4. Essential hypertension- avoiding beta blockers due to recent cocaine use, monitoring blood pressures, the patient says he has no longer been taking his home antihypertensives. 5. Polycythemia-likely secondary to dehydration-improving with IV fluid hydration.  Monitoring. 6. Acute kidney injury- resolved with IV fluid hydration.  DVT prophylaxis: Lovenox Code Status: Full Family Communication: Patient updated fully at bedside Disposition Plan: Home tomorrow   Subjective: Patient says that he feels much better today.  He is oriented and aware of the situation he does not fully remember the events leading to this hospitalization.  Objective: Vitals:   11/21/17 0300 11/21/17 0400 11/21/17 0500 11/21/17 0600  BP: 126/89   (!) 125/91  Pulse: 81   84  Resp: (!) 22   16  Temp:  98.9 F (37.2 C)    TempSrc:  Oral    SpO2: 98%   98%  Weight:   86.2 kg   Height:        Intake/Output Summary (Last 24  hours) at 11/21/2017 0738 Last data filed at 11/21/2017 0500 Gross per 24 hour  Intake 3666.38 ml  Output 4900 ml  Net -1233.62 ml   Filed Weights   11/20/17 0500 11/21/17 0500  Weight: 84.7 kg 86.2 kg    REVIEW OF SYSTEMS  As per history otherwise all reviewed and reported negative  Exam:  General exam: Poorly groomed male, awake, alert, cooperative, no apparent distress. Respiratory system: Clear. No increased work of breathing. Cardiovascular system: S1 & S2 heard, RRR. No JVD, murmurs, gallops, clicks or pedal edema. Gastrointestinal system: Abdomen is nondistended, soft and nontender. Normal bowel sounds heard. Central nervous system: Alert and oriented. No focal neurological deficits. Extremities: no CCE.  Data Reviewed: Basic Metabolic Panel: Recent Labs  Lab 11/20/17 0131 11/20/17 0845 11/21/17 0358  NA 143 139 137  K 3.4* 4.0 3.8  CL 102 110 110  CO2 8* 19* 23  GLUCOSE 143* 85 155*  BUN 13 10 7   CREATININE 1.71* 1.04 0.84  CALCIUM 10.0 7.3* 7.8*  MG 3.2*  --  2.2  PHOS 8.4* 4.1  --    Liver Function Tests: Recent Labs  Lab 11/20/17 0131 11/20/17 0845 11/21/17 0358  AST 52* 35 35  ALT 30 24 24   ALKPHOS 90 49 50  BILITOT 0.8 0.9 0.8  PROT 9.5* 6.3* 6.2*  ALBUMIN 5.2* 3.6 3.4*   No results for input(s): LIPASE, AMYLASE in the last 168 hours. No results for input(s): AMMONIA in the last 168 hours. CBC: Recent Labs  Lab 11/20/17  0131 11/20/17 0845 11/21/17 0358  WBC 11.4* 10.2 6.4  NEUTROABS  --   --  3.9  HGB 17.2* 13.6 14.2  HCT 58.0* 40.8 42.5  MCV 106.4* 92.5 93.8  PLT 228 188 210   Cardiac Enzymes: Recent Labs  Lab 11/20/17 0131 11/20/17 0845 11/21/17 0358  CKTOTAL 726* 1,534* 1,459*   CBG (last 3)  No results for input(s): GLUCAP in the last 72 hours. Recent Results (from the past 240 hour(s))  MRSA PCR Screening     Status: None   Collection Time: 11/20/17  5:00 PM  Result Value Ref Range Status   MRSA by PCR NEGATIVE  NEGATIVE Final    Comment:        The GeneXpert MRSA Assay (FDA approved for NASAL specimens only), is one component of a comprehensive MRSA colonization surveillance program. It is not intended to diagnose MRSA infection nor to guide or monitor treatment for MRSA infections. Performed at [facility], [address] [place], [place] [address]      Studies: Dg Chest [facility] 1 View  Result Date: 11/20/2017 CLINICAL DATA:  Shortness of breath, cough and tachycardia. EXAM: PORTABLE CHEST 1 VIEW COMPARISON:  07/14/2017 FINDINGS: The heart size and mediastinal contours are within normal limits. Both lungs are clear. The visualized skeletal structures are unremarkable. IMPRESSION: No active disease. Electronically Signed   By: Deatra Robinson M.D.   On: 11/20/2017 02:21   Scheduled Meds: . folic acid  1 mg Oral Daily  . Influenza vac split quadrivalent PF  0.5 mL Intramuscular Tomorrow-1000  . LORazepam  0-4 mg Intravenous Q4H   Followed by  . [START ON 11/22/2017] LORazepam  0-4 mg Intravenous Q12H  . multivitamin with minerals  1 tablet Oral Daily  . thiamine  100 mg Oral Daily   Or  . thiamine  100 mg Intravenous Daily   Continuous Infusions: . 0.9 % NaCl with KCl 20 mEq / L 185 mL/hr at 11/21/17 1914    Principal Problem:   AKI (acute kidney injury) (HCC) Active Problems:   Hypertension   Polycythemia   Alcohol intoxication with delirium (HCC)   Rhabdomyolysis   Seizures (HCC)   Hypokalemia   Alcohol dependence (HCC)   Cocaine use   Alcoholic ketoacidosis  Critical Care Time spent: 32-minute  Standley Dakins, MD, FAAFP [facility] Pager [phone]  If 7PM-7AM, please contact night-coverage [email or web address] Password [email or web address] 11/21/2017, 7:38 AM    LOS: 0 days

## 2017-11-22 DIAGNOSIS — F10921 Alcohol use, unspecified with intoxication delirium: Secondary | ICD-10-CM

## 2017-11-22 LAB — CBC WITH DIFFERENTIAL/PLATELET
Abs Immature Granulocytes: 0.02 10*3/uL (ref 0.00–0.07)
Basophils Absolute: 0 10*3/uL (ref 0.0–0.1)
Basophils Relative: 1 %
EOS ABS: 0.2 10*3/uL (ref 0.0–0.5)
Eosinophils Relative: 4 %
HCT: 43.7 % (ref 39.0–52.0)
Hemoglobin: 14.7 g/dL (ref 13.0–17.0)
Immature Granulocytes: 0 %
Lymphocytes Relative: 36 %
Lymphs Abs: 1.9 10*3/uL (ref 0.7–4.0)
MCH: 31.5 pg (ref 26.0–34.0)
MCHC: 33.6 g/dL (ref 30.0–36.0)
MCV: 93.6 fL (ref 80.0–100.0)
MONO ABS: 0.5 10*3/uL (ref 0.1–1.0)
MONOS PCT: 9 %
Neutro Abs: 2.6 10*3/uL (ref 1.7–7.7)
Neutrophils Relative %: 50 %
Platelets: 226 10*3/uL (ref 150–400)
RBC: 4.67 MIL/uL (ref 4.22–5.81)
RDW: 12.1 % (ref 11.5–15.5)
WBC: 5.3 10*3/uL (ref 4.0–10.5)
nRBC: 0 % (ref 0.0–0.2)

## 2017-11-22 LAB — BASIC METABOLIC PANEL
Anion gap: 7 (ref 5–15)
BUN: 5 mg/dL — AB (ref 6–20)
CHLORIDE: 107 mmol/L (ref 98–111)
CO2: 23 mmol/L (ref 22–32)
CREATININE: 0.84 mg/dL (ref 0.61–1.24)
Calcium: 8.9 mg/dL (ref 8.9–10.3)
GFR calc Af Amer: >60 mL/min (ref >60)
GFR calc non Af Amer: >60 mL/min (ref >60)
Glucose, Bld: 108 mg/dL — ABNORMAL HIGH (ref 70–99)
Potassium: 4 mmol/L (ref 3.5–5.1)
SODIUM: 137 mmol/L (ref 135–145)

## 2017-11-22 LAB — CK: Total CK: 2950 U/L — ABNORMAL HIGH (ref 49–397)

## 2017-11-22 MED ORDER — HYDRALAZINE HCL 20 MG/ML IJ SOLN
10.0000 mg | Freq: Once | INTRAMUSCULAR | Status: AC
  Administered 2017-11-22: 10 mg via INTRAVENOUS
  Filled 2017-11-22: qty 1

## 2017-11-22 MED ORDER — SODIUM CHLORIDE 0.9 % IV SOLN
INTRAVENOUS | Status: DC
  Administered 2017-11-22 – 2017-11-23 (×6): via INTRAVENOUS

## 2017-11-22 NOTE — Progress Notes (Signed)
Paul Lambert  ZOX:[id number]  DOB: [birth year]  DOA: 11/20/2017 PCP: Patient, No Pcp Per  Brief Admission Hx: Paul Lambert is a 42 y.o. male with medical history significant of hypertension, seizure disorder, alcohol dependence who was brought to the emergency department due to EtOH intoxication.   MDM/Assessment & Plan:   1. Alcoholic ketoacidosis- resolved now.  He is responding well to IV hydration.  We will continue current treatment plan.  He also has improving with his mental status.  I still think that he is clinically dehydrated and needs more IV fluid hydration especially with his rising CK levels.  2. Alcohol intoxication with delirium-his delirium has improved however he remains on these CIWA protocol and he is receiving lorazepam.  Continue supportive therapy with IV fluids.  Continue vitamin supplementation as ordered. 3. Rhabdomyolysis- CK is rising, increase rate of IV fluids and follow CK level. 4. Essential hypertension- avoiding beta blockers due to recent cocaine use, monitoring blood pressures, the patient says he has no longer been taking his home antihypertensives. 5. Polycythemia-likely secondary to dehydration-improving with IV fluid hydration.  Monitoring. 6. Acute kidney injury- resolved with IV fluid hydration.  DVT prophylaxis: Lovenox Code Status: Full Family Communication: Patient updated fully at bedside Disposition Plan: with rising CK levels patient needs ongoing IV fluids to treat rhabdomyolysis  Subjective: Patient says his muscles are aching, he is ambulating to the bathroom.  No other complaints.    Objective: Vitals:   11/21/17 1151 11/21/17 1627 11/21/17 2133 11/22/17 0549  BP:  (!) 155/103 (!) 147/99 133/78  Pulse:  71 67 68  Resp:  18    Temp: 99 F (37.2 C) 98.6 F (37 C) 98.2 F (36.8 C) 98.2 F (36.8 C)  TempSrc: Oral Oral Oral Oral  SpO2:  100% 99% 100%  Weight:      Height:        Intake/Output Summary (Last 24  hours) at 11/22/2017 1307 Last data filed at 11/22/2017 1000 Gross per 24 hour  Intake 5461.66 ml  Output 1400 ml  Net 4061.66 ml   Filed Weights   11/20/17 0500 11/21/17 0500  Weight: 84.7 kg 86.2 kg   REVIEW OF SYSTEMS  As per history otherwise all reviewed and reported negative  Exam:  General exam: Poorly groomed male, awake, alert, cooperative, no apparent distress. Respiratory system: Clear. No increased work of breathing. Cardiovascular system: S1 & S2 heard, RRR. No JVD, murmurs, gallops, clicks or pedal edema. Gastrointestinal system: Abdomen is nondistended, soft and nontender. Normal bowel sounds heard. Central nervous system: Alert and oriented. No focal neurological deficits. Extremities: no CCE.  Data Reviewed: Basic Metabolic Panel: Recent Labs  Lab 11/20/17 0131 11/20/17 0845 11/21/17 0358 11/22/17 0525  NA 143 139 137 137  K 3.4* 4.0 3.8 4.0  CL 102 110 110 107  CO2 8* 19* 23 23  GLUCOSE 143* 85 155* 108*  BUN 13 10 7  5*  CREATININE 1.71* 1.04 0.84 0.84  CALCIUM 10.0 7.3* 7.8* 8.9  MG 3.2*  --  2.2  --   PHOS 8.4* 4.1  --   --    Liver Function Tests: Recent Labs  Lab 11/20/17 0131 11/20/17 0845 11/21/17 0358  AST 52* 35 35  ALT 30 24 24   ALKPHOS 90 49 50  BILITOT 0.8 0.9 0.8  PROT 9.5* 6.3* 6.2*  ALBUMIN 5.2* 3.6 3.4*   No results for input(s): LIPASE, AMYLASE in the last 168 hours. No results for  input(s): AMMONIA in the last 168 hours. CBC: Recent Labs  Lab 11/20/17 0131 11/20/17 0845 11/21/17 0358 11/22/17 0525  WBC 11.4* 10.2 6.4 5.3  NEUTROABS  --   --  3.9 2.6  HGB 17.2* 13.6 14.2 14.7  HCT 58.0* 40.8 42.5 43.7  MCV 106.4* 92.5 93.8 93.6  PLT 228 188 210 226   Cardiac Enzymes: Recent Labs  Lab 11/20/17 0131 11/20/17 0845 11/21/17 0358 11/22/17 0525  CKTOTAL 726* 1,534* 1,459* 2,950*   CBG (last 3)  No results for input(s): GLUCAP in the last 72 hours. Recent Results (from the past 240 hour(s))  MRSA PCR  Screening     Status: None   Collection Time: 11/20/17  5:00 PM  Result Value Ref Range Status   MRSA by PCR NEGATIVE NEGATIVE Final    Comment:        The GeneXpert MRSA Assay (FDA approved for NASAL specimens only), is one component of a comprehensive MRSA colonization surveillance program. It is not intended to diagnose MRSA infection nor to guide or monitor treatment for MRSA infections. Performed at [facility], [address] [place], [place] [address]      Studies: No results found. Scheduled Meds: . folic acid  1 mg Oral Daily  . LORazepam  0-4 mg Intravenous Q12H  . multivitamin with minerals  1 tablet Oral Daily  . thiamine  100 mg Oral Daily   Or  . thiamine  100 mg Intravenous Daily   Continuous Infusions: . sodium chloride 200 mL/hr at 11/22/17 1216   Principal Problem:   AKI (acute kidney injury) (HCC) Active Problems:   Hypertension   Polycythemia   Alcohol intoxication with delirium (HCC)   Rhabdomyolysis   Seizures (HCC)   Hypokalemia   Alcohol dependence (HCC)   Cocaine use   Alcoholic ketoacidosis  Standley Dakins, MD, FAAFP [facility] Pager [phone]  If 7PM-7AM, please contact night-coverage [email or web address] Password [email or web address] 11/22/2017, 1:07 PM    LOS: 1 day

## 2017-11-22 NOTE — Care Management (Signed)
Pt uninsured and with no PCP. CM provided packet of county resources. Pt communicates no needs.

## 2017-11-23 DIAGNOSIS — F10229 Alcohol dependence with intoxication, unspecified: Secondary | ICD-10-CM

## 2017-11-23 DIAGNOSIS — F1490 Cocaine use, unspecified, uncomplicated: Secondary | ICD-10-CM

## 2017-11-23 DIAGNOSIS — E876 Hypokalemia: Secondary | ICD-10-CM

## 2017-11-23 DIAGNOSIS — I10 Essential (primary) hypertension: Secondary | ICD-10-CM

## 2017-11-23 DIAGNOSIS — E872 Acidosis: Secondary | ICD-10-CM

## 2017-11-23 DIAGNOSIS — N179 Acute kidney failure, unspecified: Principal | ICD-10-CM

## 2017-11-23 DIAGNOSIS — M6282 Rhabdomyolysis: Secondary | ICD-10-CM

## 2017-11-23 LAB — COMPREHENSIVE METABOLIC PANEL
ALT: 43 U/L (ref 0–44)
ANION GAP: 4 — AB (ref 5–15)
AST: 105 U/L — ABNORMAL HIGH (ref 15–41)
Albumin: 3.6 g/dL (ref 3.5–5.0)
Alkaline Phosphatase: 48 U/L (ref 38–126)
BUN: 6 mg/dL (ref 6–20)
CALCIUM: 8.7 mg/dL — AB (ref 8.9–10.3)
CHLORIDE: 107 mmol/L (ref 98–111)
CO2: 27 mmol/L (ref 22–32)
Creatinine, Ser: 0.91 mg/dL (ref 0.61–1.24)
GFR calc non Af Amer: >60 mL/min (ref >60)
Glucose, Bld: 104 mg/dL — ABNORMAL HIGH (ref 70–99)
Potassium: 3.6 mmol/L (ref 3.5–5.1)
SODIUM: 138 mmol/L (ref 135–145)
Total Bilirubin: 0.8 mg/dL (ref 0.3–1.2)
Total Protein: 6.8 g/dL (ref 6.5–8.1)

## 2017-11-23 LAB — CK: CK TOTAL: 8679 U/L — AB (ref 49–397)

## 2017-11-23 MED ORDER — LORAZEPAM 2 MG/ML IJ SOLN
1.0000 mg | Freq: Once | INTRAMUSCULAR | Status: AC
  Administered 2017-11-23: 1 mg via INTRAVENOUS
  Filled 2017-11-23: qty 1

## 2017-11-23 NOTE — Discharge Summary (Addendum)
Discharge Summary Pt decided to leave hospital against medical advice.  He says that he must attend a funeral today.  I explained that he has a rising CK level and he should remain for further hydration and he was being seen by the nephrologist.  He was adamant to leave.  I discussed the risks of leaving including death and he verbalized understanding.  The patient is free to return to hospital ER at any time.  He verbalized understanding.  Maryln Manuel MD    PROGRESS NOTE  Paul Lambert  ZOX:[id number]  DOB: [birth year]  DOA: 11/20/2017 PCP: Patient, No Pcp Per  Brief Admission Hx: Paul Lambert is a 42 y.o. male with medical history significant of hypertension, seizure disorder, alcohol dependence who was brought to the emergency department due to EtOH intoxication.   MDM/Assessment & Plan:   1. Alcoholic ketoacidosis- resolved now.  He is responding well to IV hydration.  We will continue current treatment plan.  He also has improving with his mental status.  I still think that he is clinically dehydrated and needs more IV fluid hydration especially with his rising CK levels.  2. Alcohol intoxication with delirium-his delirium has improved however he has been maintained on the CIWA protocol   Continue supportive therapy with IV fluids.  Continue vitamin supplementation as ordered. 3. Rhabdomyolysis- CK is rising, continue IV fluids and follow CK level.  Renal consult requested. 4. Essential hypertension- avoiding beta blockers due to recent cocaine use, monitoring blood pressures, the patient says he has no longer been taking his home antihypertensives. 5. Polycythemia-likely secondary to dehydration-improving with IV fluid hydration.  Monitoring. 6. Acute kidney injury- resolved with IV fluid hydration.  DVT prophylaxis: Lovenox Code Status: Full Family Communication: Patient updated fully at bedside Disposition Plan: with rising CK levels patient needs ongoing IV fluids to treat  rhabdomyolysis  Subjective: Patient says that he has been ambulating and muscle pains have been better today.      Objective: Vitals:   11/22/17 2328 11/22/17 2330 11/23/17 0020 11/23/17 0627  BP: (!) 154/106 (!) 149/101 (!) 150/106 (!) 139/99  Pulse:  81  70  Resp:      Temp:    98.4 F (36.9 C)  TempSrc:    Oral  SpO2:  99%  100%  Weight:      Height:        Intake/Output Summary (Last 24 hours) at 11/23/2017 1114 Last data filed at 11/23/2017 0900 Gross per 24 hour  Intake 2484.64 ml  Output 2175 ml  Net 309.64 ml   Filed Weights   11/20/17 0500 11/21/17 0500  Weight: 84.7 kg 86.2 kg   REVIEW OF SYSTEMS  As per history otherwise all reviewed and reported negative  Exam:  General exam: Poorly groomed male, awake, alert, cooperative, no apparent distress. Respiratory system: Clear. No increased work of breathing. Cardiovascular system: S1 & S2 heard, RRR. No JVD, murmurs, gallops, clicks or pedal edema. Gastrointestinal system: Abdomen is nondistended, soft and nontender. Normal bowel sounds heard. Central nervous system: Alert and oriented. No focal neurological deficits. Extremities: no CCE.  Data Reviewed: Basic Metabolic Panel: Recent Labs  Lab 11/20/17 0131 11/20/17 0845 11/21/17 0358 11/22/17 0525 11/23/17 0615  NA 143 139 137 137 138  K 3.4* 4.0 3.8 4.0 3.6  CL 102 110 110 107 107  CO2 8* 19* 23 23 27   GLUCOSE 143* 85 155* 108* 104*  BUN 13 10 7  5* 6  CREATININE  1.71* 1.04 0.84 0.84 0.91  CALCIUM 10.0 7.3* 7.8* 8.9 8.7*  MG 3.2*  --  2.2  --   --   PHOS 8.4* 4.1  --   --   --    Liver Function Tests: Recent Labs  Lab 11/20/17 0131 11/20/17 0845 11/21/17 0358 11/23/17 0615  AST 52* 35 35 105*  ALT 30 24 24  43  ALKPHOS 90 49 50 48  BILITOT 0.8 0.9 0.8 0.8  PROT 9.5* 6.3* 6.2* 6.8  ALBUMIN 5.2* 3.6 3.4* 3.6   No results for input(s): LIPASE, AMYLASE in the last 168 hours. No results for input(s): AMMONIA in the last 168  hours. CBC: Recent Labs  Lab 11/20/17 0131 11/20/17 0845 11/21/17 0358 11/22/17 0525  WBC 11.4* 10.2 6.4 5.3  NEUTROABS  --   --  3.9 2.6  HGB 17.2* 13.6 14.2 14.7  HCT 58.0* 40.8 42.5 43.7  MCV 106.4* 92.5 93.8 93.6  PLT 228 188 210 226   Cardiac Enzymes: Recent Labs  Lab 11/20/17 0131 11/20/17 0845 11/21/17 0358 11/22/17 0525 11/23/17 0615  CKTOTAL 726* 1,534* 1,459* 2,950* 8,679*   CBG (last 3)  No results for input(s): GLUCAP in the last 72 hours. Recent Results (from the past 240 hour(s))  MRSA PCR Screening     Status: None   Collection Time: 11/20/17  5:00 PM  Result Value Ref Range Status   MRSA by PCR NEGATIVE NEGATIVE Final    Comment:        The GeneXpert MRSA Assay (FDA approved for NASAL specimens only), is one component of a comprehensive MRSA colonization surveillance program. It is not intended to diagnose MRSA infection nor to guide or monitor treatment for MRSA infections. Performed at [facility], [address] [place], [place] [address]    Studies: No results found. Scheduled Meds: . folic acid  1 mg Oral Daily  . LORazepam  0-4 mg Intravenous Q12H  . multivitamin with minerals  1 tablet Oral Daily  . thiamine  100 mg Oral Daily   Or  . thiamine  100 mg Intravenous Daily   Continuous Infusions: . sodium chloride 200 mL/hr at 11/23/17 9811   Principal Problem:   AKI (acute kidney injury) (HCC) Active Problems:   Hypertension   Polycythemia   Alcohol intoxication with delirium (HCC)   Rhabdomyolysis   Seizures (HCC)   Hypokalemia   Alcohol dependence (HCC)   Cocaine use   Alcoholic ketoacidosis  Standley Dakins, MD, FAAFP [facility] Pager [phone]  If 7PM-7AM, please contact night-coverage [email or web address] Password [email or web address] 11/23/2017, 11:14 AM    LOS: 2 days

## 2017-11-23 NOTE — Progress Notes (Signed)
Patient requesting to leave AMA. MD made aware and states that pt is not medically ready for discharge. This RN discussed risk factors of leaving the hospital AMA with the patient and his mother, up to and including death. Patient still adamantly requesting to leave the hospital. IV removed, WNL. Signed AMA form has been placed on the patient's shadow chart.  Quita Skye, RN
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
## 2017-12-30 ENCOUNTER — Other Ambulatory Visit: Payer: Self-pay

## 2017-12-30 ENCOUNTER — Emergency Department (HOSPITAL_COMMUNITY)
Admission: EM | Admit: 2017-12-30 | Discharge: 2017-12-30 | Disposition: A | Discharge status: Home / Self Care | Payer: Self-pay | Attending: Emergency Medicine | Admitting: Emergency Medicine

## 2017-12-30 ENCOUNTER — Encounter (HOSPITAL_COMMUNITY): Payer: Self-pay | Admitting: Emergency Medicine

## 2017-12-30 DIAGNOSIS — Y929 Unspecified place or not applicable: Secondary | ICD-10-CM | POA: Insufficient documentation

## 2017-12-30 DIAGNOSIS — I10 Essential (primary) hypertension: Secondary | ICD-10-CM | POA: Insufficient documentation

## 2017-12-30 DIAGNOSIS — Y998 Other external cause status: Secondary | ICD-10-CM | POA: Insufficient documentation

## 2017-12-30 DIAGNOSIS — Y939 Activity, unspecified: Secondary | ICD-10-CM | POA: Insufficient documentation

## 2017-12-30 DIAGNOSIS — F17210 Nicotine dependence, cigarettes, uncomplicated: Secondary | ICD-10-CM | POA: Insufficient documentation

## 2017-12-30 DIAGNOSIS — S0181XA Laceration without foreign body of other part of head, initial encounter: Secondary | ICD-10-CM | POA: Insufficient documentation

## 2017-12-30 DIAGNOSIS — S0993XA Unspecified injury of face, initial encounter: Secondary | ICD-10-CM

## 2017-12-30 MED ORDER — LIDOCAINE-EPINEPHRINE (PF) 2 %-1:200000 IJ SOLN
10.0000 mL | Freq: Once | INTRAMUSCULAR | Status: DC
  Filled 2017-12-30: qty 10
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
## 2018-01-16 ENCOUNTER — Other Ambulatory Visit: Payer: Self-pay

## 2018-01-16 ENCOUNTER — Emergency Department (HOSPITAL_COMMUNITY)
Admission: EM | Admit: 2018-01-16 | Discharge: 2018-01-16 | Disposition: A | Discharge status: Home / Self Care | Payer: Self-pay | Attending: Emergency Medicine | Admitting: Emergency Medicine

## 2018-01-16 ENCOUNTER — Encounter (HOSPITAL_COMMUNITY): Payer: Self-pay | Admitting: Emergency Medicine

## 2018-01-16 DIAGNOSIS — Z4802 Encounter for removal of sutures: Secondary | ICD-10-CM | POA: Insufficient documentation
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
## 2018-01-16 NOTE — ED Triage Notes (Signed)
Suture removal left upper chin.

## 2018-01-16 NOTE — Discharge Instructions (Addendum)
Return as needed

## 2018-07-15 ENCOUNTER — Emergency Department (HOSPITAL_COMMUNITY)
Admission: EM | Admit: 2018-07-15 | Discharge: 2018-07-15 | Disposition: A | Discharge status: Home / Self Care | Payer: Self-pay | Attending: Emergency Medicine | Admitting: Emergency Medicine

## 2018-07-15 ENCOUNTER — Emergency Department (HOSPITAL_COMMUNITY): Payer: Self-pay

## 2018-07-15 ENCOUNTER — Other Ambulatory Visit: Payer: Self-pay

## 2018-07-15 ENCOUNTER — Encounter (HOSPITAL_COMMUNITY): Payer: Self-pay | Admitting: Emergency Medicine

## 2018-07-15 DIAGNOSIS — Y908 Blood alcohol level of 240 mg/100 ml or more: Secondary | ICD-10-CM | POA: Insufficient documentation

## 2018-07-15 DIAGNOSIS — R780 Finding of alcohol in blood: Secondary | ICD-10-CM | POA: Insufficient documentation

## 2018-07-15 DIAGNOSIS — F10929 Alcohol use, unspecified with intoxication, unspecified: Secondary | ICD-10-CM | POA: Insufficient documentation

## 2018-07-15 DIAGNOSIS — F17210 Nicotine dependence, cigarettes, uncomplicated: Secondary | ICD-10-CM | POA: Insufficient documentation

## 2018-07-15 DIAGNOSIS — I10 Essential (primary) hypertension: Secondary | ICD-10-CM | POA: Insufficient documentation

## 2018-07-15 LAB — CBC WITH DIFFERENTIAL/PLATELET
Abs Immature Granulocytes: 0.01 10*3/uL (ref 0.00–0.07)
Basophils Absolute: 0.1 10*3/uL (ref 0.0–0.1)
Basophils Relative: 1 %
Eosinophils Absolute: 0.1 10*3/uL (ref 0.0–0.5)
Eosinophils Relative: 3 %
HCT: 52.4 % — ABNORMAL HIGH (ref 39.0–52.0)
Hemoglobin: 17.5 g/dL — ABNORMAL HIGH (ref 13.0–17.0)
Immature Granulocytes: 0 %
Lymphocytes Relative: 44 %
Lymphs Abs: 2.4 10*3/uL (ref 0.7–4.0)
MCH: 31.1 pg (ref 26.0–34.0)
MCHC: 33.4 g/dL (ref 30.0–36.0)
MCV: 93.2 fL (ref 80.0–100.0)
Monocytes Absolute: 0.3 10*3/uL (ref 0.1–1.0)
Monocytes Relative: 5 %
Neutro Abs: 2.5 10*3/uL (ref 1.7–7.7)
Neutrophils Relative %: 47 %
Platelets: 309 10*3/uL (ref 150–400)
RBC: 5.62 MIL/uL (ref 4.22–5.81)
RDW: 12.4 % (ref 11.5–15.5)
WBC: 5.4 10*3/uL (ref 4.0–10.5)
nRBC: 0 % (ref 0.0–0.2)

## 2018-07-15 LAB — RAPID URINE DRUG SCREEN, HOSP PERFORMED
Amphetamines: NOT DETECTED
Barbiturates: NOT DETECTED
Benzodiazepines: NOT DETECTED
Cocaine: NOT DETECTED
Opiates: NOT DETECTED
Tetrahydrocannabinol: NOT DETECTED

## 2018-07-15 LAB — COMPREHENSIVE METABOLIC PANEL
ALT: 35 U/L (ref 0–44)
AST: 29 U/L (ref 15–41)
Albumin: 4.8 g/dL (ref 3.5–5.0)
Alkaline Phosphatase: 48 U/L (ref 38–126)
Anion gap: 14 (ref 5–15)
BUN: 6 mg/dL (ref 6–20)
CO2: 23 mmol/L (ref 22–32)
Calcium: 8.9 mg/dL (ref 8.9–10.3)
Chloride: 109 mmol/L (ref 98–111)
Creatinine, Ser: 1.03 mg/dL (ref 0.61–1.24)
GFR calc Af Amer: >60 mL/min (ref >60)
GFR calc non Af Amer: >60 mL/min (ref >60)
Glucose, Bld: 100 mg/dL — ABNORMAL HIGH (ref 70–99)
Potassium: 3.6 mmol/L (ref 3.5–5.1)
Sodium: 146 mmol/L — ABNORMAL HIGH (ref 135–145)
Total Bilirubin: 0.6 mg/dL (ref 0.3–1.2)
Total Protein: 8.2 g/dL — ABNORMAL HIGH (ref 6.5–8.1)

## 2018-07-15 LAB — ETHANOL: Alcohol, Ethyl (B): 312 mg/dL (ref <10)

## 2018-07-15 LAB — URINALYSIS, ROUTINE W REFLEX MICROSCOPIC
Bacteria, UA: NONE SEEN
Bilirubin Urine: NEGATIVE
Glucose, UA: NEGATIVE mg/dL
Ketones, ur: NEGATIVE mg/dL
Leukocytes,Ua: NEGATIVE
Nitrite: NEGATIVE
Protein, ur: NEGATIVE mg/dL
Specific Gravity, Urine: 1.003 — ABNORMAL LOW (ref 1.005–1.030)
pH: 5 (ref 5.0–8.0)

## 2018-07-15 LAB — CBG MONITORING, ED: Glucose-Capillary: 98 mg/dL (ref 70–99)

## 2018-07-15 LAB — SALICYLATE LEVEL: Salicylate Lvl: <7 mg/dL (ref 2.8–30.0)

## 2018-07-15 LAB — ACETAMINOPHEN LEVEL: Acetaminophen (Tylenol), Serum: <10 ug/mL — ABNORMAL LOW (ref 10–30)

## 2018-07-15 LAB — TROPONIN I: Troponin I: <0.03 ng/mL (ref <0.03)

## 2018-07-15 NOTE — ED Notes (Signed)
Date and time results received: 07/15/18 7:48 AM  (use smartphrase ".now" to insert current time)  Test: ETOH Critical Value: 312  Name of Provider Notified: Lacinda Axon  Orders Received? Or Actions Taken?: Orders Received - See Orders for details
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
## 2018-07-15 NOTE — Discharge Instructions (Signed)
Your alcohol was very high.  Recommend staying with your mother this afternoon.  Return for any problems.

## 2018-07-20 ENCOUNTER — Encounter (HOSPITAL_COMMUNITY): Payer: Self-pay

## 2018-07-20 ENCOUNTER — Emergency Department (HOSPITAL_COMMUNITY): Payer: Self-pay

## 2018-07-20 ENCOUNTER — Emergency Department (HOSPITAL_COMMUNITY)
Admission: EM | Admit: 2018-07-20 | Discharge: 2018-07-20 | Disposition: A | Discharge status: Home / Self Care | Payer: Self-pay | Attending: Emergency Medicine | Admitting: Emergency Medicine

## 2018-07-20 ENCOUNTER — Other Ambulatory Visit: Payer: Self-pay

## 2018-07-20 ENCOUNTER — Encounter (HOSPITAL_COMMUNITY): Payer: Self-pay | Admitting: Emergency Medicine

## 2018-07-20 DIAGNOSIS — Y92512 Supermarket, store or market as the place of occurrence of the external cause: Secondary | ICD-10-CM | POA: Insufficient documentation

## 2018-07-20 DIAGNOSIS — Z5321 Procedure and treatment not carried out due to patient leaving prior to being seen by health care provider: Secondary | ICD-10-CM | POA: Insufficient documentation

## 2018-07-20 DIAGNOSIS — Y939 Activity, unspecified: Secondary | ICD-10-CM | POA: Insufficient documentation

## 2018-07-20 DIAGNOSIS — I10 Essential (primary) hypertension: Secondary | ICD-10-CM | POA: Insufficient documentation

## 2018-07-20 DIAGNOSIS — R55 Syncope and collapse: Secondary | ICD-10-CM | POA: Insufficient documentation

## 2018-07-20 DIAGNOSIS — S0181XA Laceration without foreign body of other part of head, initial encounter: Secondary | ICD-10-CM

## 2018-07-20 DIAGNOSIS — Y999 Unspecified external cause status: Secondary | ICD-10-CM | POA: Insufficient documentation

## 2018-07-20 DIAGNOSIS — F17210 Nicotine dependence, cigarettes, uncomplicated: Secondary | ICD-10-CM | POA: Insufficient documentation

## 2018-07-20 DIAGNOSIS — S01112A Laceration without foreign body of left eyelid and periocular area, initial encounter: Secondary | ICD-10-CM | POA: Insufficient documentation

## 2018-07-20 MED ORDER — POVIDONE-IODINE 10 % EX SOLN
CUTANEOUS | Status: AC
  Filled 2018-07-20: qty 15

## 2018-07-20 MED ORDER — BACITRACIN ZINC 500 UNIT/GM EX OINT
TOPICAL_OINTMENT | CUTANEOUS | Status: AC
  Filled 2018-07-20: qty 2.7

## 2018-07-20 MED ORDER — LIDOCAINE HCL (PF) 2 % IJ SOLN
INTRAMUSCULAR | Status: AC
  Filled 2018-07-20: qty 20

## 2018-07-20 NOTE — Discharge Instructions (Addendum)
Apply ice packs on/off to your face to help reduce swelling.  Clean the wounds with mild soap and water.  Sutures out in 5-7 days.  Tylenol if needed for pain.  Follow-up with your doctor or return here for any worsening symptoms such as vomiting, severe headache or visual changes.
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
## 2018-07-20 NOTE — ED Notes (Signed)
Pt told this nurse that he was ready to go that he just needed a stitch or two and he needs to go. MD aware. Pt called this nurse back in room and notified nurse that he needs to leave.

## 2018-07-20 NOTE — ED Triage Notes (Signed)
Pt just eloped. Pt returns for stitches

## 2018-07-20 NOTE — ED Notes (Signed)
ETOH in board. Pt stated he drank "A couple beers"
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
## 2018-08-01 ENCOUNTER — Emergency Department (HOSPITAL_COMMUNITY)
Admission: EM | Admit: 2018-08-01 | Discharge: 2018-08-01 | Disposition: A | Discharge status: Home / Self Care | Payer: Self-pay | Attending: Emergency Medicine | Admitting: Emergency Medicine

## 2018-08-01 ENCOUNTER — Encounter (HOSPITAL_COMMUNITY): Payer: Self-pay | Admitting: Emergency Medicine

## 2018-08-01 ENCOUNTER — Other Ambulatory Visit: Payer: Self-pay

## 2018-08-01 DIAGNOSIS — F17210 Nicotine dependence, cigarettes, uncomplicated: Secondary | ICD-10-CM | POA: Insufficient documentation

## 2018-08-01 DIAGNOSIS — I10 Essential (primary) hypertension: Secondary | ICD-10-CM | POA: Insufficient documentation

## 2018-08-01 DIAGNOSIS — S0181XD Laceration without foreign body of other part of head, subsequent encounter: Secondary | ICD-10-CM | POA: Insufficient documentation

## 2018-08-01 DIAGNOSIS — X58XXXD Exposure to other specified factors, subsequent encounter: Secondary | ICD-10-CM | POA: Insufficient documentation

## 2018-08-01 DIAGNOSIS — Z4802 Encounter for removal of sutures: Secondary | ICD-10-CM

## 2018-08-01 NOTE — ED Notes (Signed)
Pt declined to wait for discharge paperwork. 4 sutures removed.

## 2018-08-01 NOTE — ED Triage Notes (Signed)
Pt needs suture removed  ?

## 2018-08-01 NOTE — Discharge Instructions (Addendum)
Keep clean and dry

## 2018-08-02 NOTE — ED Provider Notes (Signed)
[facility] EMERGENCY DEPARTMENT Provider Note   CSN: [id number] Arrival date & time: 08/01/18  1613    History   Chief Complaint Chief Complaint  Patient presents with  . Suture / Staple Removal    HPI Paul Lambert is a 43 y.o. male.     Chief complaint suture removal.  Status post laceration to left lateral frontal forehead approximately 1 week ago.  Wound is healing appropriately     Past Medical History:  Diagnosis Date  . Hypertension   . Seizures [facility])     Patient Active Problem List   Diagnosis Date Noted  . AKI (acute kidney injury) ([place]) 11/20/2017  . Hypertension 11/20/2017  . Polycythemia 11/20/2017  . Alcohol intoxication with delirium ([place]) 11/20/2017  . Rhabdomyolysis 11/20/2017  . Seizures ([place]) 11/20/2017  . Hypokalemia 11/20/2017  . Alcohol dependence ([place]) 11/20/2017  . Cocaine use 11/20/2017  . Alcoholic ketoacidosis 42/35/3614    Past Surgical History:  Procedure Laterality Date  . FACIAL COSMETIC SURGERY          Home Medications    Prior to Admission medications   Not on File    Family History Family History  Problem Relation Age of Onset  . Hypertension Mother   . Hypertension Father     Social History Social History   Tobacco Use  . Smoking status: Current Every Day Smoker    Packs/day: 0.50    Types: Cigarettes  . Smokeless tobacco: Never Used  Substance Use Topics  . Alcohol use: Yes    Alcohol/week: 40.0 - 52.0 standard drinks    Types: 24 - 36 Cans of beer, 16 Shots of liquor per week    Comment: occasionally   . Drug use: Yes    Types: Cocaine, Marijuana     Allergies   Onion   Review of Systems Review of Systems  All other systems reviewed and are negative.    Physical Exam Updated Vital Signs BP (!) 133/97 (BP Location: Right Arm)   Pulse 60   Temp 98.7 F (37.1 C) (Oral)   Resp 15   Ht 5\' 9"  (1.753 m)   Wt 81.6 kg   SpO2 99%   BMI 26.58 kg/m   Physical Exam Constitutional:    Appearance: Normal appearance.  HENT:     Head:     Comments: Healing laceration 2.0 cm in above location Skin:    General: Skin is warm and dry.  Neurological:     General: No focal deficit present.     Mental Status: He is alert and oriented to person, place, and time.  Psychiatric:        Mood and Affect: Mood normal.        Behavior: Behavior normal.      ED Treatments / Results  Labs (all labs ordered are listed, but only abnormal results are displayed) Labs Reviewed - No data to display  EKG None  Radiology No results found.  Procedures Procedures (including critical care time)  Medications Ordered in ED Medications - No data to display   Initial Impression / Assessment and Plan / ED Course  I have reviewed the triage vital signs and the nursing notes.  Pertinent labs & imaging results that were available during my care of the patient were reviewed by me and considered in my medical decision making (see chart for details).        Suture removal.  Wound appears well.  Final Clinical Impressions(s) / ED  Diagnoses   Final diagnoses:  Visit for suture removal    ED Discharge Orders    None       Donnetta Hutchingook, Poetry Cerro, MD 08/02/18 [id number]

## 2019-03-15 ENCOUNTER — Encounter (HOSPITAL_COMMUNITY): Payer: Self-pay | Admitting: Emergency Medicine

## 2019-03-15 ENCOUNTER — Other Ambulatory Visit: Payer: Self-pay

## 2019-03-15 ENCOUNTER — Emergency Department (HOSPITAL_COMMUNITY)
Admission: EM | Admit: 2019-03-15 | Discharge: 2019-03-15 | Disposition: A | Discharge status: Home / Self Care | Payer: Self-pay | Attending: Emergency Medicine | Admitting: Emergency Medicine

## 2019-03-15 DIAGNOSIS — F1010 Alcohol abuse, uncomplicated: Secondary | ICD-10-CM | POA: Insufficient documentation

## 2019-03-15 DIAGNOSIS — F17210 Nicotine dependence, cigarettes, uncomplicated: Secondary | ICD-10-CM | POA: Insufficient documentation

## 2019-03-15 DIAGNOSIS — I10 Essential (primary) hypertension: Secondary | ICD-10-CM | POA: Insufficient documentation

## 2019-03-15 MED ORDER — ONDANSETRON 8 MG PO TBDP
8.0000 mg | ORAL_TABLET | Freq: Once | ORAL | Status: AC
  Administered 2019-03-15: 8 mg via ORAL
  Filled 2019-03-15: qty 1

## 2019-03-15 MED ORDER — ACETAMINOPHEN 325 MG PO TABS
650.0000 mg | ORAL_TABLET | Freq: Once | ORAL | Status: AC
  Administered 2019-03-15: 650 mg via ORAL
  Filled 2019-03-15: qty 2

## 2019-03-15 NOTE — ED Notes (Signed)
Pt ambulated here from home and has been ambulating without difficulty in room.

## 2019-03-15 NOTE — Discharge Instructions (Addendum)
Avoid drinking alcohol.  Use the resource guide, attached, to help you find a facility to treat you for alcoholism.

## 2019-03-15 NOTE — ED Provider Notes (Signed)
[facility] EMERGENCY DEPARTMENT Provider Note   CSN: [id number] Arrival date & time: 03/15/19  2056     History Chief Complaint  Patient presents with  . Alcohol Intoxication    Paul Lambert is a 44 y.o. male.  HPI Patient states he needs help to stop drinking beer.  He states he drinks all day long, and has been doing this for about 30 years.  He states it makes his stomach hurt and causes a headache.  He is supposed to be taking a blood pressure medicine that he states is "5 mg."  He does not take that because he does not have any money to spend for blood pressure medicine.  He does not currently have a PCP.  He states he lives with his aunt, and that they get along okay.  He states that when he stopped drinking just starts again.  He does not have any symptoms of alcohol withdrawal.  He denies other recent illnesses.  There are no other known modifying factors.    Past Medical History:  Diagnosis Date  . Hypertension   . Seizures [facility])     Patient Active Problem List   Diagnosis Date Noted  . AKI (acute kidney injury) (HCC) 11/20/2017  . Hypertension 11/20/2017  . Polycythemia 11/20/2017  . Alcohol intoxication with delirium (HCC) 11/20/2017  . Rhabdomyolysis 11/20/2017  . Seizures (HCC) 11/20/2017  . Hypokalemia 11/20/2017  . Alcohol dependence (HCC) 11/20/2017  . Cocaine use 11/20/2017  . Alcoholic ketoacidosis 11/20/2017    Past Surgical History:  Procedure Laterality Date  . FACIAL COSMETIC SURGERY         Family History  Problem Relation Age of Onset  . Hypertension Mother   . Hypertension Father     Social History   Tobacco Use  . Smoking status: Current Every Day Smoker    Packs/day: 0.50    Types: Cigarettes  . Smokeless tobacco: Never Used  Substance Use Topics  . Alcohol use: Yes    Alcohol/week: 40.0 - 52.0 standard drinks    Types: 24 - 36 Cans of beer, 16 Shots of liquor per week    Comment: occasionally   . Drug use: Yes    Types:  Cocaine, Marijuana    Home Medications Prior to Admission medications   Not on File    Allergies    Onion  Review of Systems   Review of Systems  All other systems reviewed and are negative.   Physical Exam Updated Vital Signs BP (!) 142/109   Pulse (!) 112   Temp 98.4 F (36.9 C) (Oral)   Resp 17   SpO2 98%   Physical Exam Vitals and nursing note reviewed.  Constitutional:      General: He is not in acute distress.    Appearance: He is well-developed. He is not ill-appearing.  HENT:     Head: Normocephalic and atraumatic.     Right Ear: External ear normal.     Left Ear: External ear normal.  Eyes:     Conjunctiva/sclera: Conjunctivae normal.     Pupils: Pupils are equal, round, and reactive to light.  Neck:     Trachea: Phonation normal.  Cardiovascular:     Rate and Rhythm: Normal rate.  Pulmonary:     Effort: Pulmonary effort is normal.  Abdominal:     General: There is no distension.  Musculoskeletal:        General: Normal range of motion.     Cervical  back: Normal range of motion and neck supple.  Skin:    General: Skin is warm and dry.  Neurological:     Mental Status: He is alert and oriented to person, place, and time.     Cranial Nerves: No cranial nerve deficit.     Motor: No abnormal muscle tone.     Coordination: Coordination normal.     Comments: No dysarthria or aphasia.  Psychiatric:        Mood and Affect: Mood normal.        Behavior: Behavior normal.     ED Results / Procedures / Treatments   Labs (all labs ordered are listed, but only abnormal results are displayed) Labs Reviewed - No data to display  EKG None  Radiology No results found.  Procedures Procedures (including critical care time)  Medications Ordered in ED Medications - No data to display  ED Course  I have reviewed the triage vital signs and the nursing notes.  Pertinent labs & imaging results that were available during my care of the patient were  reviewed by me and considered in my medical decision making (see chart for details).    MDM Rules/Calculators/A&P                       Patient Vitals for the past 24 hrs:  BP Temp Temp src Pulse Resp SpO2  03/15/19 2250 -- -- -- 91 -- 99 %  03/15/19 2206 -- -- -- (!) 112 -- 98 %  03/15/19 2200 -- -- -- (!) 111 -- 99 %  03/15/19 2145 -- -- -- (!) 104 -- 96 %  03/15/19 2140 (!) 142/109 -- -- (!) 106 -- --  03/15/19 2140 (!) 142/104 -- -- (!) 109 -- 97 %  03/15/19 2113 (!) 150/108 98.4 F (36.9 C) Oral (!) 125 17 98 %    10:58 PM Reevaluation with update and discussion. After initial assessment and treatment, an updated evaluation reveals no change in clinical status, findings discussed with the patient. [name]   Medical Decision Making: Alcohol abuse, with desire to stop drinking.  No evidence for impending alcohol withdrawal syndrome.  No evidence for acute unstable medical condition.  Mild elevation of blood pressure and heart rate on arrival.  Heart rate improved spontaneously.  Paul Lambert was evaluated in Emergency Department on 03/15/2019 for the symptoms described in the history of present illness. He was evaluated in the context of the global COVID-19 pandemic, which necessitated consideration that the patient might be at risk for infection with the SARS-CoV-2 virus that causes COVID-19. Institutional protocols and algorithms that pertain to the evaluation of patients at risk for COVID-19 are in a state of rapid change based on information released by regulatory bodies including the CDC and federal and state organizations. These policies and algorithms were followed during the patient's care in the ED.   CRITICAL CARE-no Performed by: [name]   Nursing Notes Reviewed/ Care Coordinated Applicable Imaging Reviewed Interpretation of Laboratory Data incorporated into ED treatment  The patient appears reasonably screened and/or stabilized for discharge and I doubt any  other medical condition or other [facility] requiring further screening, evaluation, or treatment in the ED at this time prior to discharge.  Plan: Home Medications-use an antacid like Maalox or Mylanta for stomach discomfort.; Home Treatments-avoid drinking alcohol; return here if the recommended treatment, does not improve the symptoms; Recommended follow up-follow-up with AA, and a treatment facility as needed for  help with alcohol cessation.    Final Clinical Impression(s) / ED Diagnoses Final diagnoses:  None    Rx / DC Orders ED Discharge Orders    None       Daleen Bo, MD 03/15/19 2325

## 2019-03-15 NOTE — ED Triage Notes (Signed)
Pt states he "drank too much beer today." Stating that he wants help with his drinking. Denies pain.

## 2019-04-10 ENCOUNTER — Encounter (HOSPITAL_COMMUNITY): Payer: Self-pay | Admitting: *Deleted

## 2019-04-10 ENCOUNTER — Other Ambulatory Visit: Payer: Self-pay

## 2019-04-10 DIAGNOSIS — F10229 Alcohol dependence with intoxication, unspecified: Secondary | ICD-10-CM | POA: Insufficient documentation

## 2019-04-10 DIAGNOSIS — F339 Major depressive disorder, recurrent, unspecified: Secondary | ICD-10-CM | POA: Insufficient documentation

## 2019-04-10 DIAGNOSIS — I10 Essential (primary) hypertension: Secondary | ICD-10-CM | POA: Insufficient documentation

## 2019-04-10 DIAGNOSIS — F17210 Nicotine dependence, cigarettes, uncomplicated: Secondary | ICD-10-CM | POA: Insufficient documentation

## 2019-04-10 DIAGNOSIS — Z79899 Other long term (current) drug therapy: Secondary | ICD-10-CM | POA: Insufficient documentation

## 2019-04-10 DIAGNOSIS — Y908 Blood alcohol level of 240 mg/100 ml or more: Secondary | ICD-10-CM | POA: Insufficient documentation

## 2019-04-10 LAB — CBC WITH DIFFERENTIAL/PLATELET
Abs Immature Granulocytes: 0.01 10*3/uL (ref 0.00–0.07)
Basophils Absolute: 0.1 10*3/uL (ref 0.0–0.1)
Basophils Relative: 1 %
Eosinophils Absolute: 0.1 10*3/uL (ref 0.0–0.5)
Eosinophils Relative: 2 %
HCT: 51.1 % (ref 39.0–52.0)
Hemoglobin: 17.1 g/dL — ABNORMAL HIGH (ref 13.0–17.0)
Immature Granulocytes: 0 %
Lymphocytes Relative: 67 %
Lymphs Abs: 4 10*3/uL (ref 0.7–4.0)
MCH: 31.8 pg (ref 26.0–34.0)
MCHC: 33.5 g/dL (ref 30.0–36.0)
MCV: 95.2 fL (ref 80.0–100.0)
Monocytes Absolute: 0.4 10*3/uL (ref 0.1–1.0)
Monocytes Relative: 7 %
Neutro Abs: 1.4 10*3/uL — ABNORMAL LOW (ref 1.7–7.7)
Neutrophils Relative %: 23 %
Platelets: 276 10*3/uL (ref 150–400)
RBC: 5.37 MIL/uL (ref 4.22–5.81)
RDW: 13 % (ref 11.5–15.5)
WBC: 6 10*3/uL (ref 4.0–10.5)
nRBC: 0 % (ref 0.0–0.2)

## 2019-04-10 LAB — COMPREHENSIVE METABOLIC PANEL
ALT: 38 U/L (ref 0–44)
AST: 47 U/L — ABNORMAL HIGH (ref 15–41)
Albumin: 4.7 g/dL (ref 3.5–5.0)
Alkaline Phosphatase: 60 U/L (ref 38–126)
Anion gap: 11 (ref 5–15)
BUN: 6 mg/dL (ref 6–20)
CO2: 29 mmol/L (ref 22–32)
Calcium: 9.1 mg/dL (ref 8.9–10.3)
Chloride: 100 mmol/L (ref 98–111)
Creatinine, Ser: 1.1 mg/dL (ref 0.61–1.24)
GFR calc Af Amer: >60 mL/min (ref >60)
GFR calc non Af Amer: >60 mL/min (ref >60)
Glucose, Bld: 102 mg/dL — ABNORMAL HIGH (ref 70–99)
Potassium: 3.9 mmol/L (ref 3.5–5.1)
Sodium: 140 mmol/L (ref 135–145)
Total Bilirubin: 1.2 mg/dL (ref 0.3–1.2)
Total Protein: 8.4 g/dL — ABNORMAL HIGH (ref 6.5–8.1)

## 2019-04-10 LAB — ETHANOL: Alcohol, Ethyl (B): 283 mg/dL — ABNORMAL HIGH (ref <10)

## 2019-04-10 NOTE — ED Triage Notes (Signed)
Pt states that he wants to be sent off to help with his drinking,

## 2019-04-11 ENCOUNTER — Emergency Department (HOSPITAL_COMMUNITY)
Admission: EM | Admit: 2019-04-11 | Discharge: 2019-04-11 | Disposition: A | Discharge status: Home / Self Care | Payer: Self-pay | Attending: Emergency Medicine | Admitting: Emergency Medicine

## 2019-04-11 DIAGNOSIS — F1010 Alcohol abuse, uncomplicated: Secondary | ICD-10-CM

## 2019-04-11 LAB — CK: Total CK: 588 U/L — ABNORMAL HIGH (ref 49–397)

## 2019-04-11 LAB — RAPID URINE DRUG SCREEN, HOSP PERFORMED
Amphetamines: NOT DETECTED
Barbiturates: NOT DETECTED
Benzodiazepines: NOT DETECTED
Cocaine: POSITIVE — AB
Opiates: NOT DETECTED
Tetrahydrocannabinol: POSITIVE — AB

## 2019-04-11 MED ORDER — LORAZEPAM 1 MG PO TABS: 1.0000 mg | ORAL_TABLET | ORAL | Status: DC | PRN

## 2019-04-11 MED ORDER — LORAZEPAM 1 MG PO TABS: 0.0000 mg | ORAL_TABLET | Freq: Two times a day (BID) | ORAL | Status: DC

## 2019-04-11 MED ORDER — ONDANSETRON 8 MG PO TBDP
8.0000 mg | ORAL_TABLET | Freq: Once | ORAL | Status: AC
  Administered 2019-04-11: 8 mg via ORAL
  Filled 2019-04-11: qty 1

## 2019-04-11 MED ORDER — FOLIC ACID 1 MG PO TABS
1.0000 mg | ORAL_TABLET | Freq: Every day | ORAL | Status: DC
  Administered 2019-04-11: 1 mg via ORAL
  Filled 2019-04-11: qty 1

## 2019-04-11 MED ORDER — LORAZEPAM 1 MG PO TABS
0.0000 mg | ORAL_TABLET | Freq: Four times a day (QID) | ORAL | Status: DC
  Administered 2019-04-11: 01:00:00 1 mg via ORAL
  Filled 2019-04-11: qty 1

## 2019-04-11 MED ORDER — ADULT MULTIVITAMIN W/MINERALS CH
1.0000 | ORAL_TABLET | Freq: Every day | ORAL | Status: DC
  Administered 2019-04-11: 1 via ORAL
  Filled 2019-04-11: qty 1

## 2019-04-11 MED ORDER — LORAZEPAM 2 MG/ML IJ SOLN: 1.0000 mg | INTRAMUSCULAR | Status: DC | PRN

## 2019-04-11 MED ORDER — THIAMINE HCL 100 MG/ML IJ SOLN: 100.0000 mg | Freq: Every day | INTRAMUSCULAR | Status: DC

## 2019-04-11 MED ORDER — THIAMINE HCL 100 MG PO TABS
100.0000 mg | ORAL_TABLET | Freq: Every day | ORAL | Status: DC
  Administered 2019-04-11: 10:00:00 100 mg via ORAL
  Filled 2019-04-11: qty 1

## 2019-04-11 NOTE — BH Assessment (Signed)
Tele Assessment Note   Patient Name: Paul Lambert MRN: [id number] Referring Physician: Dr. Daleen Bo Location of Patient: [facility] Location of Provider: [place] is an 44 y.o. male presenting voluntarily requesting alcohol detox. When asked how much alcohol do you drink, patient states "drink a lot". Patient reported long history of drinking with last drink being 2-3 hours ago. Patient reported history of rehab for alcohol treatment was long time ago. Patient reports worsening depression due to alcohol abuse. Depressive symptoms, insomnia, crying spells, isolating, guilt, loss of interest and feelings of worhlessness. Patient reported fleeting thoughts of suicide or just wanting to hurt himself often times when he is drinking, however none at the time of assessment. Patient denied history of suicide attempts and self-harming behaviors. Patient denied current SI, HI and psychosis at this time. Patient reported he need resources and support to help him get his life back together.   Collateral Contact: None provided  UDS +cocaine, +marijuana BAL 284   Diagnosis: Alcohol dependence and Major depressive disorder  Past Medical History:  Past Medical History:  Diagnosis Date  . Hypertension   . Seizures ([place])     Past Surgical History:  Procedure Laterality Date  . FACIAL COSMETIC SURGERY      Family History:  Family History  Problem Relation Age of Onset  . Hypertension Mother   . Hypertension Father     Social History:  reports that he has been smoking cigarettes. He has been smoking about 0.50 packs per day. He has never used smokeless tobacco. He reports current alcohol use of about 40.0 - 52.0 standard drinks of alcohol per week. He reports current drug use. Drugs: Cocaine and Marijuana.  Additional Social History:  Alcohol / Drug Use Pain Medications: see MAR Prescriptions: see MAR Over the Counter: see MAR  CIWA: CIWA-Ar BP: (!)  131/96 Pulse Rate: 71 Nausea and Vomiting: mild nausea with no vomiting Tactile Disturbances: very mild itching, pins and needles, burning or numbness Tremor: no tremor Auditory Disturbances: very mild harshness or ability to frighten Paroxysmal Sweats: barely perceptible sweating, palms moist Visual Disturbances: not present Anxiety: mildly anxious Headache, Fullness in Head: mild Agitation: normal activity Orientation and Clouding of Sensorium: oriented and can do serial additions CIWA-Ar Total: 7 COWS:    Allergies:  Allergies  Allergen Reactions  . Onion Anaphylaxis    OTHER: lettuce.    Home Medications: (Not in a hospital admission)   OB/GYN Status:  No LMP for male patient.  General Assessment Data Location of Assessment: [facility] TTS Assessment: In system Is this a Tele or Face-to-Face Assessment?: Tele Assessment Is this an Initial Assessment or a Re-assessment for this encounter?: Initial Assessment Patient Accompanied by:: N/A Language Other than English: No Living Arrangements: (family home) What gender do you identify as?: Male Marital status: Single Living Arrangements: Other relatives, Other (Comment) Can pt return to current living arrangement?: Yes Admission Status: Voluntary Is patient capable of signing voluntary admission?: Yes Referral Source: Self/Family/Friend  Crisis Care Plan Living Arrangements: Other relatives, Other (Comment) Legal Guardian: Other:(self) Name of Psychiatrist: (none) Name of Therapist: (none)  Education Status Is patient currently in school?: No Is the patient employed, unemployed or receiving disability?: Unemployed  Risk to self with the past 6 months Suicidal Ideation: No-Not Currently/Within Last 6 Months Has patient been a risk to self within the past 6 months prior to admission? : Yes Suicidal Intent: Yes-Currently Present Has patient had any suicidal  intent within the past 6 months prior to admission? : Yes Is  patient at risk for suicide?: Yes Suicidal Plan?: No-Not Currently/Within Last 6 Months Has patient had any suicidal plan within the past 6 months prior to admission? : Yes Access to Means: Yes Specify Access to Suicidal Means: (unknown) What has been your use of drugs/alcohol within the last 12 months?: (marijuana and cocaine) Previous Attempts/Gestures: No How many times?: (0) Other Self Harm Risks: 0 Triggers for Past Attempts: Other (Comment)(n/a) Intentional Self Injurious Behavior: None Family Suicide History: No Recent stressful life event(s): Conflict (Comment) Persecutory voices/beliefs?: No Depression: No Depression Symptoms: Insomnia, Tearfulness, Isolating, Fatigue, Guilt, Loss of interest in usual pleasures, Feeling worthless/self pity Substance abuse history and/or treatment for substance abuse?: No Suicide prevention information given to non-admitted patients: Yes  Risk to Others within the past 6 months Homicidal Ideation: No Does patient have any lifetime risk of violence toward others beyond the six months prior to admission? : No Thoughts of Harm to Others: No Current Homicidal Intent: No Current Homicidal Plan: No Access to Homicidal Means: No Describe Access to Homicidal Means: (n/a) Identified Victim: (none) History of harm to others?: No Assessment of Violence: None Noted Violent Behavior Description: (n/a) Does patient have access to weapons?: No Criminal Charges Pending?: No Does patient have a court date: No Is patient on probation?: No  Psychosis Hallucinations: None noted Delusions: None noted  Mental Status Report Appearance/Hygiene: Unremarkable Eye Contact: Poor Motor Activity: Freedom of movement Speech: Logical/coherent Level of Consciousness: Drowsy Mood: Depressed Affect: Appropriate to circumstance, Depressed Anxiety Level: Minimal Thought Processes: Coherent, Relevant Judgement: Impaired Orientation: Person, Place, Time,  Situation, Appropriate for developmental age Obsessive Compulsive Thoughts/Behaviors: None  Cognitive Functioning Concentration: Fair Memory: Recent Intact Is patient IDD: No Insight: Fair Impulse Control: Fair Appetite: Fair Have you had any weight changes? : No Change Sleep: No Change Total Hours of Sleep: (9-10) Vegetative Symptoms: None  ADLScreening [facility] Assessment Services) Patient's cognitive ability adequate to safely complete daily activities?: Yes Patient able to express need for assistance with ADLs?: Yes Independently performs ADLs?: Yes (appropriate for developmental age)  Prior Inpatient Therapy Prior Inpatient Therapy: Yes Prior Therapy Dates: (2019) Prior Therapy Facilty/Provider(s): ([facility]) Reason for Treatment: (mental illness)  Prior Outpatient Therapy Prior Outpatient Therapy: No Does patient have an ACCT team?: No Does patient have Intensive In-House Services?  : No Does patient have [facility] services? : No Does patient have P4CC services?: No  ADL Screening (condition at time of admission) Patient's cognitive ability adequate to safely complete daily activities?: Yes Patient able to express need for assistance with ADLs?: Yes Independently performs ADLs?: Yes (appropriate for developmental age)   Merchant [employer] officer (For Healthcare) Does Patient Have a Medical Advance Directive?: No   Disposition:  Disposition Initial Assessment Completed for this Encounter: Yes  Adaku Anike, NP, recommends overnight observation for safety and stabilization with psych reassessment in the AM.   This service was provided via telemedicine using a 2-way, interactive audio and video technology.  Names of all persons participating in this telemedicine service and their role in this encounter. Name: Clovis Cao Role: Patient  Name: Al Corpus Role: TTS Clinician  Name:  Role:   Name:  Role:     Burnetta Sabin 04/11/2019 5:25 AM

## 2019-04-11 NOTE — ED Notes (Signed)
TTS assessment in progress at this time 

## 2019-04-11 NOTE — ED Provider Notes (Signed)
[facility] EMERGENCY DEPARTMENT Provider Note   CSN: [id number] Arrival date & time: 04/10/19  1912     History Chief Complaint  Patient presents with  . V70.1    Paul Lambert is a 44 y.o. male.  Patient with history of alcohol abuse here with requesting detox from alcohol.  States he drinks a lot of beer on a regular basis but cannot quantify how much.  Last drink was about 2 or 3 hours ago.  States she wants to go to rehab and wants help to quit drinking.  Occasionally has episodes of nausea and vomiting abdominal pain in the setting of alcohol intoxication but none currently.  Denies any black or bloody stools.  Denies any chest pain or shortness of breath.  States he has been to rehab before.  He denies any other drug use except marijuana.  He denies any suicidal thoughts or homicidal thoughts.  Denies hearing any voices.  States he just "needs to get my life together".  The history is provided by the patient.       Past Medical History:  Diagnosis Date  . Hypertension   . Seizures [facility])     Patient Active Problem List   Diagnosis Date Noted  . AKI (acute kidney injury) (HCC) 11/20/2017  . Hypertension 11/20/2017  . Polycythemia 11/20/2017  . Alcohol intoxication with delirium (HCC) 11/20/2017  . Rhabdomyolysis 11/20/2017  . Seizures (HCC) 11/20/2017  . Hypokalemia 11/20/2017  . Alcohol dependence (HCC) 11/20/2017  . Cocaine use 11/20/2017  . Alcoholic ketoacidosis 11/20/2017    Past Surgical History:  Procedure Laterality Date  . FACIAL COSMETIC SURGERY         Family History  Problem Relation Age of Onset  . Hypertension Mother   . Hypertension Father     Social History   Tobacco Use  . Smoking status: Current Every Day Smoker    Packs/day: 0.50    Types: Cigarettes  . Smokeless tobacco: Never Used  Substance Use Topics  . Alcohol use: Yes    Alcohol/week: 40.0 - 52.0 standard drinks    Types: 24 - 36 Cans of beer, 16 Shots of liquor per week    Comment: occasionally   . Drug use: Yes    Types: Cocaine, Marijuana    Home Medications Prior to Admission medications   Not on File    Allergies    Onion  Review of Systems   Review of Systems  Constitutional: Positive for fatigue. Negative for activity change, appetite change and fever.  HENT: Negative for congestion.   Respiratory: Negative for cough, chest tightness and shortness of breath.   Cardiovascular: Negative for chest pain.  Gastrointestinal: Positive for nausea. Negative for abdominal pain.  Genitourinary: Negative for dysuria and hematuria.  Musculoskeletal: Negative for arthralgias, back pain and myalgias.  Skin: Negative for rash.  Neurological: Negative for dizziness, weakness and headaches.  Psychiatric/Behavioral: Positive for sleep disturbance. Negative for agitation, confusion and suicidal ideas. The patient is nervous/anxious.    all other systems are negative except as noted in the HPI and PMH.    Physical Exam Updated Vital Signs BP (!) 155/115   Pulse (!) 101   Temp 98.4 F (36.9 C) (Oral)   Resp 18   Ht 5\' 9"  (1.753 m)   Wt 86.2 kg   SpO2 97%   BMI 28.06 kg/m   Physical Exam Vitals and nursing note reviewed.  Constitutional:      General: He is not in  acute distress.    Appearance: He is well-developed.     Comments: intoxicated  HENT:     Head: Normocephalic and atraumatic.     Mouth/Throat:     Pharynx: No oropharyngeal exudate.  Eyes:     Conjunctiva/sclera: Conjunctivae normal.     Pupils: Pupils are equal, round, and reactive to light.  Neck:     Comments: No meningismus. Cardiovascular:     Rate and Rhythm: Normal rate and regular rhythm.     Heart sounds: Normal heart sounds. No murmur.  Pulmonary:     Effort: Pulmonary effort is normal. No respiratory distress.     Breath sounds: Normal breath sounds.  Abdominal:     Palpations: Abdomen is soft.     Tenderness: There is no abdominal tenderness. There is no guarding  or rebound.  Musculoskeletal:        General: No tenderness. Normal range of motion.     Cervical back: Normal range of motion and neck supple.  Skin:    General: Skin is warm.  Neurological:     Mental Status: He is alert and oriented to person, place, and time.     Cranial Nerves: No cranial nerve deficit.     Motor: No abnormal muscle tone.     Coordination: Coordination normal.     Comments: No ataxia on finger to nose bilaterally. No pronator drift. 5/5 strength throughout. CN 2-12 intact.Equal grip strength. Sensation intact.   Psychiatric:        Behavior: Behavior normal.     ED Results / Procedures / Treatments   Labs (all labs ordered are listed, but only abnormal results are displayed) Labs Reviewed  COMPREHENSIVE METABOLIC PANEL - Abnormal; Notable for the following components:      Result Value   Glucose, Bld 102 (*)    Total Protein 8.4 (*)    AST 47 (*)    All other components within normal limits  ETHANOL - Abnormal; Notable for the following components:   Alcohol, Ethyl (B) 283 (*)    All other components within normal limits  RAPID URINE DRUG SCREEN, HOSP PERFORMED - Abnormal; Notable for the following components:   Cocaine POSITIVE (*)    Tetrahydrocannabinol POSITIVE (*)    All other components within normal limits  CBC WITH DIFFERENTIAL/PLATELET - Abnormal; Notable for the following components:   Hemoglobin 17.1 (*)    Neutro Abs 1.4 (*)    All other components within normal limits  CK - Abnormal; Notable for the following components:   Total CK 588 (*)    All other components within normal limits  RESPIRATORY PANEL BY RT PCR (FLU A&B, COVID)    EKG None  Radiology No results found.  Procedures Procedures (including critical care time)  Medications Ordered in ED Medications - No data to display  ED Course  I have reviewed the triage vital signs and the nursing notes.  Pertinent labs & imaging results that were available during my care of  the patient were reviewed by me and considered in my medical decision making (see chart for details).    MDM Rules/Calculators/A&P                      Patient here with alcohol abuse and intoxication requesting detox.  He is calm and cooperative.  He is not tremulous or tachycardic.  Labs with Alcohol intoxication.  UDS positive for cocaine and marijuana.  Patient calm and cooperative.  He  states he sometimes feels suicidal and depressed when he was drinking but denies having any suicidal plan currently.  Holding orders placed including alcohol withdrawal protocol. He is medically clear for TTS evaluation. Final Clinical Impression(s) / ED Diagnoses Final diagnoses:  Alcohol abuse    Rx / DC Orders ED Discharge Orders    None       Clarance Bollard, Jeannett Senior, MD 04/11/19 [phone]

## 2019-04-11 NOTE — Progress Notes (Signed)
Patient ID: Paul Lambert, male   DOB: [birth year], 44 y.o.   MRN: [id number]   Psychiatric reassessment   In brief; Paul Lambert is an 44 y.o. male who presented to [facility] voluntarily requesting alcohol detox. Due to his alcohol addiction, patient endorsed depression and suicidal thoughts.   During this evaluation, patient is alert and oriented x4, calm and cooperative. He acknowledges his alcohol addiction,. He states that he has been drinking alcohol, mostly beer, since a teenager. He reports drinking daily. He adds that he smokes [place] although denies other substance abuse or use. He reports in the distant past, he has received substance abuse treatment and participated in a rehabilitation program. He continues to endorse depressive mood again, secondary to his alcohol abuse and although denies active SI with plan or intent.  He denies AVH or other psychosis.  Denies homicidal thoughts, history of suicide attempts and self-harming behaviors. He states that he is wanting inpatient substance abuse treatment to help with his alcohol addiction. He denies other concerns at this time.    Disposition: Patient denies SI, HI or psychosis  He states he is in need for substance abuse treatment for his alcohol addiction. He states he prefers inpatient treatment.  He does not meet criteria for inpatient psychiatric hospitalization although I agree that substance abuse treatment is needed. I will speak to CSW about resources. Peer support will be helpful and I will asked the ED if these services are available.  At this time, patient is psychiatrically cleared.   ED updated on disposition.

## 2019-04-11 NOTE — Progress Notes (Signed)
CSW spoke with pt, who is open to receiving help through the [facility]. CSW arranged transportation through [facility]. Transportation staff will contact pt's RN, [name], when they are in the [facility] ED parking lot.   Wells Guiles, LCSW, LCAS Disposition CSW [facility]/TTS [phone]

## 2019-04-11 NOTE — Discharge Instructions (Addendum)
Go to [facility] now for treatment.

## 2019-04-12 ENCOUNTER — Other Ambulatory Visit: Payer: Self-pay

## 2019-04-12 ENCOUNTER — Emergency Department (HOSPITAL_COMMUNITY)
Admission: EM | Admit: 2019-04-12 | Discharge: 2019-04-12 | Disposition: A | Discharge status: Home / Self Care | Payer: Self-pay

## 2019-04-12 NOTE — ED Notes (Signed)
Called pt X 1. Pt stated "go ahead I'm waiting on my girl."

## 2019-04-27 ENCOUNTER — Emergency Department (HOSPITAL_COMMUNITY): Payer: Self-pay

## 2019-04-27 ENCOUNTER — Other Ambulatory Visit: Payer: Self-pay

## 2019-04-27 ENCOUNTER — Encounter (HOSPITAL_COMMUNITY): Payer: Self-pay

## 2019-04-27 ENCOUNTER — Emergency Department (HOSPITAL_COMMUNITY)
Admission: EM | Admit: 2019-04-27 | Discharge: 2019-04-27 | Disposition: A | Discharge status: Home / Self Care | Payer: Self-pay | Attending: Emergency Medicine | Admitting: Emergency Medicine

## 2019-04-27 DIAGNOSIS — F17210 Nicotine dependence, cigarettes, uncomplicated: Secondary | ICD-10-CM | POA: Insufficient documentation

## 2019-04-27 DIAGNOSIS — I10 Essential (primary) hypertension: Secondary | ICD-10-CM | POA: Insufficient documentation

## 2019-04-27 DIAGNOSIS — K852 Alcohol induced acute pancreatitis without necrosis or infection: Secondary | ICD-10-CM | POA: Insufficient documentation

## 2019-04-27 LAB — COMPREHENSIVE METABOLIC PANEL
ALT: 43 U/L (ref 0–44)
AST: 84 U/L — ABNORMAL HIGH (ref 15–41)
Albumin: 4.4 g/dL (ref 3.5–5.0)
Alkaline Phosphatase: 49 U/L (ref 38–126)
Anion gap: 15 (ref 5–15)
BUN: 9 mg/dL (ref 6–20)
CO2: 22 mmol/L (ref 22–32)
Calcium: 8.4 mg/dL — ABNORMAL LOW (ref 8.9–10.3)
Chloride: 100 mmol/L (ref 98–111)
Creatinine, Ser: 0.98 mg/dL (ref 0.61–1.24)
GFR calc Af Amer: >60 mL/min (ref >60)
GFR calc non Af Amer: >60 mL/min (ref >60)
Glucose, Bld: 92 mg/dL (ref 70–99)
Potassium: 3.5 mmol/L (ref 3.5–5.1)
Sodium: 137 mmol/L (ref 135–145)
Total Bilirubin: 1.5 mg/dL — ABNORMAL HIGH (ref 0.3–1.2)
Total Protein: 7.5 g/dL (ref 6.5–8.1)

## 2019-04-27 LAB — CBC
HCT: 45.9 % (ref 39.0–52.0)
Hemoglobin: 16.1 g/dL (ref 13.0–17.0)
MCH: 32.4 pg (ref 26.0–34.0)
MCHC: 35.1 g/dL (ref 30.0–36.0)
MCV: 92.4 fL (ref 80.0–100.0)
Platelets: 250 10*3/uL (ref 150–400)
RBC: 4.97 MIL/uL (ref 4.22–5.81)
RDW: 12.3 % (ref 11.5–15.5)
WBC: 5.1 10*3/uL (ref 4.0–10.5)
nRBC: 0 % (ref 0.0–0.2)

## 2019-04-27 LAB — URINALYSIS, ROUTINE W REFLEX MICROSCOPIC
Bacteria, UA: NONE SEEN
Bilirubin Urine: NEGATIVE
Glucose, UA: NEGATIVE mg/dL
Ketones, ur: NEGATIVE mg/dL
Leukocytes,Ua: NEGATIVE
Nitrite: NEGATIVE
Protein, ur: NEGATIVE mg/dL
Specific Gravity, Urine: 1.002 — ABNORMAL LOW (ref 1.005–1.030)
pH: 6 (ref 5.0–8.0)

## 2019-04-27 LAB — LIPASE, BLOOD: Lipase: 117 U/L — ABNORMAL HIGH (ref 11–51)

## 2019-04-27 LAB — ETHANOL: Alcohol, Ethyl (B): 295 mg/dL — ABNORMAL HIGH (ref <10)

## 2019-04-27 MED ORDER — FAMOTIDINE 20 MG PO TABS: 20.0000 mg | ORAL_TABLET | Freq: Two times a day (BID) | ORAL | 0 refills | Status: DC

## 2019-04-27 MED ORDER — SODIUM CHLORIDE 0.9 % IV BOLUS
1000.0000 mL | Freq: Once | INTRAVENOUS | Status: AC
  Administered 2019-04-27: 1000 mL via INTRAVENOUS

## 2019-04-27 MED ORDER — PANTOPRAZOLE SODIUM 40 MG IV SOLR
40.0000 mg | Freq: Once | INTRAVENOUS | Status: AC
  Administered 2019-04-27: 40 mg via INTRAVENOUS
  Filled 2019-04-27: qty 40

## 2019-04-27 MED ORDER — ONDANSETRON HCL 4 MG/2ML IJ SOLN
4.0000 mg | Freq: Once | INTRAMUSCULAR | Status: AC
  Administered 2019-04-27: 21:00:00 4 mg via INTRAVENOUS
  Filled 2019-04-27: qty 2

## 2019-04-27 MED ORDER — ONDANSETRON 4 MG PO TBDP: ORAL_TABLET | ORAL | 0 refills | Status: DC

## 2019-04-27 MED ORDER — MORPHINE SULFATE (PF) 4 MG/ML IV SOLN
4.0000 mg | Freq: Once | INTRAVENOUS | Status: AC
  Administered 2019-04-27: 21:00:00 4 mg via INTRAVENOUS
  Filled 2019-04-27: qty 1

## 2019-04-27 NOTE — ED Provider Notes (Signed)
[facility] EMERGENCY DEPARTMENT Provider Note   CSN: [id number] Arrival date & time: 04/27/19  1920     History Chief Complaint  Patient presents with  . Abdominal Pain    Paul Lambert is a 44 y.o. male.  Patient has a history of alcohol abuse.  Patient complains of abdominal pain and nausea  The history is provided by the patient. No language interpreter was used.  Abdominal Pain Pain location:  Generalized Pain quality: aching   Pain radiates to:  Does not radiate Pain severity:  Moderate Onset quality:  Sudden Timing:  Constant Chronicity:  New Context: alcohol use   Associated symptoms: no chest pain, no cough, no diarrhea, no fatigue and no hematuria        Past Medical History:  Diagnosis Date  . Hypertension   . Seizures [facility])     Patient Active Problem List   Diagnosis Date Noted  . AKI (acute kidney injury) (HCC) 11/20/2017  . Hypertension 11/20/2017  . Polycythemia 11/20/2017  . Alcohol intoxication with delirium (HCC) 11/20/2017  . Rhabdomyolysis 11/20/2017  . Seizures (HCC) 11/20/2017  . Hypokalemia 11/20/2017  . Alcohol dependence (HCC) 11/20/2017  . Cocaine use 11/20/2017  . Alcoholic ketoacidosis 11/20/2017    Past Surgical History:  Procedure Laterality Date  . FACIAL COSMETIC SURGERY         Family History  Problem Relation Age of Onset  . Hypertension Mother   . Hypertension Father     Social History   Tobacco Use  . Smoking status: Current Every Day Smoker    Packs/day: 0.50    Types: Cigarettes  . Smokeless tobacco: Never Used  Substance Use Topics  . Alcohol use: Yes    Alcohol/week: 40.0 - 52.0 standard drinks    Types: 24 - 36 Cans of beer, 16 Shots of liquor per week    Comment: occasionally   . Drug use: Yes    Types: Cocaine, Marijuana    Home Medications Prior to Admission medications   Medication Sig Start Date End Date Taking? Authorizing Provider  famotidine (PEPCID) 20 MG tablet Take 1 tablet (20 mg  total) by mouth 2 (two) times daily. 04/27/19   Bethann Berkshire, MD  ondansetron (ZOFRAN ODT) 4 MG disintegrating tablet 4mg  ODT q4 hours prn nausea/vomit 04/27/19   04/29/19, MD    Allergies    Onion  Review of Systems   Review of Systems  Constitutional: Negative for appetite change and fatigue.  HENT: Negative for congestion, ear discharge and sinus pressure.   Eyes: Negative for discharge.  Respiratory: Negative for cough.   Cardiovascular: Negative for chest pain.  Gastrointestinal: Positive for abdominal pain. Negative for diarrhea.  Genitourinary: Negative for frequency and hematuria.  Musculoskeletal: Negative for back pain.  Skin: Negative for rash.  Neurological: Negative for seizures and headaches.  Psychiatric/Behavioral: Negative for hallucinations.    Physical Exam Updated Vital Signs BP (!) 163/109   Pulse 90   Temp 98.4 F (36.9 C)   Resp 20   Ht 5\' 8"  (1.727 m)   Wt 86.2 kg   SpO2 98%   BMI 28.89 kg/m   Physical Exam Vitals and nursing note reviewed.  Constitutional:      Appearance: He is well-developed.  HENT:     Head: Normocephalic.     Nose: Nose normal.  Eyes:     General: No scleral icterus.    Conjunctiva/sclera: Conjunctivae normal.  Neck:     Thyroid: No thyromegaly.  Cardiovascular:     Rate and Rhythm: Normal rate and regular rhythm.     Heart sounds: No murmur. No friction rub. No gallop.   Pulmonary:     Breath sounds: No stridor. No wheezing or rales.  Chest:     Chest wall: No tenderness.  Abdominal:     General: There is no distension.     Tenderness: There is abdominal tenderness. There is no rebound.  Musculoskeletal:        General: Normal range of motion.     Cervical back: Neck supple.  Lymphadenopathy:     Cervical: No cervical adenopathy.  Skin:    Findings: No erythema or rash.  Neurological:     Mental Status: He is oriented to person, place, and time.     Motor: No abnormal muscle tone.     Coordination:  Coordination normal.  Psychiatric:        Behavior: Behavior normal.     ED Results / Procedures / Treatments   Labs (all labs ordered are listed, but only abnormal results are displayed) Labs Reviewed  LIPASE, BLOOD - Abnormal; Notable for the following components:      Result Value   Lipase 117 (*)    All other components within normal limits  COMPREHENSIVE METABOLIC PANEL - Abnormal; Notable for the following components:   Calcium 8.4 (*)    AST 84 (*)    Total Bilirubin 1.5 (*)    All other components within normal limits  URINALYSIS, ROUTINE W REFLEX MICROSCOPIC - Abnormal; Notable for the following components:   Color, Urine STRAW (*)    Specific Gravity, Urine 1.002 (*)    Hgb urine dipstick SMALL (*)    All other components within normal limits  ETHANOL - Abnormal; Notable for the following components:   Alcohol, Ethyl (B) 295 (*)    All other components within normal limits  CBC    EKG None  Radiology DG ABD ACUTE 2+V W 1V CHEST  Result Date: 04/27/2019 CLINICAL DATA:  Abdominal pain EXAM: DG ABDOMEN ACUTE W/ 1V CHEST COMPARISON:  None. FINDINGS: There is no evidence of dilated bowel loops or free intraperitoneal air. No radiopaque calculi or other significant radiographic abnormality is seen. Heart size and mediastinal contours are within normal limits. Both lungs are clear. IMPRESSION: Negative abdominal radiographs.  No acute cardiopulmonary disease. Electronically Signed   By: Macy Mis M.D.   On: 04/27/2019 22:02    Procedures Procedures (including critical care time)  Medications Ordered in ED Medications  pantoprazole (PROTONIX) injection 40 mg (40 mg Intravenous Given 04/27/19 2120)  sodium chloride 0.9 % bolus 1,000 mL (1,000 mLs Intravenous New Bag/Given 04/27/19 2117)  ondansetron (ZOFRAN) injection 4 mg (4 mg Intravenous Given 04/27/19 2120)  morphine 4 MG/ML injection 4 mg (4 mg Intravenous Given 04/27/19 2120)    ED Course  I have reviewed  the triage vital signs and the nursing notes.  Pertinent labs & imaging results that were available during my care of the patient were reviewed by me and considered in my medical decision making (see chart for details).    MDM Rules/Calculators/A&P                      Patient with alcohol abuse and pancreatitis.  Patient improved with nausea medicine protonic and fluids.  He is sent home with Zofran and Pepcid and told to only take liquids other than alcohol for the next couple days and  follow-up with his family doctor or GI Final Clinical Impression(s) / ED Diagnoses Final diagnoses:  Alcohol-induced acute pancreatitis without infection or necrosis    Rx / DC Orders ED Discharge Orders         Ordered    ondansetron (ZOFRAN ODT) 4 MG disintegrating tablet     04/27/19 2214    famotidine (PEPCID) 20 MG tablet  2 times daily     04/27/19 2214           Bethann Berkshire, MD 04/27/19 2219

## 2019-04-27 NOTE — ED Triage Notes (Signed)
Pt c/o abdominal pain x2 days due to excessive drinking. States he drinks all day long. Hasnt had anything today due to stomach pains. +n/v

## 2019-04-27 NOTE — ED Notes (Signed)
Pt transported to xray 

## 2019-04-27 NOTE — Discharge Instructions (Signed)
stop drinking alcohol.  Take only liquids for the next 1 to 2 days to settle your stomach.  Follow-up with your family doctor or dr. Darrick Penna this week

## 2019-09-14 ENCOUNTER — Emergency Department (HOSPITAL_COMMUNITY)
Admission: EM | Admit: 2019-09-14 | Discharge: 2019-09-14 | Disposition: A | Discharge status: Home / Self Care | Payer: Self-pay | Attending: Emergency Medicine | Admitting: Emergency Medicine

## 2019-09-14 ENCOUNTER — Other Ambulatory Visit: Payer: Self-pay

## 2019-09-14 ENCOUNTER — Encounter (HOSPITAL_COMMUNITY): Payer: Self-pay | Admitting: *Deleted

## 2019-09-14 DIAGNOSIS — Z5321 Procedure and treatment not carried out due to patient leaving prior to being seen by health care provider: Secondary | ICD-10-CM | POA: Insufficient documentation

## 2019-09-14 DIAGNOSIS — H5711 Ocular pain, right eye: Secondary | ICD-10-CM | POA: Insufficient documentation

## 2019-09-14 NOTE — ED Notes (Signed)
Not in waiting area.  

## 2019-09-14 NOTE — ED Triage Notes (Signed)
Pain in right eye, thinks he may have gotten a foreign body in right eye 4 days ago.

## 2019-10-26 ENCOUNTER — Other Ambulatory Visit: Payer: Self-pay

## 2019-10-26 ENCOUNTER — Emergency Department (HOSPITAL_COMMUNITY)
Admission: EM | Admit: 2019-10-26 | Discharge: 2019-10-26 | Disposition: A | Discharge status: Home / Self Care | Payer: Self-pay | Attending: Emergency Medicine | Admitting: Emergency Medicine

## 2019-10-26 ENCOUNTER — Encounter (HOSPITAL_COMMUNITY): Payer: Self-pay

## 2019-10-26 DIAGNOSIS — F17210 Nicotine dependence, cigarettes, uncomplicated: Secondary | ICD-10-CM | POA: Insufficient documentation

## 2019-10-26 DIAGNOSIS — F1010 Alcohol abuse, uncomplicated: Secondary | ICD-10-CM

## 2019-10-26 DIAGNOSIS — F1210 Cannabis abuse, uncomplicated: Secondary | ICD-10-CM

## 2019-10-26 DIAGNOSIS — F1410 Cocaine abuse, uncomplicated: Secondary | ICD-10-CM

## 2019-10-26 DIAGNOSIS — I10 Essential (primary) hypertension: Secondary | ICD-10-CM | POA: Insufficient documentation

## 2019-10-26 DIAGNOSIS — Z0279 Encounter for issue of other medical certificate: Secondary | ICD-10-CM | POA: Insufficient documentation

## 2019-10-26 DIAGNOSIS — F1590 Other stimulant use, unspecified, uncomplicated: Secondary | ICD-10-CM | POA: Insufficient documentation

## 2019-10-26 LAB — CBC WITH DIFFERENTIAL/PLATELET
Abs Immature Granulocytes: 0.01 10*3/uL (ref 0.00–0.07)
Basophils Absolute: 0.1 10*3/uL (ref 0.0–0.1)
Basophils Relative: 2 %
Eosinophils Absolute: 0.1 10*3/uL (ref 0.0–0.5)
Eosinophils Relative: 2 %
HCT: 47.9 % (ref 39.0–52.0)
Hemoglobin: 16.2 g/dL (ref 13.0–17.0)
Immature Granulocytes: 0 %
Lymphocytes Relative: 58 %
Lymphs Abs: 2.8 10*3/uL (ref 0.7–4.0)
MCH: 32 pg (ref 26.0–34.0)
MCHC: 33.8 g/dL (ref 30.0–36.0)
MCV: 94.7 fL (ref 80.0–100.0)
Monocytes Absolute: 0.4 10*3/uL (ref 0.1–1.0)
Monocytes Relative: 7 %
Neutro Abs: 1.5 10*3/uL — ABNORMAL LOW (ref 1.7–7.7)
Neutrophils Relative %: 31 %
Platelets: 214 10*3/uL (ref 150–400)
RBC: 5.06 MIL/uL (ref 4.22–5.81)
RDW: 14.7 % (ref 11.5–15.5)
WBC: 4.9 10*3/uL (ref 4.0–10.5)
nRBC: 0 % (ref 0.0–0.2)

## 2019-10-26 LAB — COMPREHENSIVE METABOLIC PANEL
ALT: 27 U/L (ref 0–44)
AST: 51 U/L — ABNORMAL HIGH (ref 15–41)
Albumin: 4.2 g/dL (ref 3.5–5.0)
Alkaline Phosphatase: 55 U/L (ref 38–126)
Anion gap: 12 (ref 5–15)
BUN: 7 mg/dL (ref 6–20)
CO2: 24 mmol/L (ref 22–32)
Calcium: 8.5 mg/dL — ABNORMAL LOW (ref 8.9–10.3)
Chloride: 107 mmol/L (ref 98–111)
Creatinine, Ser: 0.97 mg/dL (ref 0.61–1.24)
GFR calc Af Amer: >60 mL/min (ref >60)
GFR calc non Af Amer: >60 mL/min (ref >60)
Glucose, Bld: 97 mg/dL (ref 70–99)
Potassium: 3.6 mmol/L (ref 3.5–5.1)
Sodium: 143 mmol/L (ref 135–145)
Total Bilirubin: 0.9 mg/dL (ref 0.3–1.2)
Total Protein: 7.5 g/dL (ref 6.5–8.1)

## 2019-10-26 LAB — RAPID URINE DRUG SCREEN, HOSP PERFORMED
Amphetamines: NOT DETECTED
Barbiturates: NOT DETECTED
Benzodiazepines: NOT DETECTED
Cocaine: POSITIVE — AB
Opiates: NOT DETECTED
Tetrahydrocannabinol: POSITIVE — AB

## 2019-10-26 LAB — ETHANOL: Alcohol, Ethyl (B): 311 mg/dL (ref <10)

## 2019-10-26 NOTE — ED Triage Notes (Signed)
Pt says he is drinking to much and wants help to quit

## 2019-10-26 NOTE — ED Provider Notes (Signed)
Patient CARE signed out to reassess and discharge home this morning once clinically sober.  Reassessment patient alert oriented, no longer clinically intoxicated, tolerating oral liquids.  Discussed outpatient follow-up and to call for a ride home.  Kenton Kingfisher, MD 10/26/19 1110

## 2019-10-26 NOTE — Discharge Instructions (Signed)
Go to [facility] or look at the resource guide and find another facility to go to to help you with your alcohol and drug problem.

## 2019-10-26 NOTE — ED Notes (Signed)
Pt given ginger ale. Tolerated well.  

## 2019-10-26 NOTE — ED Notes (Signed)
Date and time results received: 10/26/19 4:22 AM(use smartphrase ".now" to insert current time)  Test: ETOH Critical Value: 311  Name of Provider Notified: Dr Lynelle Doctor  Orders Received? Or Actions Taken?: see chart

## 2019-10-26 NOTE — ED Provider Notes (Signed)
[facility] EMERGENCY DEPARTMENT Provider Note   CSN: [id number] Arrival date & time: 10/26/19  0103   Time seen 2:45 AM  History Chief Complaint  Patient presents with  . Medical Clearance   Level 5 caveat for intoxication  Paul Lambert is a 44 y.o. male.  HPI History is very difficult to obtain because patient falls asleep in midsentence and mumbles softly so I cannot understanding.  He tells me he is drinking too much.  He has difficulty tell me what he is drinking and how much he is drinking.  He states he drinks "as much as I can get".  He states he drinks beer but he cannot tell me how much.  He states he has been to detox before but has been a long time ago.  Other than that patient cannot give much information.  PCP Patient, No Pcp Per     Past Medical History:  Diagnosis Date  . Hypertension   . Seizures [facility])     Patient Active Problem List   Diagnosis Date Noted  . AKI (acute kidney injury) (HCC) 11/20/2017  . Hypertension 11/20/2017  . Polycythemia 11/20/2017  . Alcohol intoxication with delirium (HCC) 11/20/2017  . Rhabdomyolysis 11/20/2017  . Seizures (HCC) 11/20/2017  . Hypokalemia 11/20/2017  . Alcohol dependence (HCC) 11/20/2017  . Cocaine use 11/20/2017  . Alcoholic ketoacidosis 11/20/2017    Past Surgical History:  Procedure Laterality Date  . FACIAL COSMETIC SURGERY         Family History  Problem Relation Age of Onset  . Hypertension Mother   . Hypertension Father     Social History   Tobacco Use  . Smoking status: Current Every Day Smoker    Packs/day: 0.50    Types: Cigarettes  . Smokeless tobacco: Never Used  Vaping Use  . Vaping Use: Never used  Substance Use Topics  . Alcohol use: Yes    Alcohol/week: 40.0 - 52.0 standard drinks    Types: 24 - 36 Cans of beer, 16 Shots of liquor per week    Comment: "whatever I can"  . Drug use: Yes    Types: Cocaine, Marijuana    Home Medications Prior to Admission medications     Medication Sig Start Date End Date Taking? Authorizing Provider  famotidine (PEPCID) 20 MG tablet Take 1 tablet (20 mg total) by mouth 2 (two) times daily. 04/27/19   Bethann Berkshire, MD  ondansetron (ZOFRAN ODT) 4 MG disintegrating tablet 4mg  ODT q4 hours prn nausea/vomit 04/27/19   04/29/19, MD    Allergies    Onion  Review of Systems   Review of Systems  Unable to perform ROS: Other    Physical Exam Updated Vital Signs BP 121/78 (BP Location: Right Arm)   Pulse 86   Temp 97.9 F (36.6 C) (Oral)   Resp 19   Ht 5\' 9"  (1.753 m)   Wt 77.1 kg   SpO2 100%   BMI 25.10 kg/m   Physical Exam Vitals and nursing note reviewed.  Constitutional:      Appearance: Normal appearance. He is normal weight.     Comments: Sleeping, hard to keep awake  HENT:     Head: Normocephalic and atraumatic.     Right Ear: External ear normal.     Left Ear: External ear normal.  Eyes:     Extraocular Movements: Extraocular movements intact.     Conjunctiva/sclera:     Right eye: Right conjunctiva is injected.  Left eye: Left conjunctiva is injected.  Cardiovascular:     Rate and Rhythm: Normal rate and regular rhythm.     Pulses: Normal pulses.     Heart sounds: Normal heart sounds. No murmur heard.   Pulmonary:     Effort: Pulmonary effort is normal. No respiratory distress.     Breath sounds: Normal breath sounds.  Musculoskeletal:        General: Normal range of motion.     Cervical back: Normal range of motion.  Skin:    General: Skin is warm and dry.  Neurological:     Mental Status: He is alert.     Cranial Nerves: No cranial nerve deficit.  Psychiatric:        Mood and Affect: Affect is flat.        Speech: Speech is delayed and slurred.        Behavior: Behavior is slowed.     ED Results / Procedures / Treatments   Labs (all labs ordered are listed, but only abnormal results are displayed) Results for orders placed or performed during the hospital encounter of  10/26/19  Comprehensive metabolic panel  Result Value Ref Range   Sodium 143 135 - 145 mmol/L   Potassium 3.6 3.5 - 5.1 mmol/L   Chloride 107 98 - 111 mmol/L   CO2 24 22 - 32 mmol/L   Glucose, Bld 97 70 - 99 mg/dL   BUN 7 6 - 20 mg/dL   Creatinine, Ser 9.35 0.61 - 1.24 mg/dL   Calcium 8.5 (L) 8.9 - 10.3 mg/dL   Total Protein 7.5 6.5 - 8.1 g/dL   Albumin 4.2 3.5 - 5.0 g/dL   AST 51 (H) 15 - 41 U/L   ALT 27 0 - 44 U/L   Alkaline Phosphatase 55 38 - 126 U/L   Total Bilirubin 0.9 0.3 - 1.2 mg/dL   GFR calc non Af Amer >60 >60 mL/min   GFR calc Af Amer >60 >60 mL/min   Anion gap 12 5 - 15  Ethanol  Result Value Ref Range   Alcohol, Ethyl (B) 311 (HH) <10 mg/dL  CBC with Differential  Result Value Ref Range   WBC 4.9 4.0 - 10.5 K/uL   RBC 5.06 4.22 - 5.81 MIL/uL   Hemoglobin 16.2 13.0 - 17.0 g/dL   HCT 70.1 39 - 52 %   MCV 94.7 80.0 - 100.0 fL   MCH 32.0 26.0 - 34.0 pg   MCHC 33.8 30.0 - 36.0 g/dL   RDW 77.9 39.0 - 30.0 %   Platelets 214 150 - 400 K/uL   nRBC 0.0 0.0 - 0.2 %   Neutrophils Relative % 31 %   Neutro Abs 1.5 (L) 1.7 - 7.7 K/uL   Lymphocytes Relative 58 %   Lymphs Abs 2.8 0.7 - 4.0 K/uL   Monocytes Relative 7 %   Monocytes Absolute 0.4 0 - 1 K/uL   Eosinophils Relative 2 %   Eosinophils Absolute 0.1 0 - 0 K/uL   Basophils Relative 2 %   Basophils Absolute 0.1 0 - 0 K/uL   Immature Granulocytes 0 %   Abs Immature Granulocytes 0.01 0.00 - 0.07 K/uL  Urine rapid drug screen (hosp performed)  Result Value Ref Range   Opiates NONE DETECTED NONE DETECTED   Cocaine POSITIVE (A) NONE DETECTED   Benzodiazepines NONE DETECTED NONE DETECTED   Amphetamines NONE DETECTED NONE DETECTED   Tetrahydrocannabinol POSITIVE (A) NONE DETECTED   Barbiturates NONE DETECTED  NONE DETECTED   Laboratory interpretation all normal except alcohol intoxication, positive UDS for cocaine and marijuana, minor elevation of SGOT consistent with alcohol abuse   EKG None  Radiology No  results found.  Procedures Procedures (including critical care time)  Medications Ordered in ED Medications - No data to display  ED Course  I have reviewed the triage vital signs and the nursing notes.  Pertinent labs & imaging results that were available during my care of the patient were reviewed by me and considered in my medical decision making (see chart for details).    MDM Rules/Calculators/A&P                          Patient appears to be too intoxicated to participate in his care.  There is no signs of trauma to his head to suggest head injury.  His pupils are equal.  His conjunctiva is very injected I suspect he is probably been smoking marijuana 2.  Laboratory testing was done, patient will be reassessed once he is more sober.  Recheck at 6:30 AM patient still sleeping.  He is still hard to awaken.  When I asked him if he still wanted to go to rehab or detox he states "it does not matter".  He denies being suicidal, homicidal or having depression.  I suspect patient will stay in the ED a little bit longer and sober up and be discharged home.  At this point he still unable to cooperate for TTS consult and I am not sure he needs 1 at all.  Dr. Jodi Mourning made aware of patient at change of shift.  Final Clinical Impression(s) / ED Diagnoses Final diagnoses:  Alcohol abuse  Cocaine abuse (HCC)  Marijuana abuse    Rx / DC Orders  Disposition pending  Devoria Albe, MD, FACEP Thank you   Devoria Albe, MD 10/26/19 [phone]

## 2019-11-04 ENCOUNTER — Encounter (HOSPITAL_COMMUNITY): Payer: Self-pay | Admitting: Emergency Medicine

## 2019-11-04 ENCOUNTER — Emergency Department (HOSPITAL_COMMUNITY)
Admission: EM | Admit: 2019-11-04 | Discharge: 2019-11-04 | Disposition: A | Discharge status: Home / Self Care | Payer: Self-pay | Attending: Emergency Medicine | Admitting: Emergency Medicine

## 2019-11-04 ENCOUNTER — Other Ambulatory Visit: Payer: Self-pay

## 2019-11-04 ENCOUNTER — Emergency Department (HOSPITAL_COMMUNITY): Payer: Self-pay

## 2019-11-04 DIAGNOSIS — S01311A Laceration without foreign body of right ear, initial encounter: Secondary | ICD-10-CM | POA: Insufficient documentation

## 2019-11-04 DIAGNOSIS — F17210 Nicotine dependence, cigarettes, uncomplicated: Secondary | ICD-10-CM | POA: Insufficient documentation

## 2019-11-04 DIAGNOSIS — W1830XA Fall on same level, unspecified, initial encounter: Secondary | ICD-10-CM | POA: Insufficient documentation

## 2019-11-04 DIAGNOSIS — I10 Essential (primary) hypertension: Secondary | ICD-10-CM | POA: Insufficient documentation

## 2019-11-04 DIAGNOSIS — S0101XA Laceration without foreign body of scalp, initial encounter: Secondary | ICD-10-CM | POA: Insufficient documentation

## 2019-11-04 DIAGNOSIS — Y9301 Activity, walking, marching and hiking: Secondary | ICD-10-CM | POA: Insufficient documentation

## 2019-11-04 MED ORDER — LIDOCAINE HCL (PF) 2 % IJ SOLN: 10.0000 mL | Freq: Once | INTRAMUSCULAR | Status: AC

## 2019-11-04 MED ORDER — LIDOCAINE HCL (PF) 2 % IJ SOLN
INTRAMUSCULAR | Status: AC
  Administered 2019-11-04: 10 mL
  Filled 2019-11-04: qty 20
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
## 2019-11-04 NOTE — Discharge Instructions (Addendum)
The stitches that I put in tonight should dissolve, they do not need to be removed.

## 2019-11-04 NOTE — ED Notes (Signed)
Patient transported to CT 

## 2019-11-04 NOTE — ED Provider Notes (Signed)
[facility] EMERGENCY DEPARTMENT Provider Note   CSN: [id number] Arrival date & time: 11/04/19  0129     History Chief Complaint  Patient presents with  . Laceration    Paul Lambert is a 44 y.o. male.  Patient presents to the emergency department for evaluation after a fall.  Patient reports that he [place] train tracks, stumbled and fell to the ground.  He remembers the fall.  No seizure.  Patient reports headache.  He admits to drinking alcohol earlier tonight.        Past Medical History:  Diagnosis Date  . Hypertension   . Seizures [facility])     Patient Active Problem List   Diagnosis Date Noted  . AKI (acute kidney injury) (HCC) 11/20/2017  . Hypertension 11/20/2017  . Polycythemia 11/20/2017  . Alcohol intoxication with delirium (HCC) 11/20/2017  . Rhabdomyolysis 11/20/2017  . Seizures (HCC) 11/20/2017  . Hypokalemia 11/20/2017  . Alcohol dependence (HCC) 11/20/2017  . Cocaine use 11/20/2017  . Alcoholic ketoacidosis 11/20/2017    Past Surgical History:  Procedure Laterality Date  . FACIAL COSMETIC SURGERY         Family History  Problem Relation Age of Onset  . Hypertension Mother   . Hypertension Father     Social History   Tobacco Use  . Smoking status: Current Every Day Smoker    Packs/day: 0.50    Types: Cigarettes  . Smokeless tobacco: Never Used  Vaping Use  . Vaping Use: Never used  Substance Use Topics  . Alcohol use: Yes    Alcohol/week: 40.0 - 52.0 standard drinks    Types: 24 - 36 Cans of beer, 16 Shots of liquor per week    Comment: "whatever I can"  . Drug use: Yes    Types: Cocaine, Marijuana    Home Medications Prior to Admission medications   Medication Sig Start Date End Date Taking? Authorizing Provider  famotidine (PEPCID) 20 MG tablet Take 1 tablet (20 mg total) by mouth 2 (two) times daily. 04/27/19   Bethann Berkshire, MD  ondansetron (ZOFRAN ODT) 4 MG disintegrating tablet 4mg  ODT q4 hours prn nausea/vomit  04/27/19   04/29/19, MD    Allergies    Onion  Review of Systems   Review of Systems  Skin: Positive for wound.  Neurological: Positive for headaches.  All other systems reviewed and are negative.   Physical Exam Updated Vital Signs BP (!) 144/105   Pulse 94   Ht 5\' 9"  (1.753 m)   Wt 77 kg   SpO2 96%   BMI 25.07 kg/m   Physical Exam Vitals and nursing note reviewed.  Constitutional:      General: He is not in acute distress.    Appearance: Normal appearance. He is well-developed.  HENT:     Head: Normocephalic and atraumatic.     Comments: Abrasion frontal scalp, 2 abrasions parietal scalp  Abrasion right forehead  Laceration right parietal scalp, laceration right preauricular area    Right Ear: Hearing normal.     Left Ear: Hearing normal.     Nose: Nose normal.  Eyes:     Conjunctiva/sclera: Conjunctivae normal.     Pupils: Pupils are equal, round, and reactive to light.  Cardiovascular:     Rate and Rhythm: Regular rhythm.     Heart sounds: S1 normal and S2 normal. No murmur heard.  No friction rub. No gallop.   Pulmonary:     Effort: Pulmonary effort is  normal. No respiratory distress.     Breath sounds: Normal breath sounds.  Chest:     Chest wall: No tenderness.  Abdominal:     General: Bowel sounds are normal.     Palpations: Abdomen is soft.     Tenderness: There is no abdominal tenderness. There is no guarding or rebound. Negative signs include Murphy's sign and McBurney's sign.     Hernia: No hernia is present.  Musculoskeletal:        General: Normal range of motion.     Cervical back: Normal range of motion and neck supple.  Skin:    General: Skin is warm and dry.     Findings: Laceration present. No rash.  Neurological:     Mental Status: He is alert and oriented to person, place, and time.     GCS: GCS eye subscore is 4. GCS verbal subscore is 5. GCS motor subscore is 6.     Cranial Nerves: No cranial nerve deficit.     Sensory: No  sensory deficit.     Coordination: Coordination normal.  Psychiatric:        Speech: Speech normal.        Behavior: Behavior normal.        Thought Content: Thought content normal.     ED Results / Procedures / Treatments   Labs (all labs ordered are listed, but only abnormal results are displayed) Labs Reviewed - No data to display  EKG None  Radiology No results found.  Procedures .Marland KitchenLaceration Repair  Date/Time: 11/04/2019 2:51 AM Performed by: Gilda Crease, MD Authorized by: Gilda Crease, MD   Consent:    Consent obtained:  Verbal   Consent given by:  Patient   Risks discussed:  Infection, pain, poor cosmetic result and retained foreign body Universal protocol:    Procedure explained and questions answered to patient or proxy's satisfaction: yes     Relevant documents present and verified: yes     Test results available and properly labeled: yes     Imaging studies available: yes     Required blood products, implants, devices, and special equipment available: yes     Site/side marked: yes     Immediately prior to procedure, a time out was called: yes     Patient identity confirmed:  Verbally with patient Anesthesia (see MAR for exact dosages):    Anesthesia method:  Local infiltration   Local anesthetic:  Lidocaine 2% w/o epi Laceration details:    Location:  Scalp   Scalp location:  R parietal   Length (cm):  2.5 Repair type:    Repair type:  Simple Pre-procedure details:    Preparation:  Patient was prepped and draped in usual sterile fashion and imaging obtained to evaluate for foreign bodies Exploration:    Hemostasis achieved with:  Direct pressure   Contaminated: no   Treatment:    Area cleansed with:  Hibiclens   Irrigation solution:  Sterile saline   Irrigation method:  Syringe Skin repair:    Repair method:  Sutures   Suture size:  5-0   Suture material:  Fast-absorbing gut   Number of sutures:  5 Approximation:     Approximation:  Close Post-procedure details:    Dressing:  Open (no dressing)   Patient tolerance of procedure:  Tolerated well, no immediate complications .Marland KitchenLaceration Repair  Date/Time: 11/04/2019 2:52 AM Performed by: Gilda Crease, MD Authorized by: Gilda Crease, MD   Consent:  Consent obtained:  Verbal   Consent given by:  Patient   Risks discussed:  Infection, pain, retained foreign body and poor cosmetic result Universal protocol:    Procedure explained and questions answered to patient or proxy's satisfaction: yes     Relevant documents present and verified: yes     Test results available and properly labeled: yes     Imaging studies available: yes     Required blood products, implants, devices, and special equipment available: yes     Site/side marked: yes     Immediately prior to procedure, a time out was called: yes     Patient identity confirmed:  Verbally with patient Laceration details:    Location:  Ear   Ear location:  R ear   Length (cm):  1.5 Repair type:    Repair type:  Simple Pre-procedure details:    Preparation:  Imaging obtained to evaluate for foreign bodies and patient was prepped and draped in usual sterile fashion Exploration:    Hemostasis achieved with:  Direct pressure   Contaminated: no   Treatment:    Area cleansed with:  Betadine   Irrigation solution:  Sterile saline   Irrigation method:  Syringe Skin repair:    Repair method:  Sutures   Suture size:  5-0   Suture material:  Fast-absorbing gut   Number of sutures:  3 Approximation:    Approximation:  Close Post-procedure details:    Dressing:  Open (no dressing)   Patient tolerance of procedure:  Tolerated well, no immediate complications   (including critical care time)  Medications Ordered in ED Medications  lidocaine HCl (PF) (XYLOCAINE) 2 % injection 10 mL (10 mLs Infiltration Given by Other 11/04/19 6789)    ED Course  I have reviewed the triage vital  signs and the nursing notes.  Pertinent labs & imaging results that were available during my care of the patient were reviewed by me and considered in my medical decision making (see chart for details).    MDM Rules/Calculators/A&P                          Patient presents with multiple wounds on his forehead and scalp after falling earlier tonight.  Most of the wounds were abrasions and did not require repair.  Patient had a laceration on the right ear/preauricular area as well as right parietal scalp that were repaired with sutures.  Patient has been drinking alcohol, therefore underwent CT head and cervical spine which did not show any significant injury.  Final Clinical Impression(s) / ED Diagnoses Final diagnoses:  Scalp laceration, initial encounter    Rx / DC Orders ED Discharge Orders    None       Bienvenido Proehl, Canary Brim, MD 11/04/19 [phone]

## 2019-11-30 ENCOUNTER — Other Ambulatory Visit: Payer: Self-pay

## 2019-11-30 ENCOUNTER — Emergency Department (HOSPITAL_COMMUNITY)
Admission: EM | Admit: 2019-11-30 | Discharge: 2019-11-30 | Disposition: A | Discharge status: Home / Self Care | Payer: Self-pay | Attending: Emergency Medicine | Admitting: Emergency Medicine

## 2019-11-30 ENCOUNTER — Encounter (HOSPITAL_COMMUNITY): Payer: Self-pay | Admitting: Emergency Medicine

## 2019-11-30 DIAGNOSIS — F1490 Cocaine use, unspecified, uncomplicated: Secondary | ICD-10-CM | POA: Insufficient documentation

## 2019-11-30 DIAGNOSIS — F1299 Cannabis use, unspecified with unspecified cannabis-induced disorder: Secondary | ICD-10-CM | POA: Insufficient documentation

## 2019-11-30 DIAGNOSIS — I10 Essential (primary) hypertension: Secondary | ICD-10-CM | POA: Insufficient documentation

## 2019-11-30 DIAGNOSIS — F10920 Alcohol use, unspecified with intoxication, uncomplicated: Secondary | ICD-10-CM

## 2019-11-30 DIAGNOSIS — F10129 Alcohol abuse with intoxication, unspecified: Secondary | ICD-10-CM | POA: Insufficient documentation

## 2019-11-30 DIAGNOSIS — F17210 Nicotine dependence, cigarettes, uncomplicated: Secondary | ICD-10-CM | POA: Insufficient documentation

## 2019-11-30 LAB — CBG MONITORING, ED: Glucose-Capillary: 112 mg/dL — ABNORMAL HIGH (ref 70–99)
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
## 2019-11-30 NOTE — ED Provider Notes (Signed)
[facility] EMERGENCY DEPARTMENT Provider Note   CSN: [id number] Arrival date & time: 11/30/19  0140     History Chief Complaint  Patient presents with  . Multiple Complaints    Paul Lambert is a 44 y.o. male.  Patient presents to the emergency department with concerns over possible high blood sugar.  He admits to drinking alcohol tonight.  He thinks his blood pressure might be high as well, wanted to get checked out.        Past Medical History:  Diagnosis Date  . Hypertension   . Seizures [facility])     Patient Active Problem List   Diagnosis Date Noted  . AKI (acute kidney injury) (HCC) 11/20/2017  . Hypertension 11/20/2017  . Polycythemia 11/20/2017  . Alcohol intoxication with delirium (HCC) 11/20/2017  . Rhabdomyolysis 11/20/2017  . Seizures (HCC) 11/20/2017  . Hypokalemia 11/20/2017  . Alcohol dependence (HCC) 11/20/2017  . Cocaine use 11/20/2017  . Alcoholic ketoacidosis 11/20/2017    Past Surgical History:  Procedure Laterality Date  . FACIAL COSMETIC SURGERY         Family History  Problem Relation Age of Onset  . Hypertension Mother   . Hypertension Father     Social History   Tobacco Use  . Smoking status: Current Every Day Smoker    Packs/day: 0.50    Types: Cigarettes  . Smokeless tobacco: Never Used  Vaping Use  . Vaping Use: Never used  Substance Use Topics  . Alcohol use: Yes    Alcohol/week: 40.0 - 52.0 standard drinks    Types: 24 - 36 Cans of beer, 16 Shots of liquor per week    Comment: "whatever I can"  . Drug use: Yes    Types: Cocaine, Marijuana    Home Medications Prior to Admission medications   Medication Sig Start Date End Date Taking? Authorizing Provider  famotidine (PEPCID) 20 MG tablet Take 1 tablet (20 mg total) by mouth 2 (two) times daily. 04/27/19   Bethann Berkshire, MD  ondansetron (ZOFRAN ODT) 4 MG disintegrating tablet 4mg  ODT q4 hours prn nausea/vomit 04/27/19   04/29/19, MD    Allergies     Onion  Review of Systems   Review of Systems  Respiratory: Positive for shortness of breath.   Cardiovascular: Positive for chest pain.  All other systems reviewed and are negative.   Physical Exam Updated Vital Signs BP (!) 139/102 (BP Location: Right Arm)   Pulse (!) 107   Temp 97.9 F (36.6 C) (Temporal)   Resp 18   Ht 5\' 9"  (1.753 m)   Wt 77 kg   SpO2 96%   BMI 25.07 kg/m   Physical Exam Vitals and nursing note reviewed.  Constitutional:      General: He is not in acute distress.    Appearance: Normal appearance. He is well-developed.  HENT:     Head: Normocephalic and atraumatic.     Right Ear: Hearing normal.     Left Ear: Hearing normal.     Nose: Nose normal.  Eyes:     Conjunctiva/sclera: Conjunctivae normal.     Pupils: Pupils are equal, round, and reactive to light.  Cardiovascular:     Rate and Rhythm: Regular rhythm.     Heart sounds: S1 normal and S2 normal. No murmur heard.  No friction rub. No gallop.   Pulmonary:     Effort: Pulmonary effort is normal. No respiratory distress.     Breath sounds: Normal breath sounds.  Chest:     Chest wall: No tenderness.  Abdominal:     General: Bowel sounds are normal.     Palpations: Abdomen is soft.     Tenderness: There is no abdominal tenderness. There is no guarding or rebound. Negative signs include Murphy's sign and McBurney's sign.     Hernia: No hernia is present.  Musculoskeletal:        General: Normal range of motion.     Cervical back: Normal range of motion and neck supple.  Skin:    General: Skin is warm and dry.     Findings: No rash.  Neurological:     Mental Status: He is alert and oriented to person, place, and time.     GCS: GCS eye subscore is 4. GCS verbal subscore is 5. GCS motor subscore is 6.     Cranial Nerves: No cranial nerve deficit.     Sensory: No sensory deficit.     Coordination: Coordination normal.     Comments: Intoxicated, no focal deficits  Psychiatric:         Speech: Speech is slurred.        Thought Content: Thought content normal.     ED Results / Procedures / Treatments   Labs (all labs ordered are listed, but only abnormal results are displayed) Labs Reviewed  CBG MONITORING, ED - Abnormal; Notable for the following components:      Result Value   Glucose-Capillary 112 (*)    All other components within normal limits    EKG None  Radiology No results found.  Procedures Procedures (including critical care time)  Medications Ordered in ED Medications - No data to display  ED Course  I have reviewed the triage vital signs and the nursing notes.  Pertinent labs & imaging results that were available during my care of the patient were reviewed by me and considered in my medical decision making (see chart for details).    MDM Rules/Calculators/A&P                          Patient presents with acute alcohol intoxication.  He is in no distress.  Vital signs are normal.  Blood sugar is normal.  Examination is unremarkable other than signs of intoxication.  Will allow to sleep until morning, discharge when sober.  Final Clinical Impression(s) / ED Diagnoses Final diagnoses:  Alcoholic intoxication without complication [facility])    Rx / DC Orders ED Discharge Orders    None       Tamarion Haymond, Canary Brim, MD 11/30/19 [phone]

## 2020-02-14 ENCOUNTER — Encounter (HOSPITAL_COMMUNITY): Payer: Self-pay

## 2020-02-14 ENCOUNTER — Emergency Department (HOSPITAL_COMMUNITY)
Admission: EM | Admit: 2020-02-14 | Discharge: 2020-02-14 | Disposition: A | Discharge status: Home / Self Care | Payer: Self-pay | Attending: Emergency Medicine | Admitting: Emergency Medicine

## 2020-02-14 ENCOUNTER — Other Ambulatory Visit: Payer: Self-pay

## 2020-02-14 DIAGNOSIS — F17210 Nicotine dependence, cigarettes, uncomplicated: Secondary | ICD-10-CM | POA: Insufficient documentation

## 2020-02-14 DIAGNOSIS — K047 Periapical abscess without sinus: Secondary | ICD-10-CM | POA: Insufficient documentation

## 2020-02-14 DIAGNOSIS — I10 Essential (primary) hypertension: Secondary | ICD-10-CM | POA: Insufficient documentation

## 2020-02-14 MED ORDER — AMOXICILLIN 500 MG PO CAPS: 500.0000 mg | ORAL_CAPSULE | Freq: Three times a day (TID) | ORAL | 0 refills | Status: AC

## 2020-02-14 MED ORDER — IBUPROFEN 800 MG PO TABS
800.0000 mg | ORAL_TABLET | Freq: Once | ORAL | Status: AC
  Administered 2020-02-14: 800 mg via ORAL
  Filled 2020-02-14: qty 1

## 2020-02-14 MED ORDER — IBUPROFEN 800 MG PO TABS: 800.0000 mg | ORAL_TABLET | Freq: Three times a day (TID) | ORAL | 0 refills | Status: DC

## 2020-02-14 MED ORDER — AMOXICILLIN 250 MG PO CAPS
500.0000 mg | ORAL_CAPSULE | Freq: Once | ORAL | Status: AC
  Administered 2020-02-14: 500 mg via ORAL
  Filled 2020-02-14: qty 2
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
## 2020-02-14 NOTE — Discharge Instructions (Addendum)
You do have a swollen lymph node which I suspect is related to a dental infection surrounding your left lower 3rd molar tooth.  Complete the entire course of the antibiotics prescribed.  Plan to see a dentist for followup care - see the resource list.  Dr. Pernell Dupre (and other dentists I am sure) will see you without insurance, but will bill you for visiting them.  In the interim, return here if you develop any new or worsened symptoms.
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
## 2020-04-05 ENCOUNTER — Emergency Department (HOSPITAL_COMMUNITY): Payer: Self-pay

## 2020-04-05 ENCOUNTER — Other Ambulatory Visit: Payer: Self-pay

## 2020-04-05 ENCOUNTER — Encounter (HOSPITAL_COMMUNITY): Payer: Self-pay | Admitting: Emergency Medicine

## 2020-04-05 ENCOUNTER — Emergency Department (HOSPITAL_COMMUNITY)
Admission: EM | Admit: 2020-04-05 | Discharge: 2020-04-05 | Disposition: A | Discharge status: Home / Self Care | Payer: Self-pay | Attending: Emergency Medicine | Admitting: Emergency Medicine

## 2020-04-05 DIAGNOSIS — F1010 Alcohol abuse, uncomplicated: Secondary | ICD-10-CM | POA: Insufficient documentation

## 2020-04-05 DIAGNOSIS — I10 Essential (primary) hypertension: Secondary | ICD-10-CM

## 2020-04-05 DIAGNOSIS — Z5321 Procedure and treatment not carried out due to patient leaving prior to being seen by health care provider: Secondary | ICD-10-CM | POA: Insufficient documentation

## 2020-04-05 LAB — CBC WITH DIFFERENTIAL/PLATELET
Abs Immature Granulocytes: 0.01 10*3/uL (ref 0.00–0.07)
Basophils Absolute: 0.1 10*3/uL (ref 0.0–0.1)
Basophils Relative: 1 %
Eosinophils Absolute: 0.2 10*3/uL (ref 0.0–0.5)
Eosinophils Relative: 3 %
HCT: 42.9 % (ref 39.0–52.0)
Hemoglobin: 14.5 g/dL (ref 13.0–17.0)
Immature Granulocytes: 0 %
Lymphocytes Relative: 49 %
Lymphs Abs: 2.7 10*3/uL (ref 0.7–4.0)
MCH: 31.5 pg (ref 26.0–34.0)
MCHC: 33.8 g/dL (ref 30.0–36.0)
MCV: 93.1 fL (ref 80.0–100.0)
Monocytes Absolute: 0.5 10*3/uL (ref 0.1–1.0)
Monocytes Relative: 8 %
Neutro Abs: 2.2 10*3/uL (ref 1.7–7.7)
Neutrophils Relative %: 39 %
Platelets: 232 10*3/uL (ref 150–400)
RBC: 4.61 MIL/uL (ref 4.22–5.81)
RDW: 11.9 % (ref 11.5–15.5)
WBC: 5.6 10*3/uL (ref 4.0–10.5)
nRBC: 0 % (ref 0.0–0.2)

## 2020-04-05 LAB — COMPREHENSIVE METABOLIC PANEL
ALT: 36 U/L (ref 0–44)
AST: 36 U/L (ref 15–41)
Albumin: 4.3 g/dL (ref 3.5–5.0)
Alkaline Phosphatase: 49 U/L (ref 38–126)
Anion gap: 13 (ref 5–15)
BUN: 8 mg/dL (ref 6–20)
CO2: 21 mmol/L — ABNORMAL LOW (ref 22–32)
Calcium: 8.4 mg/dL — ABNORMAL LOW (ref 8.9–10.3)
Chloride: 104 mmol/L (ref 98–111)
Creatinine, Ser: 0.93 mg/dL (ref 0.61–1.24)
GFR, Estimated: >60 mL/min (ref >60)
Glucose, Bld: 101 mg/dL — ABNORMAL HIGH (ref 70–99)
Potassium: 3.7 mmol/L (ref 3.5–5.1)
Sodium: 138 mmol/L (ref 135–145)
Total Bilirubin: 0.7 mg/dL (ref 0.3–1.2)
Total Protein: 7.5 g/dL (ref 6.5–8.1)

## 2020-04-05 LAB — ETHANOL: Alcohol, Ethyl (B): 239 mg/dL — ABNORMAL HIGH (ref <10)

## 2020-04-05 MED ORDER — CHLORDIAZEPOXIDE HCL 25 MG PO CAPS: ORAL_CAPSULE | ORAL | 0 refills | Status: DC

## 2020-04-05 MED ORDER — ACETAMINOPHEN 325 MG PO TABS
650.0000 mg | ORAL_TABLET | Freq: Once | ORAL | Status: AC
  Administered 2020-04-05: 650 mg via ORAL
  Filled 2020-04-05: qty 2

## 2020-04-05 MED ORDER — LISINOPRIL 20 MG PO TABS: 40.0000 mg | ORAL_TABLET | Freq: Every day | ORAL | 3 refills | Status: DC

## 2020-04-05 NOTE — ED Notes (Signed)
Entered room and introduced self to patient. Pt appears to be resting in bed, respirations are even and unlabored with equal chest rise and fall. Bed is locked in the lowest position, side rails x2, call bell within reach.

## 2020-04-05 NOTE — ED Triage Notes (Signed)
Pt states he wants help with drinking alcohol.  Daily drinking of beer and liquor. States he drank "a lot" last night and this morning.

## 2020-04-05 NOTE — Discharge Instructions (Signed)
Do not drink any alcohol today and start taking the Librium tomorrow.  You can start taking the blood pressure medicine today.  Follow-up with [facility] for your alcohol abuse and follow-up with your family doctor for your blood pressure

## 2020-04-05 NOTE — ED Provider Notes (Signed)
[facility] EMERGENCY DEPARTMENT Provider Note   CSN: [id number] Arrival date & time: 04/05/20  9518     History Chief Complaint  Patient presents with  . Alcohol Problem    Paul Lambert is a 45 y.o. male.  Patient presents with high blood pressure and mild headache and EtOH abuse.  Patient states she fell a number days ago and has had a headache ever since then.  Patient wants help with EtOH abuse  The history is provided by the patient and medical records. No language interpreter was used.  Alcohol Problem This is a chronic problem. The current episode started more than 1 week ago. The problem occurs constantly. The problem has not changed since onset.Associated symptoms include headaches. Pertinent negatives include no chest pain and no abdominal pain. Nothing relieves the symptoms. He has tried nothing for the symptoms. The treatment provided no relief.       Past Medical History:  Diagnosis Date  . Hypertension   . Seizures [facility])     Patient Active Problem List   Diagnosis Date Noted  . AKI (acute kidney injury) (HCC) 11/20/2017  . Hypertension 11/20/2017  . Polycythemia 11/20/2017  . Alcohol intoxication with delirium (HCC) 11/20/2017  . Rhabdomyolysis 11/20/2017  . Seizures (HCC) 11/20/2017  . Hypokalemia 11/20/2017  . Alcohol dependence (HCC) 11/20/2017  . Cocaine use 11/20/2017  . Alcoholic ketoacidosis 11/20/2017    Past Surgical History:  Procedure Laterality Date  . FACIAL COSMETIC SURGERY         Family History  Problem Relation Age of Onset  . Hypertension Mother   . Hypertension Father     Social History   Tobacco Use  . Smoking status: Current Every Day Smoker    Packs/day: 0.50    Types: Cigarettes  . Smokeless tobacco: Never Used  Vaping Use  . Vaping Use: Never used  Substance Use Topics  . Alcohol use: Yes    Alcohol/week: 40.0 - 52.0 standard drinks    Types: 24 - 36 Cans of beer, 16 Shots of liquor per week    Comment:  "whatever I can"  . Drug use: Not Currently    Home Medications Prior to Admission medications   Medication Sig Start Date End Date Taking? Authorizing Provider  chlordiazePOXIDE (LIBRIUM) 25 MG capsule 50mg  PO TID x 1D, then 25-50mg  PO BID X 1D, then 25-50mg  PO QD X 1D 04/05/20  Yes 04/07/20, MD  lisinopril (ZESTRIL) 20 MG tablet Take 2 tablets (40 mg total) by mouth daily. 04/05/20  Yes 04/07/20, MD  famotidine (PEPCID) 20 MG tablet Take 1 tablet (20 mg total) by mouth 2 (two) times daily. 04/27/19   04/29/19, MD  ibuprofen (ADVIL) 800 MG tablet Take 1 tablet (800 mg total) by mouth 3 (three) times daily. 02/14/20   04/13/20, PA-C  ondansetron (ZOFRAN ODT) 4 MG disintegrating tablet 4mg  ODT q4 hours prn nausea/vomit 04/27/19   , MD    Allergies    Onion  Review of Systems   Review of Systems  Constitutional: Negative for appetite change and fatigue.  HENT: Negative for congestion, ear discharge and sinus pressure.   Eyes: Negative for discharge.  Respiratory: Negative for cough.   Cardiovascular: Negative for chest pain.  Gastrointestinal: Negative for abdominal pain and diarrhea.  Genitourinary: Negative for frequency and hematuria.  Musculoskeletal: Negative for back pain.  Skin: Negative for rash.  Neurological: Positive for headaches. Negative for seizures.  Psychiatric/Behavioral: Negative for hallucinations.  Physical Exam Updated Vital Signs BP (!) 150/104 (BP Location: Right Arm)   Pulse 98   Temp 98.5 F (36.9 C) (Oral)   Resp 16   Ht 5\' 7"  (1.702 m)   Wt 72.6 kg   SpO2 99%   BMI 25.06 kg/m   Physical Exam Vitals and nursing note reviewed.  Constitutional:      Appearance: He is well-developed.  HENT:     Head: Normocephalic.     Nose: Nose normal.  Eyes:     General: No scleral icterus.    Extraocular Movements: EOM normal.     Conjunctiva/sclera: Conjunctivae normal.  Neck:     Thyroid: No thyromegaly.   Cardiovascular:     Rate and Rhythm: Normal rate and regular rhythm.     Heart sounds: No murmur heard. No friction rub. No gallop.   Pulmonary:     Breath sounds: No stridor. No wheezing or rales.  Chest:     Chest wall: No tenderness.  Abdominal:     General: There is no distension.     Tenderness: There is no abdominal tenderness. There is no rebound.  Musculoskeletal:        General: No edema. Normal range of motion.     Cervical back: Neck supple.  Lymphadenopathy:     Cervical: No cervical adenopathy.  Skin:    Findings: No erythema or rash.  Neurological:     Mental Status: He is alert and oriented to person, place, and time.     Motor: No abnormal muscle tone.     Coordination: Coordination normal.  Psychiatric:        Mood and Affect: Mood and affect normal.        Behavior: Behavior normal.     ED Results / Procedures / Treatments   Labs (all labs ordered are listed, but only abnormal results are displayed) Labs Reviewed  COMPREHENSIVE METABOLIC PANEL - Abnormal; Notable for the following components:      Result Value   CO2 21 (*)    Glucose, Bld 101 (*)    Calcium 8.4 (*)    All other components within normal limits  ETHANOL - Abnormal; Notable for the following components:   Alcohol, Ethyl (B) 239 (*)    All other components within normal limits  CBC WITH DIFFERENTIAL/PLATELET    EKG None  Radiology CT Head Wo Contrast  Result Date: 04/05/2020 CLINICAL DATA:  Right-sided headache and neck pain. Fall 6 months ago. EXAM: CT HEAD WITHOUT CONTRAST CT CERVICAL SPINE WITHOUT CONTRAST TECHNIQUE: Multidetector CT imaging of the head and cervical spine was performed following the standard protocol without intravenous contrast. Multiplanar CT image reconstructions of the cervical spine were also generated. COMPARISON:  11/04/2019 FINDINGS: CT HEAD FINDINGS Brain: Ventricles, cisterns and other CSF spaces are normal. There is no mass, mass effect, shift of midline  structures or acute hemorrhage. No evidence of acute infarction. Vascular: No hyperdense vessel or unexpected calcification. Skull: No acute fracture. Sinuses/Orbits: Orbits are normal symmetric. Paranasal sinuses are clear. Other: None. CT CERVICAL SPINE FINDINGS Alignment: Reversal of the normal cervical lordosis. No posttraumatic subluxation. Skull base and vertebrae: Mild spondylosis throughout the cervical spine to include uncovertebral joint spurring and facet arthropathy. Mild bilateral neural foraminal narrowing at the C5-6 level. No evidence of acute fracture. Soft tissues and spinal canal: No prevertebral fluid or swelling. No visible canal hematoma. Disc levels: Subtle disc space narrowing from the C3-4 level to the C5-6 level. Upper chest:  No acute findings. Other: None. IMPRESSION: 1. No acute brain injury. 2. No acute cervical spine injury. 3. Mild spondylosis of the cervical spine with mild disc disease from the C3-4 level to the C5-6 level. Mild bilateral neural foraminal narrowing at the C5-6 level. Electronically Signed   By: Elberta Fortis M.D.   On: 04/05/2020 08:45   CT Cervical Spine Wo Contrast  Result Date: 04/05/2020 CLINICAL DATA:  Right-sided headache and neck pain. Fall 6 months ago. EXAM: CT HEAD WITHOUT CONTRAST CT CERVICAL SPINE WITHOUT CONTRAST TECHNIQUE: Multidetector CT imaging of the head and cervical spine was performed following the standard protocol without intravenous contrast. Multiplanar CT image reconstructions of the cervical spine were also generated. COMPARISON:  11/04/2019 FINDINGS: CT HEAD FINDINGS Brain: Ventricles, cisterns and other CSF spaces are normal. There is no mass, mass effect, shift of midline structures or acute hemorrhage. No evidence of acute infarction. Vascular: No hyperdense vessel or unexpected calcification. Skull: No acute fracture. Sinuses/Orbits: Orbits are normal symmetric. Paranasal sinuses are clear. Other: None. CT CERVICAL SPINE FINDINGS  Alignment: Reversal of the normal cervical lordosis. No posttraumatic subluxation. Skull base and vertebrae: Mild spondylosis throughout the cervical spine to include uncovertebral joint spurring and facet arthropathy. Mild bilateral neural foraminal narrowing at the C5-6 level. No evidence of acute fracture. Soft tissues and spinal canal: No prevertebral fluid or swelling. No visible canal hematoma. Disc levels: Subtle disc space narrowing from the C3-4 level to the C5-6 level. Upper chest: No acute findings. Other: None. IMPRESSION: 1. No acute brain injury. 2. No acute cervical spine injury. 3. Mild spondylosis of the cervical spine with mild disc disease from the C3-4 level to the C5-6 level. Mild bilateral neural foraminal narrowing at the C5-6 level. Electronically Signed   By: Elberta Fortis M.D.   On: 04/05/2020 08:45    Procedures Procedures   Medications Ordered in ED Medications  acetaminophen (TYLENOL) tablet 650 mg (650 mg Oral Given 04/05/20 7341)    ED Course  I have reviewed the triage vital signs and the nursing notes.  Pertinent labs & imaging results that were available during my care of the patient were reviewed by me and considered in my medical decision making (see chart for details).    MDM Rules/Calculators/A&P                         Labs and x-rays unremarkable.  Patient with high blood pressure and EtOH abuse.  He wants to go to get some help for his alcoholism.  Patient given lisinopril for his blood pressure and Librium taper for his EtOH abuse.  Patient will not drink alcohol today and start taking his Librium tomorrow.  He is [facility] to [facility] Final Clinical Impression(s) / ED Diagnoses Final diagnoses:  Hypertension, unspecified type  ETOH abuse    Rx / DC Orders ED Discharge Orders         Ordered    lisinopril (ZESTRIL) 20 MG tablet  Daily        04/05/20 1058    chlordiazePOXIDE (LIBRIUM) 25 MG capsule        04/05/20 1058           Bethann Berkshire, MD 04/09/20 [phone]

## 2020-04-13 ENCOUNTER — Encounter (HOSPITAL_COMMUNITY): Payer: Self-pay

## 2020-04-13 ENCOUNTER — Emergency Department (HOSPITAL_COMMUNITY)
Admission: EM | Admit: 2020-04-13 | Discharge: 2020-04-14 | Disposition: A | Discharge status: Home / Self Care | Payer: Self-pay | Attending: Emergency Medicine | Admitting: Emergency Medicine

## 2020-04-13 ENCOUNTER — Other Ambulatory Visit: Payer: Self-pay

## 2020-04-13 DIAGNOSIS — R45851 Suicidal ideations: Secondary | ICD-10-CM

## 2020-04-13 DIAGNOSIS — F10920 Alcohol use, unspecified with intoxication, uncomplicated: Secondary | ICD-10-CM

## 2020-04-13 DIAGNOSIS — Z79899 Other long term (current) drug therapy: Secondary | ICD-10-CM | POA: Insufficient documentation

## 2020-04-13 DIAGNOSIS — I10 Essential (primary) hypertension: Secondary | ICD-10-CM | POA: Insufficient documentation

## 2020-04-13 DIAGNOSIS — F1024 Alcohol dependence with alcohol-induced mood disorder: Secondary | ICD-10-CM | POA: Insufficient documentation

## 2020-04-13 DIAGNOSIS — F17210 Nicotine dependence, cigarettes, uncomplicated: Secondary | ICD-10-CM | POA: Insufficient documentation

## 2020-04-13 LAB — CBC WITH DIFFERENTIAL/PLATELET
Abs Immature Granulocytes: 0.02 10*3/uL (ref 0.00–0.07)
Basophils Absolute: 0.1 10*3/uL (ref 0.0–0.1)
Basophils Relative: 2 %
Eosinophils Absolute: 0.2 10*3/uL (ref 0.0–0.5)
Eosinophils Relative: 3 %
HCT: 41.5 % (ref 39.0–52.0)
Hemoglobin: 14.2 g/dL (ref 13.0–17.0)
Immature Granulocytes: 0 %
Lymphocytes Relative: 45 %
Lymphs Abs: 2.9 10*3/uL (ref 0.7–4.0)
MCH: 32.1 pg (ref 26.0–34.0)
MCHC: 34.2 g/dL (ref 30.0–36.0)
MCV: 93.7 fL (ref 80.0–100.0)
Monocytes Absolute: 0.6 10*3/uL (ref 0.1–1.0)
Monocytes Relative: 9 %
Neutro Abs: 2.7 10*3/uL (ref 1.7–7.7)
Neutrophils Relative %: 41 %
Platelets: 325 10*3/uL (ref 150–400)
RBC: 4.43 MIL/uL (ref 4.22–5.81)
RDW: 12.4 % (ref 11.5–15.5)
WBC: 6.4 10*3/uL (ref 4.0–10.5)
nRBC: 0 % (ref 0.0–0.2)

## 2020-04-13 LAB — RAPID URINE DRUG SCREEN, HOSP PERFORMED
Amphetamines: NOT DETECTED
Barbiturates: NOT DETECTED
Benzodiazepines: NOT DETECTED
Cocaine: NOT DETECTED
Opiates: NOT DETECTED
Tetrahydrocannabinol: NOT DETECTED

## 2020-04-13 NOTE — ED Provider Notes (Signed)
[facility] EMERGENCY DEPARTMENT Provider Note   CSN: [id number] Arrival date & time: 04/13/20  2245     History Chief Complaint  Patient presents with  . Suicidal    Paul Lambert is a 45 y.o. male.  Patient with history of HTN, seizures, etoh abuse.  Presents today with suicidal ideation.  He tells me that he tried to "kill himself" yesterday.  He held a knife to his throat, but didn't cut himself.  His significant other requested law enforcement bring him here.  He does admit to drinking "one beer" this evening.  Reports suicidal behaviors in the past.  The history is provided by the patient.       Past Medical History:  Diagnosis Date  . Hypertension   . Seizures [facility])     Patient Active Problem List   Diagnosis Date Noted  . AKI (acute kidney injury) (HCC) 11/20/2017  . Hypertension 11/20/2017  . Polycythemia 11/20/2017  . Alcohol intoxication with delirium (HCC) 11/20/2017  . Rhabdomyolysis 11/20/2017  . Seizures (HCC) 11/20/2017  . Hypokalemia 11/20/2017  . Alcohol dependence (HCC) 11/20/2017  . Cocaine use 11/20/2017  . Alcoholic ketoacidosis 11/20/2017    Past Surgical History:  Procedure Laterality Date  . FACIAL COSMETIC SURGERY         Family History  Problem Relation Age of Onset  . Hypertension Mother   . Hypertension Father     Social History   Tobacco Use  . Smoking status: Current Every Day Smoker    Packs/day: 0.50    Types: Cigarettes  . Smokeless tobacco: Never Used  Vaping Use  . Vaping Use: Never used  Substance Use Topics  . Alcohol use: Yes    Alcohol/week: 40.0 - 52.0 standard drinks    Types: 24 - 36 Cans of beer, 16 Shots of liquor per week    Comment: "whatever I can"  . Drug use: Not Currently    Home Medications Prior to Admission medications   Medication Sig Start Date End Date Taking? Authorizing Provider  chlordiazePOXIDE (LIBRIUM) 25 MG capsule 50mg  PO TID x 1D, then 25-50mg  PO BID X 1D, then 25-50mg  PO QD X  1D 04/05/20   04/07/20, MD  famotidine (PEPCID) 20 MG tablet Take 1 tablet (20 mg total) by mouth 2 (two) times daily. 04/27/19   04/29/19, MD  ibuprofen (ADVIL) 800 MG tablet Take 1 tablet (800 mg total) by mouth 3 (three) times daily. 02/14/20   04/13/20, PA-C  lisinopril (ZESTRIL) 20 MG tablet Take 2 tablets (40 mg total) by mouth daily. 04/05/20   04/07/20, MD  ondansetron (ZOFRAN ODT) 4 MG disintegrating tablet 4mg  ODT q4 hours prn nausea/vomit 04/27/19   , MD    Allergies    Onion  Review of Systems   Review of Systems  All other systems reviewed and are negative.   Physical Exam Updated Vital Signs BP (!) 134/101 (BP Location: Left Arm)   Pulse 92   Temp 97.8 F (36.6 C)   Resp 16   Ht 5\' 9"  (1.753 m)   Wt 84.8 kg   SpO2 98%   BMI 27.62 kg/m   Physical Exam Vitals and nursing note reviewed.  Constitutional:      General: He is not in acute distress.    Appearance: He is well-developed and well-nourished. He is not diaphoretic.  HENT:     Head: Normocephalic and atraumatic.     Mouth/Throat:  Mouth: Oropharynx is clear and moist.  Cardiovascular:     Rate and Rhythm: Normal rate and regular rhythm.     Heart sounds: No murmur heard. No friction rub.  Pulmonary:     Effort: Pulmonary effort is normal. No respiratory distress.     Breath sounds: Normal breath sounds. No wheezing or rales.  Abdominal:     General: Bowel sounds are normal. There is no distension.     Palpations: Abdomen is soft.     Tenderness: There is no abdominal tenderness.  Musculoskeletal:        General: No edema. Normal range of motion.     Cervical back: Normal range of motion and neck supple.  Skin:    General: Skin is warm and dry.  Neurological:     Mental Status: He is alert and oriented to person, place, and time.     Coordination: Coordination normal.  Psychiatric:        Attention and Perception: He is inattentive.        Mood and Affect:  Affect is blunt.        Speech: Speech normal.        Behavior: Behavior is withdrawn.        Thought Content: Thought content includes suicidal ideation. Thought content does not include homicidal ideation. Thought content includes suicidal plan. Thought content does not include homicidal plan.        Judgment: Judgment is impulsive.     ED Results / Procedures / Treatments   Labs (all labs ordered are listed, but only abnormal results are displayed) Labs Reviewed  ETHANOL  BASIC METABOLIC PANEL  CBC WITH DIFFERENTIAL/PLATELET  RAPID URINE DRUG SCREEN, HOSP PERFORMED    EKG None  Radiology No results found.  Procedures Procedures   Medications Ordered in ED Medications - No data to display  ED Course  I have reviewed the triage vital signs and the nursing notes.  Pertinent labs & imaging results that were available during my care of the patient were reviewed by me and considered in my medical decision making (see chart for details).    MDM Rules/Calculators/A&P  Patient presenting here with suicidal ideation.  He admits to drinking "one beer" prior to coming here, however his blood alcohol has returned at 305.  Patient otherwise seems medically cleared for TTS evaluation.  Disposition pending TTS recommendations.  Final Clinical Impression(s) / ED Diagnoses Final diagnoses:  None    Rx / DC Orders ED Discharge Orders    None       Geoffery Lyons, MD 04/14/20 [phone]

## 2020-04-13 NOTE — ED Triage Notes (Signed)
Pt here for SI with [employer]. significant other called for police to bring him to hospital. Pt is voluntary. Sates he just has a lot going on.  no plan Says he drank 1 beer PTA

## 2020-04-13 NOTE — ED Notes (Addendum)
Pt changed into burgundy scrubs and wanded by security.  2 bags of belongings/meds labeled and secured in locker room (second shelf, left side).  Pt ambulated to [place] bed 8 without difficulty and appears to be in NAD at this time.  Sitter present at time of arrival to hall bed.

## 2020-04-14 LAB — BASIC METABOLIC PANEL
Anion gap: 12 (ref 5–15)
BUN: 6 mg/dL (ref 6–20)
CO2: 22 mmol/L (ref 22–32)
Calcium: 8.6 mg/dL — ABNORMAL LOW (ref 8.9–10.3)
Chloride: 106 mmol/L (ref 98–111)
Creatinine, Ser: 1 mg/dL (ref 0.61–1.24)
GFR, Estimated: >60 mL/min (ref >60)
Glucose, Bld: 88 mg/dL (ref 70–99)
Potassium: 3.6 mmol/L (ref 3.5–5.1)
Sodium: 140 mmol/L (ref 135–145)

## 2020-04-14 LAB — ETHANOL: Alcohol, Ethyl (B): 305 mg/dL (ref <10)

## 2020-04-14 MED ORDER — CHLORDIAZEPOXIDE HCL 25 MG PO CAPS
25.0000 mg | ORAL_CAPSULE | Freq: Once | ORAL | Status: AC
  Administered 2020-04-14: 25 mg via ORAL
  Filled 2020-04-14: qty 1

## 2020-04-14 MED ORDER — CHLORDIAZEPOXIDE HCL 25 MG PO CAPS: ORAL_CAPSULE | ORAL | 0 refills | Status: DC

## 2020-04-14 NOTE — ED Notes (Addendum)
Critical ETOH of .305  Dr. Judd Lien made aware.

## 2020-04-14 NOTE — Discharge Instructions (Addendum)
Avoid all forms of alcohol.  We sent a prescription to your pharmacy to treat symptoms of alcohol withdrawal.

## 2020-04-14 NOTE — BH Assessment (Addendum)
Comprehensive Clinical Assessment (CCA) Note  04/14/2020 JERICK KHACHATRYAN [id number]  Per Marciano Sequin, NP, patient is psychiatrically cleared and recommended for outpatient services to include substance abuse treatment. List of resources provided.    Paul Lambert is a 45 year old male presenting to [facility] voluntarily via [facility] due to his significant other stating that patient was making suicidal statements. Patient reports that he has a problem with drinking and when he drinks, he gets angry, agitated and fights. Patient reports making suicidal statements when he is intoxicated and hurts himself out of aggressive acts. Patient acknowledges making suicidal statements last night while intoxicated and reports having an argument with his significant other. Patient does not recall what the argument was about but states that he picks fights when he is intoxicated. Patient not sure how much he drunk last night but reports that on average he can drink about 10-12 40oz and some wine. Patient reports drinking "all day every day". Patient also reports smoking marijuana last night. Patient reports withdrawal symptoms of shaking, nauseas and sweating.   Patient reports stressors related to his alcohol use and not having a job. Patient denies having outpatient services currently and reports inpatient treatment in [place] a few years ago for alcohol use. Patient reports he would like to stay a few days. Pt denies current SI and contracts for safety. Patient denies current HI/VH/SIB but reports auditory hallucinations while sober of hearing someone talking behind him.    Patient consents for TTS to obtain collateral from his significant other [name] [phone]) who reports that patient is her ex-husband, and he is living with her and her son. [name] states that patient has an alcohol problem and last night they were arguing about patient not having a job and the situation they are in. [name] reports that patient made the statement of  wanting to kill himself and was holding a knife like he was going to cut himself. [name] reports that her son had to talk to patient so he would hurt himself. [name] reports that patient is always angry even when he is sober however when he is intoxicated, he is more aggressive and suicidal. [name] reports that patient can return home and she "kind of" have safety concerns when patient is intoxicated and aggressive.   The patient demonstrates the following risk factors for suicide: Chronic risk factors for suicide include: substance use disorder. Acute risk factors for suicide include: family or marital conflict and unemployment. Protective factors for this patient include: positive social support and hope for the future. Considering these factors, the overall suicide risk at this point appears to be low. Patient is appropriate for outpatient follow up.  Flowsheet Row ED from 04/13/2020 in [facility] EMERGENCY DEPARTMENT ED from 04/05/2020 in [facility] EMERGENCY DEPARTMENT ED from 04/11/2019 in [facility] EMERGENCY DEPARTMENT  C-SSRS RISK CATEGORY High Risk No Risk No Risk      Chief Complaint:  Chief Complaint  Patient presents with  . Suicidal   Visit Diagnosis: Alcohol-induced mood disorder    CCA Screening, Triage and Referral (STR)  Patient Reported Information How did you hear about [place]? No data recorded Referral name: No data recorded Referral phone number: No data recorded  Whom do you see for routine medical problems? No data recorded Practice/Facility Name: No data recorded Practice/Facility Phone Number: No data recorded Name of Contact: No data recorded Contact Number: No data recorded Contact Fax Number: No data recorded Prescriber Name: No data recorded Prescriber Address (if known): No data  recorded  What Is the Reason for Your Visit/Call Today? No data recorded How Long Has This Been Causing You Problems? No data recorded What Do You Feel Would Help You the Most Today? No  data recorded  Have You Recently Been in Any Inpatient Treatment (Hospital/Detox/Crisis Center/28-Day Program)? No data recorded Name/Location of Program/Hospital:No data recorded How Long Were You There? No data recorded When Were You Discharged? No data recorded  Have You Ever Received Services From [facility] Before? No data recorded Who Do You See at [facility]? No data recorded  Have You Recently Had Any Thoughts About Hurting Yourself? No data recorded Are You Planning to Commit Suicide/Harm Yourself At This time? No data recorded  Have you Recently Had Thoughts About Hurting Someone Karolee Ohs? No data recorded Explanation: No data recorded  Have You Used Any Alcohol or Drugs in the Past 24 Hours? No data recorded How Long Ago Did You Use Drugs or Alcohol? No data recorded What Did You Use and How Much? No data recorded  Do You Currently Have a Therapist/Psychiatrist? No data recorded Name of Therapist/Psychiatrist: No data recorded  Have You Been Recently Discharged From Any Office Practice or Programs? No data recorded Explanation of Discharge From Practice/Program: No data recorded    CCA Screening Triage Referral Assessment Type of Contact: No data recorded Is this Initial or Reassessment? No data recorded Date Telepsych consult ordered in [facility]:  No data recorded Time Telepsych consult ordered in [facility]:  No data recorded  Patient Reported Information Reviewed? No data recorded Patient Left Without Being Seen? No data recorded Reason for Not Completing Assessment: No data recorded  Collateral Involvement: No data recorded  Does Patient Have a Court Appointed Legal Guardian? No data recorded Name and Contact of Legal Guardian: No data recorded If Minor and Not Living with Parent(s), Who has Custody? No data recorded Is CPS involved or ever been involved? No data recorded Is APS involved or ever been involved? No data recorded  Patient Determined To Be At Risk for Harm  To Self or Others Based on Review of Patient Reported Information or Presenting Complaint? No data recorded Method: No data recorded Availability of Means: No data recorded Intent: No data recorded Notification Required: No data recorded Additional Information for Danger to Others Potential: No data recorded Additional Comments for Danger to Others Potential: No data recorded Are There Guns or Other Weapons in Your Home? No data recorded Types of Guns/Weapons: No data recorded Are These Weapons Safely Secured?                            No data recorded Who Could Verify You Are Able To Have These Secured: No data recorded Do You Have any Outstanding Charges, Pending Court Dates, Parole/Probation? No data recorded Contacted To Inform of Risk of Harm To Self or Others: No data recorded  Location of Assessment: No data recorded  Does Patient Present under Involuntary Commitment? No data recorded IVC Papers Initial File Date: No data recorded  [place] of Residence: No data recorded  Patient Currently Receiving the Following Services: No data recorded  Determination of Need: No data recorded  Options For Referral: No data recorded    CCA Biopsychosocial Intake/Chief Complaint:  Intoxication, SI  Current Symptoms/Problems: irritable   Patient Reported Schizophrenia/Schizoaffective Diagnosis in Past: No   Strengths: UTA  Preferences: UTA  Abilities: UTA   Type of Services Patient Feels are Needed: Substance  abuse treatment   Initial Clinical Notes/Concerns: No data recorded  Mental Health Symptoms Depression:  Irritability; Change in energy/activity   Duration of Depressive symptoms: Greater than two weeks   Mania:  None   Anxiety:   None   Psychosis:  None   Duration of Psychotic symptoms: No data recorded  Trauma:  None   Obsessions:  None   Compulsions:  None   Inattention:  None   Hyperactivity/Impulsivity:  N/A   Oppositional/Defiant Behaviors:   Angry; Aggression towards people/animals   Emotional Irregularity:  None   Other Mood/Personality Symptoms:  No data recorded   Mental Status Exam Appearance and self-care  Stature:  Average   Weight:  Average weight   Clothing:  No data recorded  Grooming:  Normal   Cosmetic use:  None   Posture/gait:  Normal   Motor activity:  Not Remarkable   Sensorium  Attention:  Normal   Concentration:  Normal   Orientation:  Person; Place; Situation   Recall/memory:  Defective in Short-term   Affect and Mood  Affect:  Full Range   Mood:  Euthymic   Relating  Eye contact:  Normal   Facial expression:  Responsive   Attitude toward examiner:  Cooperative   Thought and Language  Speech flow: Clear and Coherent   Thought content:  Appropriate to Mood and Circumstances   Preoccupation:  None   Hallucinations:  Auditory   Organization:  No data recorded  [employer] of Knowledge:  Fair   Intelligence:  Average   Abstraction:  Normal   Judgement:  Impaired   Reality Testing:  Adequate   Insight:  Fair   Decision Making:  Normal   Social Functioning  Social Maturity:  Irresponsible   Social Judgement:  Normal   Stress  Stressors:  Housing; Surveyor, quantity; Relationship   Coping Ability:  Overwhelmed; Exhausted   Skill Deficits:  None   Supports:  Support needed; Friends/Service system     Religion:    Leisure/Recreation:    Exercise/Diet:     CCA Employment/Education Employment/Work Situation: Employment / Work Situation Employment situation: Unemployed Patient's job has been impacted by current illness: Yes Describe how patient's job has been impacted: drinking has caused him to lose his job 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  Education:     CCA Family/Childhood History Family and Relationship History: Family history Marital status: Divorced Divorced, when?: UTA What types of issues  is patient dealing with in the relationship?: finacial, alcohol use Additional relationship information: none What is your sexual orientation?: UTA Has your sexual activity been affected by drugs, alcohol, medication, or emotional stress?: UTA  Childhood History:  Childhood History Additional childhood history information: UTA Description of patient's relationship with caregiver when they were a child: UTA Patient's description of current relationship with people who raised him/her: UTA How were you disciplined when you got in trouble as a child/adolescent?: UTA Does patient have siblings?: Yes  Child/Adolescent Assessment:     CCA Substance Use Alcohol/Drug Use: Alcohol / Drug Use Pain Medications: see MAR Prescriptions: see MAR Over the Counter: see MAR History of alcohol / drug use?: Yes Longest period of sobriety (when/how long): none reported Negative Consequences of Use: Financial,Personal relationships,Work / School Withdrawal Symptoms: Blackouts,Agitation,Fever / Chills,Irritability,Sweats,Tremors,Nausea / Vomiting,Patient aware of relationship between substance abuse and physical/medical complications Substance #1 Name of Substance 1: Alcohol 1 - Age of First Use: 16 1 - Amount (size/oz): 10-12  40 oz 1 - Frequency: daily 1 - Duration: ongoing 1 - Last Use / Amount: 04/13/2020 1- Route of Use: drinking Substance #2 Name of Substance 2: THC 2 - Age of First Use: 16 2 - Amount (size/oz): unknown 2 - Frequency: 1-2 weekly 2 - Duration: ongoing 2 - Last Use / Amount: 04/13/2020 2 - Route of Substance Use: smoking                     ASAM's:  Six Dimensions of Multidimensional Assessment  Dimension 1:  Acute Intoxication and/or Withdrawal Potential:      Dimension 2:  Biomedical Conditions and Complications:      Dimension 3:  Emotional, Behavioral, or Cognitive Conditions and Complications:     Dimension 4:  Readiness to Change:     Dimension 5:  Relapse,  Continued use, or Continued Problem Potential:     Dimension 6:  Recovery/Living Environment:     ASAM Severity Score:    ASAM Recommended Level of Treatment: ASAM Recommended Level of Treatment: Level III Residential Treatment   Substance use Disorder (SUD) Substance Use Disorder (SUD)  Checklist Symptoms of Substance Use: Continued use despite having a persistent/recurrent physical/psychological problem caused/exacerbated by use,Continued use despite persistent or recurrent social, interpersonal problems, caused or exacerbated by use,Evidence of tolerance,Evidence of withdrawal (Comment),Persistent desire or unsuccessful efforts to cut down or control use,Recurrent use that results in a failure to fulfill major role obligations (work, school, home),Substance(s) often taken in larger amounts or over longer times than was intended  Recommendations for Services/Supports/Treatments: Recommendations for Services/Supports/Treatments Recommendations For Services/Supports/Treatments: Detox,SAIOP (Substance Abuse Intensive Outpatient Program)  DSM5 Diagnoses: Patient Active Problem List   Diagnosis Date Noted  . AKI (acute kidney injury) (HCC) 11/20/2017  . Hypertension 11/20/2017  . Polycythemia 11/20/2017  . Alcohol intoxication with delirium (HCC) 11/20/2017  . Rhabdomyolysis 11/20/2017  . Seizures (HCC) 11/20/2017  . Hypokalemia 11/20/2017  . Alcohol dependence (HCC) 11/20/2017  . Cocaine use 11/20/2017  . Alcoholic ketoacidosis 11/20/2017   Per Marciano SequinJanet Sykes, NP, patient is psychiatrically cleared and recommended for outpatient services to include substance abuse treatment. List of resources provided.    Devaeh Amadi Shirlee MoreL Jiyah Torpey, [facility]

## 2020-04-14 NOTE — ED Notes (Signed)
Report given to telesitter.

## 2020-04-14 NOTE — ED Notes (Signed)
Pt had TTS

## 2020-04-14 NOTE — BH Assessment (Addendum)
Enzo Montgomery, RN , notified through secure chat that per Marciano Sequin, NP, patient is psychiatrically cleared and recommended for outpatient services to include substance abuse treatment. List of resources provided. Called and gave disposition to [place] also.

## 2020-04-14 NOTE — ED Provider Notes (Addendum)
7:10 PM-he has been seen and cleared by psychiatry for discharge home with outpatient resources given for assessment and treatment.  Currently, patient is calm.   He complains of a mild tremor at this time and would like some Librium for control of symptoms of alcohol withdrawal.  Patient is lucid.  Vital signs are normal/stable.    [name], MD 04/14/20 [phone]

## 2020-04-14 NOTE — ED Notes (Signed)
Pt provided with dinner tray, crackers and ginger ale.

## 2020-04-14 NOTE — ED Notes (Signed)
Gave pt belongings, d/c papers, and Good RX card. Pt ambulatory to lobby. Pt mother called to pick him up .

## 2020-04-24 ENCOUNTER — Other Ambulatory Visit: Payer: Self-pay

## 2020-04-24 ENCOUNTER — Emergency Department (HOSPITAL_COMMUNITY)
Admission: EM | Admit: 2020-04-24 | Discharge: 2020-04-24 | Disposition: A | Discharge status: Home / Self Care | Payer: Self-pay | Attending: Emergency Medicine | Admitting: Emergency Medicine

## 2020-04-24 ENCOUNTER — Encounter (HOSPITAL_COMMUNITY): Payer: Self-pay | Admitting: Emergency Medicine

## 2020-04-24 DIAGNOSIS — Y908 Blood alcohol level of 240 mg/100 ml or more: Secondary | ICD-10-CM | POA: Insufficient documentation

## 2020-04-24 DIAGNOSIS — F329 Major depressive disorder, single episode, unspecified: Secondary | ICD-10-CM | POA: Insufficient documentation

## 2020-04-24 DIAGNOSIS — F17210 Nicotine dependence, cigarettes, uncomplicated: Secondary | ICD-10-CM | POA: Insufficient documentation

## 2020-04-24 DIAGNOSIS — F1490 Cocaine use, unspecified, uncomplicated: Secondary | ICD-10-CM | POA: Insufficient documentation

## 2020-04-24 DIAGNOSIS — I10 Essential (primary) hypertension: Secondary | ICD-10-CM | POA: Insufficient documentation

## 2020-04-24 DIAGNOSIS — F1024 Alcohol dependence with alcohol-induced mood disorder: Secondary | ICD-10-CM | POA: Insufficient documentation

## 2020-04-24 DIAGNOSIS — R45851 Suicidal ideations: Secondary | ICD-10-CM | POA: Insufficient documentation

## 2020-04-24 DIAGNOSIS — Z79899 Other long term (current) drug therapy: Secondary | ICD-10-CM | POA: Insufficient documentation

## 2020-04-24 LAB — CBC
HCT: 43.4 % (ref 39.0–52.0)
Hemoglobin: 14.9 g/dL (ref 13.0–17.0)
MCH: 32.2 pg (ref 26.0–34.0)
MCHC: 34.3 g/dL (ref 30.0–36.0)
MCV: 93.7 fL (ref 80.0–100.0)
Platelets: 251 10*3/uL (ref 150–400)
RBC: 4.63 MIL/uL (ref 4.22–5.81)
RDW: 12.7 % (ref 11.5–15.5)
WBC: 7.4 10*3/uL (ref 4.0–10.5)
nRBC: 0 % (ref 0.0–0.2)

## 2020-04-24 LAB — COMPREHENSIVE METABOLIC PANEL
ALT: 43 U/L (ref 0–44)
AST: 34 U/L (ref 15–41)
Albumin: 4.2 g/dL (ref 3.5–5.0)
Alkaline Phosphatase: 54 U/L (ref 38–126)
Anion gap: 12 (ref 5–15)
BUN: 8 mg/dL (ref 6–20)
CO2: 21 mmol/L — ABNORMAL LOW (ref 22–32)
Calcium: 8.5 mg/dL — ABNORMAL LOW (ref 8.9–10.3)
Chloride: 104 mmol/L (ref 98–111)
Creatinine, Ser: 1.19 mg/dL (ref 0.61–1.24)
GFR, Estimated: >60 mL/min (ref >60)
Glucose, Bld: 132 mg/dL — ABNORMAL HIGH (ref 70–99)
Potassium: 3.5 mmol/L (ref 3.5–5.1)
Sodium: 137 mmol/L (ref 135–145)
Total Bilirubin: 0.5 mg/dL (ref 0.3–1.2)
Total Protein: 7.6 g/dL (ref 6.5–8.1)

## 2020-04-24 LAB — RAPID URINE DRUG SCREEN, HOSP PERFORMED
Amphetamines: NOT DETECTED
Barbiturates: NOT DETECTED
Benzodiazepines: NOT DETECTED
Cocaine: POSITIVE — AB
Opiates: NOT DETECTED
Tetrahydrocannabinol: NOT DETECTED

## 2020-04-24 LAB — ETHANOL: Alcohol, Ethyl (B): 309 mg/dL (ref <10)

## 2020-04-24 MED ORDER — THIAMINE HCL 100 MG PO TABS: 100.0000 mg | ORAL_TABLET | Freq: Every day | ORAL | Status: DC

## 2020-04-24 MED ORDER — LORAZEPAM 2 MG/ML IJ SOLN: 0.0000 mg | Freq: Two times a day (BID) | INTRAMUSCULAR | Status: DC

## 2020-04-24 MED ORDER — LORAZEPAM 2 MG/ML IJ SOLN: 0.0000 mg | Freq: Four times a day (QID) | INTRAMUSCULAR | Status: DC

## 2020-04-24 MED ORDER — THIAMINE HCL 100 MG/ML IJ SOLN: 100.0000 mg | Freq: Every day | INTRAMUSCULAR | Status: DC

## 2020-04-24 MED ORDER — LORAZEPAM 1 MG PO TABS
0.0000 mg | ORAL_TABLET | Freq: Four times a day (QID) | ORAL | Status: DC
  Administered 2020-04-24: 1 mg via ORAL
  Filled 2020-04-24: qty 1

## 2020-04-24 MED ORDER — LORAZEPAM 1 MG PO TABS: 0.0000 mg | ORAL_TABLET | Freq: Two times a day (BID) | ORAL | Status: DC

## 2020-04-24 NOTE — ED Notes (Signed)
Pt resting at this time. In no acute distress. Equal rise and fall of chest. Bed lowered and locked. Sitter present.
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
## 2020-04-24 NOTE — ED Notes (Signed)
Date and time results received: 04/24/20 5:07 AM  Test: Alcohol Critical Value: 3093  Name of Provider Notified: Dr. Blinda Leatherwood  Orders Received? Or Actions Taken?: Acknowledged

## 2020-04-24 NOTE — ED Triage Notes (Signed)
Pt states he wants to kill himself and others. Pt states he just wants to be happy. Pt has etoh on board.

## 2020-04-24 NOTE — Discharge Instructions (Addendum)
Follow-up as instructed by behavioral health 

## 2020-04-24 NOTE — ED Provider Notes (Signed)
[facility] EMERGENCY DEPARTMENT Provider Note   CSN: [id number] Arrival date & time: 04/24/20  0319     History Chief Complaint  Patient presents with  . V70.1    Paul Lambert is a 45 y.o. male.  Patient presents to the emergency department for evaluation of suicidal ideation.  Patient reports severe depression.  He has tearful, does not really want to expand on this.  He does admit to drinking alcohol.        Past Medical History:  Diagnosis Date  . Hypertension   . Seizures [facility])     Patient Active Problem List   Diagnosis Date Noted  . AKI (acute kidney injury) (HCC) 11/20/2017  . Hypertension 11/20/2017  . Polycythemia 11/20/2017  . Alcohol intoxication with delirium (HCC) 11/20/2017  . Rhabdomyolysis 11/20/2017  . Seizures (HCC) 11/20/2017  . Hypokalemia 11/20/2017  . Alcohol dependence (HCC) 11/20/2017  . Cocaine use 11/20/2017  . Alcoholic ketoacidosis 11/20/2017    Past Surgical History:  Procedure Laterality Date  . FACIAL COSMETIC SURGERY         Family History  Problem Relation Age of Onset  . Hypertension Mother   . Hypertension Father     Social History   Tobacco Use  . Smoking status: Current Every Day Smoker    Packs/day: 0.50    Types: Cigarettes  . Smokeless tobacco: Never Used  Vaping Use  . Vaping Use: Never used  Substance Use Topics  . Alcohol use: Yes    Alcohol/week: 40.0 - 52.0 standard drinks    Types: 24 - 36 Cans of beer, 16 Shots of liquor per week    Comment: "whatever I can"  . Drug use: Not Currently    Home Medications Prior to Admission medications   Medication Sig Start Date End Date Taking? Authorizing Provider  chlordiazePOXIDE (LIBRIUM) 25 MG capsule 50mg  PO TID x 1D, then 25-50mg  PO BID X 1D, then 25-50mg  PO QD X 1D 04/14/20   06/14/20, MD  famotidine (PEPCID) 20 MG tablet Take 1 tablet (20 mg total) by mouth 2 (two) times daily. 04/27/19   04/29/19, MD  ibuprofen (ADVIL) 800 MG tablet Take 1  tablet (800 mg total) by mouth 3 (three) times daily. 02/14/20   04/13/20, PA-C  lisinopril (ZESTRIL) 20 MG tablet Take 2 tablets (40 mg total) by mouth daily. 04/05/20   04/07/20, MD  ondansetron (ZOFRAN ODT) 4 MG disintegrating tablet 4mg  ODT q4 hours prn nausea/vomit 04/27/19   , MD    Allergies    Onion  Review of Systems   Review of Systems  Psychiatric/Behavioral: Positive for dysphoric mood and suicidal ideas.  All other systems reviewed and are negative.   Physical Exam Updated Vital Signs BP (!) 145/93   Pulse (!) 114   Temp 98.3 F (36.8 C) (Oral)   Resp 20   Wt 85 kg   SpO2 99%   BMI 27.67 kg/m   Physical Exam Vitals and nursing note reviewed.  Constitutional:      General: He is not in acute distress.    Appearance: Normal appearance. He is well-developed.  HENT:     Head: Normocephalic and atraumatic.     Right Ear: Hearing normal.     Left Ear: Hearing normal.     Nose: Nose normal.  Eyes:     Conjunctiva/sclera: Conjunctivae normal.     Pupils: Pupils are equal, round, and reactive to light.  Cardiovascular:  Rate and Rhythm: Regular rhythm.     Heart sounds: S1 normal and S2 normal. No murmur heard. No friction rub. No gallop.   Pulmonary:     Effort: Pulmonary effort is normal. No respiratory distress.     Breath sounds: Normal breath sounds.  Chest:     Chest wall: No tenderness.  Abdominal:     General: Bowel sounds are normal.     Palpations: Abdomen is soft.     Tenderness: There is no abdominal tenderness. There is no guarding or rebound. Negative signs include Murphy's sign and McBurney's sign.     Hernia: No hernia is present.  Musculoskeletal:        General: Normal range of motion.     Cervical back: Normal range of motion and neck supple.  Skin:    General: Skin is warm and dry.     Findings: No rash.  Neurological:     Mental Status: He is alert and oriented to person, place, and time.     GCS: GCS eye  subscore is 4. GCS verbal subscore is 5. GCS motor subscore is 6.     Cranial Nerves: No cranial nerve deficit.     Sensory: No sensory deficit.     Coordination: Coordination normal.  Psychiatric:        Mood and Affect: Affect is tearful.        Speech: Speech is slurred.        Behavior: Behavior is slowed and withdrawn.        Thought Content: Thought content includes suicidal ideation.     ED Results / Procedures / Treatments   Labs (all labs ordered are listed, but only abnormal results are displayed) Labs Reviewed - No data to display  EKG None  Radiology No results found.  Procedures Procedures   Medications Ordered in ED Medications - No data to display  ED Course  I have reviewed the triage vital signs and the nursing notes.  Pertinent labs & imaging results that were available during my care of the patient were reviewed by me and considered in my medical decision making (see chart for details).    MDM Rules/Calculators/A&P                          Patient presents with complaints of feeling severely depressed and wanting to kill himself.  He is intoxicated.  Will perform basic work-up and monitor.  Will require likely psychiatric evaluation when sober.  Final Clinical Impression(s) / ED Diagnoses Final diagnoses:  Suicidal ideation    Rx / DC Orders ED Discharge Orders    None       Jamael Hoffmann, Canary Brim, MD 04/24/20 [id number]

## 2020-04-24 NOTE — ED Notes (Signed)
Patient's belongings locked up.

## 2020-04-26 ENCOUNTER — Emergency Department (HOSPITAL_COMMUNITY)
Admission: EM | Admit: 2020-04-26 | Discharge: 2020-04-26 | Disposition: A | Discharge status: Home / Self Care | Payer: Self-pay | Attending: Emergency Medicine | Admitting: Emergency Medicine

## 2020-04-26 ENCOUNTER — Other Ambulatory Visit: Payer: Self-pay

## 2020-04-26 ENCOUNTER — Encounter (HOSPITAL_COMMUNITY): Payer: Self-pay | Admitting: Emergency Medicine

## 2020-04-26 DIAGNOSIS — F10920 Alcohol use, unspecified with intoxication, uncomplicated: Secondary | ICD-10-CM

## 2020-04-26 DIAGNOSIS — Z79899 Other long term (current) drug therapy: Secondary | ICD-10-CM | POA: Insufficient documentation

## 2020-04-26 DIAGNOSIS — I10 Essential (primary) hypertension: Secondary | ICD-10-CM | POA: Insufficient documentation

## 2020-04-26 DIAGNOSIS — F10129 Alcohol abuse with intoxication, unspecified: Secondary | ICD-10-CM | POA: Insufficient documentation

## 2020-04-26 DIAGNOSIS — F1014 Alcohol abuse with alcohol-induced mood disorder: Secondary | ICD-10-CM

## 2020-04-26 DIAGNOSIS — Z5900 Homelessness unspecified: Secondary | ICD-10-CM | POA: Insufficient documentation

## 2020-04-26 DIAGNOSIS — F17210 Nicotine dependence, cigarettes, uncomplicated: Secondary | ICD-10-CM | POA: Insufficient documentation

## 2020-04-26 DIAGNOSIS — F1410 Cocaine abuse, uncomplicated: Secondary | ICD-10-CM

## 2020-04-26 DIAGNOSIS — R45851 Suicidal ideations: Secondary | ICD-10-CM | POA: Insufficient documentation

## 2020-04-26 HISTORY — DX: Alcohol abuse, uncomplicated: F10.10

## 2020-04-26 LAB — BASIC METABOLIC PANEL
Anion gap: 12 (ref 5–15)
BUN: 7 mg/dL (ref 6–20)
CO2: 24 mmol/L (ref 22–32)
Calcium: 8.6 mg/dL — ABNORMAL LOW (ref 8.9–10.3)
Chloride: 107 mmol/L (ref 98–111)
Creatinine, Ser: 0.99 mg/dL (ref 0.61–1.24)
GFR, Estimated: >60 mL/min (ref >60)
Glucose, Bld: 142 mg/dL — ABNORMAL HIGH (ref 70–99)
Potassium: 3.5 mmol/L (ref 3.5–5.1)
Sodium: 143 mmol/L (ref 135–145)

## 2020-04-26 LAB — CBC WITH DIFFERENTIAL/PLATELET
Abs Immature Granulocytes: 0.01 10*3/uL (ref 0.00–0.07)
Basophils Absolute: 0.1 10*3/uL (ref 0.0–0.1)
Basophils Relative: 2 %
Eosinophils Absolute: 0.1 10*3/uL (ref 0.0–0.5)
Eosinophils Relative: 2 %
HCT: 46.3 % (ref 39.0–52.0)
Hemoglobin: 15.7 g/dL (ref 13.0–17.0)
Immature Granulocytes: 0 %
Lymphocytes Relative: 54 %
Lymphs Abs: 3 10*3/uL (ref 0.7–4.0)
MCH: 31.6 pg (ref 26.0–34.0)
MCHC: 33.9 g/dL (ref 30.0–36.0)
MCV: 93.2 fL (ref 80.0–100.0)
Monocytes Absolute: 0.4 10*3/uL (ref 0.1–1.0)
Monocytes Relative: 7 %
Neutro Abs: 2 10*3/uL (ref 1.7–7.7)
Neutrophils Relative %: 35 %
Platelets: 246 10*3/uL (ref 150–400)
RBC: 4.97 MIL/uL (ref 4.22–5.81)
RDW: 12.8 % (ref 11.5–15.5)
WBC: 5.6 10*3/uL (ref 4.0–10.5)
nRBC: 0 % (ref 0.0–0.2)

## 2020-04-26 LAB — RAPID URINE DRUG SCREEN, HOSP PERFORMED
Amphetamines: NOT DETECTED
Barbiturates: NOT DETECTED
Benzodiazepines: NOT DETECTED
Cocaine: POSITIVE — AB
Opiates: NOT DETECTED
Tetrahydrocannabinol: NOT DETECTED

## 2020-04-26 LAB — ETHANOL: Alcohol, Ethyl (B): 351 mg/dL (ref <10)
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
## 2020-04-26 NOTE — ED Notes (Signed)
Pt now stating that he is unhappy and having suicidial thoughts.

## 2020-04-26 NOTE — ED Notes (Signed)
Pt belongings secured in [place].

## 2020-04-26 NOTE — ED Notes (Signed)
Date and time results received: 04/26/20 5:22 AM(use smartphrase ".now" to insert current time)  Test: ETOH Critical Value: 351  Name of Provider Notified: Dr Judd Lien  Orders Received? Or Actions Taken?: see chart

## 2020-04-26 NOTE — ED Notes (Signed)
Pt ambulated to restroom without complication °

## 2020-04-26 NOTE — ED Notes (Signed)
Personal belongings provided to patient and informed him that he was placed for discharge to start getting self ready and patient acknowledges this nurse and rolls back over and goes back to sleep.

## 2020-04-26 NOTE — ED Triage Notes (Addendum)
Pt here requesting ETOH detox. Pt obviously intoxicated. Pt recently here for same on 04/24/20. Pt denies following up with behavioral health.

## 2020-04-26 NOTE — ED Notes (Signed)
During assessment, Pt raised voice towards ED Staff, making verbal threats. Pt uncooperative at this time and refusing to answer assessment questions.

## 2020-04-26 NOTE — ED Notes (Signed)
Pt has been wanded by security. 

## 2020-04-26 NOTE — Discharge Instructions (Addendum)
It was our pleasure to provide your ER care today - we hope that you feel better.  Avoid alcohol and drug use as their use is harmful to your physical and mental well-being. See resource guide provided for community treatment programs.   Your blood pressure is high - continue your blood pressure medication, limit salt intake, avoid cocaine use, and follow up with primary care doctor in 1-2 weeks.   Follow up with primary care doctor and mental health provider in the next 1-2 weeks.   Return to ER if worse, new symptoms, fevers, trouble breathing, or other emergency concern.

## 2020-04-26 NOTE — ED Provider Notes (Signed)
Recheck, pt more sober, is calm, cooperative.   Po fluids/food.  Pt has normal mood and affect. Pt expresses no thoughts of harm to self or others. No SI/HI.    Reviewed nursing notes and prior charts for additional history. 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 Pt reports feeling improved this AM, no SI.   Pt currently appears stable for d/c.   Rec outpt pcp and bh f/u - resource guide provided.      Cathren Laine, MD 04/26/20 [phone]

## 2020-07-26 ENCOUNTER — Emergency Department (HOSPITAL_COMMUNITY)
Admission: EM | Admit: 2020-07-26 | Discharge: 2020-07-26 | Disposition: A | Discharge status: Home / Self Care | Payer: Self-pay

## 2020-07-26 ENCOUNTER — Emergency Department (HOSPITAL_COMMUNITY)
Admission: EM | Admit: 2020-07-26 | Discharge: 2020-07-27 | Disposition: A | Discharge status: Home / Self Care | Payer: Self-pay | Attending: Emergency Medicine | Admitting: Emergency Medicine

## 2020-07-26 ENCOUNTER — Emergency Department (HOSPITAL_COMMUNITY): Payer: Self-pay

## 2020-07-26 ENCOUNTER — Encounter (HOSPITAL_COMMUNITY): Payer: Self-pay | Admitting: *Deleted

## 2020-07-26 ENCOUNTER — Emergency Department (HOSPITAL_COMMUNITY)
Admission: EM | Admit: 2020-07-26 | Discharge: 2020-07-26 | Disposition: A | Discharge status: Home / Self Care | Payer: Self-pay | Attending: Emergency Medicine | Admitting: Emergency Medicine

## 2020-07-26 ENCOUNTER — Other Ambulatory Visit: Payer: Self-pay

## 2020-07-26 ENCOUNTER — Encounter (HOSPITAL_COMMUNITY): Payer: Self-pay

## 2020-07-26 DIAGNOSIS — F10920 Alcohol use, unspecified with intoxication, uncomplicated: Secondary | ICD-10-CM

## 2020-07-26 DIAGNOSIS — F17210 Nicotine dependence, cigarettes, uncomplicated: Secondary | ICD-10-CM | POA: Insufficient documentation

## 2020-07-26 DIAGNOSIS — I10 Essential (primary) hypertension: Secondary | ICD-10-CM | POA: Insufficient documentation

## 2020-07-26 DIAGNOSIS — F10129 Alcohol abuse with intoxication, unspecified: Secondary | ICD-10-CM | POA: Insufficient documentation

## 2020-07-26 DIAGNOSIS — Z79899 Other long term (current) drug therapy: Secondary | ICD-10-CM | POA: Insufficient documentation

## 2020-07-26 DIAGNOSIS — R1084 Generalized abdominal pain: Secondary | ICD-10-CM

## 2020-07-26 DIAGNOSIS — K5900 Constipation, unspecified: Secondary | ICD-10-CM | POA: Insufficient documentation

## 2020-07-26 DIAGNOSIS — J029 Acute pharyngitis, unspecified: Secondary | ICD-10-CM | POA: Insufficient documentation

## 2020-07-26 DIAGNOSIS — Y908 Blood alcohol level of 240 mg/100 ml or more: Secondary | ICD-10-CM | POA: Insufficient documentation

## 2020-07-26 LAB — COMPREHENSIVE METABOLIC PANEL
ALT: 19 U/L (ref 0–44)
AST: 25 U/L (ref 15–41)
Albumin: 4 g/dL (ref 3.5–5.0)
Alkaline Phosphatase: 40 U/L (ref 38–126)
Anion gap: 9 (ref 5–15)
BUN: 9 mg/dL (ref 6–20)
CO2: 22 mmol/L (ref 22–32)
Calcium: 7.8 mg/dL — ABNORMAL LOW (ref 8.9–10.3)
Chloride: 112 mmol/L — ABNORMAL HIGH (ref 98–111)
Creatinine, Ser: 0.91 mg/dL (ref 0.61–1.24)
GFR, Estimated: >60 mL/min (ref >60)
Glucose, Bld: 87 mg/dL (ref 70–99)
Potassium: 4 mmol/L (ref 3.5–5.1)
Sodium: 143 mmol/L (ref 135–145)
Total Bilirubin: 0.6 mg/dL (ref 0.3–1.2)
Total Protein: 6.6 g/dL (ref 6.5–8.1)

## 2020-07-26 LAB — CBC WITH DIFFERENTIAL/PLATELET
Abs Immature Granulocytes: 0.01 10*3/uL (ref 0.00–0.07)
Basophils Absolute: 0.1 10*3/uL (ref 0.0–0.1)
Basophils Relative: 1 %
Eosinophils Absolute: 0.2 10*3/uL (ref 0.0–0.5)
Eosinophils Relative: 4 %
HCT: 46.2 % (ref 39.0–52.0)
Hemoglobin: 15.1 g/dL (ref 13.0–17.0)
Immature Granulocytes: 0 %
Lymphocytes Relative: 57 %
Lymphs Abs: 2.9 10*3/uL (ref 0.7–4.0)
MCH: 30.7 pg (ref 26.0–34.0)
MCHC: 32.7 g/dL (ref 30.0–36.0)
MCV: 93.9 fL (ref 80.0–100.0)
Monocytes Absolute: 0.4 10*3/uL (ref 0.1–1.0)
Monocytes Relative: 8 %
Neutro Abs: 1.6 10*3/uL — ABNORMAL LOW (ref 1.7–7.7)
Neutrophils Relative %: 30 %
Platelets: 221 10*3/uL (ref 150–400)
RBC: 4.92 MIL/uL (ref 4.22–5.81)
RDW: 12.7 % (ref 11.5–15.5)
WBC: 5.2 10*3/uL (ref 4.0–10.5)
nRBC: 0 % (ref 0.0–0.2)

## 2020-07-26 LAB — LIPASE, BLOOD: Lipase: 33 U/L (ref 11–51)

## 2020-07-26 LAB — ETHANOL: Alcohol, Ethyl (B): 244 mg/dL — ABNORMAL HIGH (ref <10)

## 2020-07-26 MED ORDER — POLYETHYLENE GLYCOL 3350 17 G PO PACK: 17.0000 g | PACK | Freq: Every day | ORAL | 0 refills | Status: DC

## 2020-07-26 MED ORDER — SODIUM CHLORIDE 0.9 % IV BOLUS
1000.0000 mL | Freq: Once | INTRAVENOUS | Status: AC
  Administered 2020-07-26: 1000 mL via INTRAVENOUS

## 2020-07-26 MED ORDER — ONDANSETRON HCL 4 MG/2ML IJ SOLN
4.0000 mg | Freq: Once | INTRAMUSCULAR | Status: AC
  Administered 2020-07-26: 4 mg via INTRAVENOUS
  Filled 2020-07-26: qty 2

## 2020-07-26 MED ORDER — IOHEXOL 300 MG/ML  SOLN
100.0000 mL | Freq: Once | INTRAMUSCULAR | Status: AC | PRN
  Administered 2020-07-26: 100 mL via INTRAVENOUS

## 2020-07-26 NOTE — ED Notes (Signed)
ED Provider at bedside. 

## 2020-07-26 NOTE — ED Provider Notes (Signed)
[facility] EMERGENCY DEPARTMENT Provider Note   CSN: [id number] Arrival date & time: 07/26/20  0220     History Chief Complaint  Patient presents with   Abdominal Pain    Constipation     Paul Lambert is a 45 y.o. male.  Level 5 caveat for intoxication. Patient with history of alcohol abuse, hypertension here with a 4-day history of diffuse abdominal pain worse in his lower abdomen.  Reports the pain is constant and nothing makes it better or worse.  He took [facility] powders without relief.  States his last bowel was 2 days ago and feels like he is constipated.  Normally moves his bowels every day.  Stool has not been black or bloody.  Took an over-the-counter stool softener without relief.  Has had nausea but no vomiting.  Still able to eat and drink.  No chest pain or shortness of breath.  No pain with urination or blood in the urine.  No previous abdominal surgeries. States he does not drink every day but only on the weekends.  Denies any other drug use.  The history is provided by the patient.  Abdominal Pain Associated symptoms: constipation and nausea   Associated symptoms: no cough, no diarrhea, no dysuria, no fever, no hematuria, no shortness of breath and no vomiting       Past Medical History:  Diagnosis Date   ETOH abuse    Hypertension    Seizures (HCC)     Patient Active Problem List   Diagnosis Date Noted   AKI (acute kidney injury) (HCC) 11/20/2017   Hypertension 11/20/2017   Polycythemia 11/20/2017   Alcohol intoxication with delirium (HCC) 11/20/2017   Rhabdomyolysis 11/20/2017   Seizures (HCC) 11/20/2017   Hypokalemia 11/20/2017   Alcohol dependence (HCC) 11/20/2017   Cocaine use 11/20/2017   Alcoholic ketoacidosis 11/20/2017    Past Surgical History:  Procedure Laterality Date   FACIAL COSMETIC SURGERY         Family History  Problem Relation Age of Onset   Hypertension Mother    Hypertension Father     Social History   Tobacco Use    Smoking status: Every Day    Packs/day: 0.50    Pack years: 0.00    Types: Cigarettes   Smokeless tobacco: Never  Vaping Use   Vaping Use: Never used  Substance Use Topics   Alcohol use: Yes    Alcohol/week: 40.0 - 52.0 standard drinks    Types: 24 - 36 Cans of beer, 16 Shots of liquor per week    Comment: "whatever I can"   Drug use: Not Currently    Home Medications Prior to Admission medications   Medication Sig Start Date End Date Taking? Authorizing Provider  chlordiazePOXIDE (LIBRIUM) 25 MG capsule 50mg  PO TID x 1D, then 25-50mg  PO BID X 1D, then 25-50mg  PO QD X 1D Patient not taking: Reported on 04/24/2020 04/14/20   06/14/20, MD  famotidine (PEPCID) 20 MG tablet Take 1 tablet (20 mg total) by mouth 2 (two) times daily. 04/27/19   04/29/19, MD  ibuprofen (ADVIL) 800 MG tablet Take 1 tablet (800 mg total) by mouth 3 (three) times daily. 02/14/20   04/13/20, PA-C  lisinopril (ZESTRIL) 20 MG tablet Take 2 tablets (40 mg total) by mouth daily. 04/05/20   04/07/20, MD  ondansetron (ZOFRAN ODT) 4 MG disintegrating tablet 4mg  ODT q4 hours prn nausea/vomit Patient taking differently: Take 4 mg by mouth every 4 (four) hours  as needed for nausea or vomiting. 04/27/19   Bethann Berkshire, MD    Allergies    Onion  Review of Systems   Review of Systems  Constitutional:  Positive for appetite change. Negative for activity change and fever.  HENT:  Negative for congestion and rhinorrhea.   Respiratory:  Negative for cough, chest tightness and shortness of breath.   Gastrointestinal:  Positive for abdominal pain, constipation and nausea. Negative for blood in stool, diarrhea and vomiting.  Genitourinary:  Negative for dysuria and hematuria.  Musculoskeletal:  Negative for arthralgias and myalgias.  Skin:  Negative for rash.  Neurological:  Negative for dizziness, weakness and headaches.   all other systems are negative except as noted in the HPI and PMH.   Physical  Exam Updated Vital Signs BP (!) 124/93 (BP Location: Right Arm)   Pulse 77   Temp 97.8 F (36.6 C) (Oral)   Resp 16   Ht 5\' 9"  (1.753 m)   Wt 77.1 kg   SpO2 100%   BMI 25.10 kg/m   Physical Exam Vitals and nursing note reviewed.  Constitutional:      General: He is not in acute distress.    Appearance: He is well-developed.     Comments: Sleepy but arousable  HENT:     Head: Normocephalic and atraumatic.     Mouth/Throat:     Pharynx: No oropharyngeal exudate.  Eyes:     Conjunctiva/sclera: Conjunctivae normal.     Pupils: Pupils are equal, round, and reactive to light.  Neck:     Comments: No meningismus. Cardiovascular:     Rate and Rhythm: Normal rate and regular rhythm.     Heart sounds: Normal heart sounds. No murmur heard. Pulmonary:     Effort: Pulmonary effort is normal. No respiratory distress.     Breath sounds: Normal breath sounds.  Abdominal:     Palpations: Abdomen is soft.     Tenderness: There is abdominal tenderness. There is guarding. There is no rebound.     Comments: Tenderness to lower quadrants with voluntary guarding.  Difficult to examine as he is jumping off the bed and pushing examining hand away. However abdomen is soft and not rigid  Genitourinary:    Comments: Refuses rectal exam Musculoskeletal:        General: No tenderness. Normal range of motion.     Cervical back: Normal range of motion and neck supple.  Skin:    General: Skin is warm.  Neurological:     Mental Status: He is alert and oriented to person, place, and time.     Cranial Nerves: No cranial nerve deficit.     Motor: No abnormal muscle tone.     Coordination: Coordination normal.     Comments: No ataxia on finger to nose bilaterally. No pronator drift. 5/5 strength throughout. CN 2-12 intact.Equal grip strength. Sensation intact.   Psychiatric:        Behavior: Behavior normal.    ED Results / Procedures / Treatments   Labs (all labs ordered are listed, but only  abnormal results are displayed) Labs Reviewed  ETHANOL - Abnormal; Notable for the following components:      Result Value   Alcohol, Ethyl (B) 244 (*)    All other components within normal limits  COMPREHENSIVE METABOLIC PANEL - Abnormal; Notable for the following components:   Chloride 112 (*)    Calcium 7.8 (*)    All other components within normal limits  CBC WITH  DIFFERENTIAL/PLATELET - Abnormal; Notable for the following components:   Neutro Abs 1.6 (*)    All other components within normal limits  LIPASE, BLOOD  CBC WITH DIFFERENTIAL/PLATELET  URINALYSIS, ROUTINE W REFLEX MICROSCOPIC    EKG None  Radiology CT ABDOMEN PELVIS W CONTRAST  Result Date: 07/26/2020 CLINICAL DATA:  45 year old male with abdominal pain for 4 days. EXAM: CT ABDOMEN AND PELVIS WITH CONTRAST TECHNIQUE: Multidetector CT imaging of the abdomen and pelvis was performed using the standard protocol following bolus administration of intravenous contrast. CONTRAST:  OMNIPAQUE IOHEXOL 300 MG/ML  SOLN COMPARISON:  Abdominal radiographs earlier today. FINDINGS: Lower chest: Negative. Minor atelectasis in the costophrenic angles. Hepatobiliary: Negative liver and gallbladder. No bile duct enlargement. Pancreas: Negative. Spleen: Negative. Adrenals/Urinary Tract: Normal adrenal glands. Symmetric and normal renal enhancement and contrast excretion. No nephrolithiasis. Decompressed and negative ureters in the abdomen. Distal ureters are difficult to delineate. There are multiple bilateral pelvic phleboliths. Bladder is mildly distended but otherwise unremarkable. Stomach/Bowel: Negative rectum. Redundant sigmoid. Mild retained stool in the transverse and descending colon. Decompressed right colon. Normal appendix medial to the cecum identified on series 2, image 50 and coronal images 43 and 48. Terminal ileum appears within normal limits. Fluid containing but diminutive distal small bowel loops. No dilated small bowel.  Decompressed stomach and duodenum. No free air or free fluid. No convincing mesenteric stranding. Vascular/Lymphatic: Major arterial structures are patent and normal aside from mild tortuosity. Portal venous system is patent. No lymphadenopathy. Reproductive: Negative. Other: No pelvic free fluid. Musculoskeletal: Negative. IMPRESSION: No acute or inflammatory process identified in the abdomen or pelvis. Normal appendix. Electronically Signed   By: Odessa Fleming M.D.   On: 07/26/2020 07:53   DG Abdomen Acute W/Chest  Result Date: 07/26/2020 CLINICAL DATA:  Abdominal pain and constipation for 2 days. Current smoker. EXAM: DG ABDOMEN ACUTE WITH 1 VIEW CHEST COMPARISON:  04/27/2019 FINDINGS: Heart size and pulmonary vascularity are normal. Lungs are clear. No pleural effusion or pneumothorax. Mediastinal contours appear intact. Gas and stool throughout the colon. No small or large bowel distention. No free intra-abdominal air. No abnormal air-fluid levels. No radiopaque stones. Visualized bones and soft tissue contours appear intact. Calcified phleboliths in the pelvis. IMPRESSION: No evidence of active pulmonary disease. Normal nonobstructive bowel gas pattern. Electronically Signed   By: Burman Nieves M.D.   On: 07/26/2020 03:57    Procedures Procedures   Medications Ordered in ED Medications  sodium chloride 0.9 % bolus 1,000 mL (has no administration in time range)  ondansetron (ZOFRAN) injection 4 mg (has no administration in time range)    ED Course  I have reviewed the triage vital signs and the nursing notes.  Pertinent labs & imaging results that were available during my care of the patient were reviewed by me and considered in my medical decision making (see chart for details).    MDM Rules/Calculators/A&P                         Lower abdominal pain with constipation and nausea.  Diffusely tender with voluntary guarding  IV fluids given.  He appears intoxicated.  Acute abdominal series  is negative for obstruction or free air.  Labs are reassuring.  Patient declines rectal exam.  Hemoglobin is stable.  Denies any black or bloody stools.  Given that he is difficult to examine and intoxicated, CT imaging obtained to rule out intra-abdominal pathology.  This is negative.  No  significant evidence of constipation. Does show some stool. No acute surgical pathology.  Patient tolerating p.o. and ambulatory.  Advised PCP follow-up.  Reduce alcohol intake. He will be able to be discharged when he has a sober ride Final Clinical Impression(s) / ED Diagnoses Final diagnoses:  Generalized abdominal pain  Constipation, unspecified constipation type  Alcoholic intoxication without complication [facility](HCC)    Rx / DC Orders ED Discharge Orders     None        Rachelann Enloe, Jeannett SeniorStephen, MD 07/26/20 [phone]

## 2020-07-26 NOTE — ED Triage Notes (Signed)
Pt c/o sore throat that started last night.

## 2020-07-26 NOTE — ED Notes (Signed)
Patient walked out 5 minutes after triage

## 2020-07-26 NOTE — ED Triage Notes (Signed)
PATIENT NOT ASKED TO SIGN DUE TO BAD ATTITUDE

## 2020-07-26 NOTE — ED Triage Notes (Signed)
Pt reports constipation and abd pain x 4 days. Pt says last BM was two days ago, but it was only a little bit. Denies N/V

## 2020-07-26 NOTE — ED Notes (Signed)
Pt ambulates w/o difficulty and tolerated PO ginger ale

## 2020-07-26 NOTE — ED Triage Notes (Signed)
Patient c/o sore throat. Very argumentative in triage and using profanity.  States he wants medication for sore throat and advised he should asK the PA when evaluated. Continues to use profanity and states we will let somebody die. Asked to return to the waiting area.

## 2020-07-26 NOTE — Discharge Instructions (Addendum)
Your testing is reassuring.  Reduce your alcohol intake. Return to the ED with worsening pain, vomiting, or any other concerns.

## 2020-07-27 LAB — GROUP A STREP BY PCR: Group A Strep by PCR: NOT DETECTED

## 2020-07-27 MED ORDER — DEXAMETHASONE 4 MG PO TABS
10.0000 mg | ORAL_TABLET | Freq: Once | ORAL | Status: AC
  Administered 2020-07-27: 10 mg via ORAL
  Filled 2020-07-27: qty 3

## 2020-07-27 NOTE — ED Provider Notes (Signed)
[facility] EMERGENCY DEPARTMENT Provider Note   CSN: [id number] Arrival date & time: 07/26/20  1932     History Chief Complaint  Patient presents with   Sore Throat    Paul Lambert is a 45 y.o. male.  The history is provided by the patient.  Sore Throat He has history of hypertension, seizures, alcohol abuse and comes in complaining of sore throat which started yesterday.  He states she is having difficulty swallowing and has had subjective fever but no chills or sweats.  He does admit to having consumed alcohol earlier in the day.  He denies any nausea or vomiting.   Past Medical History:  Diagnosis Date   ETOH abuse    Hypertension    Seizures (HCC)     Patient Active Problem List   Diagnosis Date Noted   AKI (acute kidney injury) (HCC) 11/20/2017   Hypertension 11/20/2017   Polycythemia 11/20/2017   Alcohol intoxication with delirium (HCC) 11/20/2017   Rhabdomyolysis 11/20/2017   Seizures (HCC) 11/20/2017   Hypokalemia 11/20/2017   Alcohol dependence (HCC) 11/20/2017   Cocaine use 11/20/2017   Alcoholic ketoacidosis 11/20/2017    Past Surgical History:  Procedure Laterality Date   FACIAL COSMETIC SURGERY         Family History  Problem Relation Age of Onset   Hypertension Mother    Hypertension Father     Social History   Tobacco Use   Smoking status: Every Day    Packs/day: 0.50    Pack years: 0.00    Types: Cigarettes   Smokeless tobacco: Never  Vaping Use   Vaping Use: Never used  Substance Use Topics   Alcohol use: Yes    Alcohol/week: 40.0 - 52.0 standard drinks    Types: 24 - 36 Cans of beer, 16 Shots of liquor per week    Comment: "whatever I can"   Drug use: Not Currently    Home Medications Prior to Admission medications   Medication Sig Start Date End Date Taking? Authorizing Provider  chlordiazePOXIDE (LIBRIUM) 25 MG capsule 50mg  PO TID x 1D, then 25-50mg  PO BID X 1D, then 25-50mg  PO QD X 1D Patient not taking: Reported on  04/24/2020 04/14/20   06/14/20, MD  famotidine (PEPCID) 20 MG tablet Take 1 tablet (20 mg total) by mouth 2 (two) times daily. 04/27/19   04/29/19, MD  ibuprofen (ADVIL) 800 MG tablet Take 1 tablet (800 mg total) by mouth 3 (three) times daily. 02/14/20   04/13/20, PA-C  lisinopril (ZESTRIL) 20 MG tablet Take 2 tablets (40 mg total) by mouth daily. 04/05/20   04/07/20, MD  ondansetron (ZOFRAN ODT) 4 MG disintegrating tablet 4mg  ODT q4 hours prn nausea/vomit Patient taking differently: Take 4 mg by mouth every 4 (four) hours as needed for nausea or vomiting. 04/27/19   , MD  polyethylene glycol (MIRALAX) 17 g packet Take 17 g by mouth daily. 07/26/20   Rancour, Bethann Berkshire, MD    Allergies    Onion  Review of Systems   Review of Systems  All other systems reviewed and are negative.  Physical Exam Updated Vital Signs BP (!) 110/93 (BP Location: Right Arm)   Pulse 85   Temp 98.4 F (36.9 C) (Oral)   Resp 17   Ht 5\' 9"  (1.753 m)   Wt 77.1 kg   SpO2 97%   BMI 25.10 kg/m   Physical Exam Vitals and nursing note reviewed.  45 year old male,  resting comfortably and in no acute distress. Vital signs are significant for borderline elevated diastolic blood pressure. Oxygen saturation is 97%, which is normal. Head is normocephalic and atraumatic. PERRLA, EOMI. Oropharynx shows mild erythema without exudate.  There is minimal edema of the uvula.  There is no pooling of secretions.  Phonation is normal.  Soft tissues of the floor of the mouth appear normal. Neck is nontender and supple without adenopathy or JVD. Back is nontender and there is no CVA tenderness. Lungs are clear without rales, wheezes, or rhonchi. Chest is nontender. Heart has regular rate and rhythm without murmur. Abdomen is soft, flat, nontender without masses or hepatosplenomegaly and peristalsis is normoactive. Extremities have no cyanosis or edema, full range of motion is present. Skin is warm and  dry without rash. Neurologic: Mental status is normal, cranial nerves are intact, there are no motor or sensory deficits.  ED Results / Procedures / Treatments   Labs (all labs ordered are listed, but only abnormal results are displayed) Labs Reviewed  GROUP A STREP BY PCR   Procedures Procedures   Medications Ordered in ED Medications  dexamethasone (DECADRON) tablet 10 mg (has no administration in time range)    ED Course  I have reviewed the triage vital signs and the nursing notes.  Pertinent lab results that were available during my care of the patient were reviewed by me and considered in my medical decision making (see chart for details).   MDM Rules/Calculators/A&P                         Sore throat without any worrisome findings on physical exam.  Will check strep PCR test.  He is given a dose of oral dexamethasone.  Old records are reviewed showing ED visit yesterday for alcohol intoxication.  Strep PCR is negative.  He is discharged with instructions to continue conservative treatment, return if symptoms are worsening.  Final Clinical Impression(s) / ED Diagnoses Final diagnoses:  Viral pharyngitis    Rx / DC Orders ED Discharge Orders     None        [name], MD 07/27/20 [id number]

## 2020-07-27 NOTE — Discharge Instructions (Addendum)
Drink plenty of fluids.  Use warm salt water gargles.  Use lozenges and throat sprays as needed.  Return if symptoms are getting worse.

## 2020-07-31 ENCOUNTER — Emergency Department (HOSPITAL_COMMUNITY): Payer: Self-pay

## 2020-07-31 ENCOUNTER — Other Ambulatory Visit: Payer: Self-pay

## 2020-07-31 ENCOUNTER — Emergency Department (HOSPITAL_COMMUNITY)
Admission: EM | Admit: 2020-07-31 | Discharge: 2020-07-31 | Disposition: A | Discharge status: Home / Self Care | Payer: Self-pay | Attending: Emergency Medicine | Admitting: Emergency Medicine

## 2020-07-31 DIAGNOSIS — Z20822 Contact with and (suspected) exposure to covid-19: Secondary | ICD-10-CM | POA: Insufficient documentation

## 2020-07-31 DIAGNOSIS — J189 Pneumonia, unspecified organism: Secondary | ICD-10-CM | POA: Insufficient documentation

## 2020-07-31 DIAGNOSIS — R55 Syncope and collapse: Secondary | ICD-10-CM | POA: Insufficient documentation

## 2020-07-31 DIAGNOSIS — F17210 Nicotine dependence, cigarettes, uncomplicated: Secondary | ICD-10-CM | POA: Insufficient documentation

## 2020-07-31 DIAGNOSIS — R000 Tachycardia, unspecified: Secondary | ICD-10-CM | POA: Insufficient documentation

## 2020-07-31 DIAGNOSIS — Z79899 Other long term (current) drug therapy: Secondary | ICD-10-CM | POA: Insufficient documentation

## 2020-07-31 DIAGNOSIS — R1033 Periumbilical pain: Secondary | ICD-10-CM | POA: Insufficient documentation

## 2020-07-31 DIAGNOSIS — I10 Essential (primary) hypertension: Secondary | ICD-10-CM | POA: Insufficient documentation

## 2020-07-31 DIAGNOSIS — Y908 Blood alcohol level of 240 mg/100 ml or more: Secondary | ICD-10-CM | POA: Insufficient documentation

## 2020-07-31 LAB — URINALYSIS, ROUTINE W REFLEX MICROSCOPIC
Bacteria, UA: NONE SEEN
Bilirubin Urine: NEGATIVE
Glucose, UA: NEGATIVE mg/dL
Ketones, ur: NEGATIVE mg/dL
Leukocytes,Ua: NEGATIVE
Nitrite: NEGATIVE
Protein, ur: NEGATIVE mg/dL
Specific Gravity, Urine: 1.01 (ref 1.005–1.030)
pH: 6 (ref 5.0–8.0)

## 2020-07-31 LAB — CBC WITH DIFFERENTIAL/PLATELET
Abs Immature Granulocytes: 0.09 10*3/uL — ABNORMAL HIGH (ref 0.00–0.07)
Basophils Absolute: 0.1 10*3/uL (ref 0.0–0.1)
Basophils Relative: 1 %
Eosinophils Absolute: 0 10*3/uL (ref 0.0–0.5)
Eosinophils Relative: 0 %
HCT: 42.6 % (ref 39.0–52.0)
Hemoglobin: 14.8 g/dL (ref 13.0–17.0)
Immature Granulocytes: 1 %
Lymphocytes Relative: 17 %
Lymphs Abs: 2.1 10*3/uL (ref 0.7–4.0)
MCH: 31.4 pg (ref 26.0–34.0)
MCHC: 34.7 g/dL (ref 30.0–36.0)
MCV: 90.3 fL (ref 80.0–100.0)
Monocytes Absolute: 0.6 10*3/uL (ref 0.1–1.0)
Monocytes Relative: 5 %
Neutro Abs: 10 10*3/uL — ABNORMAL HIGH (ref 1.7–7.7)
Neutrophils Relative %: 76 %
Platelets: 225 10*3/uL (ref 150–400)
RBC: 4.72 MIL/uL (ref 4.22–5.81)
RDW: 12.1 % (ref 11.5–15.5)
WBC: 12.9 10*3/uL — ABNORMAL HIGH (ref 4.0–10.5)
nRBC: 0 % (ref 0.0–0.2)

## 2020-07-31 LAB — COMPREHENSIVE METABOLIC PANEL
ALT: 24 U/L (ref 0–44)
AST: 31 U/L (ref 15–41)
Albumin: 4.2 g/dL (ref 3.5–5.0)
Alkaline Phosphatase: 56 U/L (ref 38–126)
Anion gap: 11 (ref 5–15)
BUN: 10 mg/dL (ref 6–20)
CO2: 26 mmol/L (ref 22–32)
Calcium: 8.2 mg/dL — ABNORMAL LOW (ref 8.9–10.3)
Chloride: 102 mmol/L (ref 98–111)
Creatinine, Ser: 1.17 mg/dL (ref 0.61–1.24)
GFR, Estimated: >60 mL/min (ref >60)
Glucose, Bld: 140 mg/dL — ABNORMAL HIGH (ref 70–99)
Potassium: 3.4 mmol/L — ABNORMAL LOW (ref 3.5–5.1)
Sodium: 139 mmol/L (ref 135–145)
Total Bilirubin: 0.9 mg/dL (ref 0.3–1.2)
Total Protein: 6.9 g/dL (ref 6.5–8.1)

## 2020-07-31 LAB — TROPONIN I (HIGH SENSITIVITY)
Troponin I (High Sensitivity): 5 ng/L (ref <18)
Troponin I (High Sensitivity): 8 ng/L (ref <18)

## 2020-07-31 LAB — RESP PANEL BY RT-PCR (FLU A&B, COVID) ARPGX2
Influenza A by PCR: NEGATIVE
Influenza B by PCR: NEGATIVE
SARS Coronavirus 2 by RT PCR: NEGATIVE

## 2020-07-31 LAB — LIPASE, BLOOD: Lipase: 34 U/L (ref 11–51)

## 2020-07-31 MED ORDER — SODIUM CHLORIDE 0.9 % IV SOLN
1.0000 g | Freq: Once | INTRAVENOUS | Status: AC
  Administered 2020-07-31: 1 g via INTRAVENOUS
  Filled 2020-07-31: qty 10

## 2020-07-31 MED ORDER — DOXYCYCLINE HYCLATE 100 MG PO TABS
100.0000 mg | ORAL_TABLET | Freq: Once | ORAL | Status: AC
  Administered 2020-07-31: 100 mg via ORAL
  Filled 2020-07-31: qty 1

## 2020-07-31 MED ORDER — DOXYCYCLINE HYCLATE 100 MG PO CAPS: 100.0000 mg | ORAL_CAPSULE | Freq: Two times a day (BID) | ORAL | 0 refills | Status: AC

## 2020-07-31 MED ORDER — SODIUM CHLORIDE 0.9 % IV BOLUS
1000.0000 mL | Freq: Once | INTRAVENOUS | Status: AC
  Administered 2020-07-31: 1000 mL via INTRAVENOUS

## 2020-07-31 MED ORDER — ACETAMINOPHEN 325 MG PO TABS
650.0000 mg | ORAL_TABLET | Freq: Once | ORAL | Status: AC
  Administered 2020-07-31: 650 mg via ORAL
  Filled 2020-07-31: qty 2

## 2020-07-31 NOTE — Discharge Instructions (Addendum)
Your chest xray showed pneumonia.  You were given a prescription for antibiotics. Please take the antibiotic prescription fully.   Please follow up with your primary care provider within 5-7 days for re-evaluation of your symptoms. If you do not have a primary care provider, information for a healthcare clinic has been provided for you to make arrangements for follow up care. Please return to the emergency department for any new or worsening symptoms.

## 2020-07-31 NOTE — ED Triage Notes (Signed)
Pt BIB [facility] for weakness and SOB. Reports he was walking when his legs began to feel weak like he was going to fall. Also c/o SOB, HA and nausea. A/OX4.

## 2020-07-31 NOTE — ED Provider Notes (Addendum)
[facility] EMERGENCY DEPARTMENT Provider Note   CSN: [id number] Arrival date & time: 07/31/20  2018     History Chief Complaint  Patient presents with   Weakness   Shortness of Breath    Paul Lambert is a 45 y.o. male.  HPI   Pt is a 45 y/o male with a h/o ETOH abuse, HTN, seizures, who presents to the ED today for eval of weakness. States he was walking today and felt lightheaded and weak. States he had a syncopal episode. He is c/o a headache that he states he has had "for a minute".   He has had diarrhea, abd pain (periumbilical), cough, rhinorrhea, sore throat   Denies congestion, nausea, vomiting. He is c/o some sob and chest pain.   Reports etoh use today. Denies drug use.   Past Medical History:  Diagnosis Date   ETOH abuse    Hypertension    Seizures (HCC)     Patient Active Problem List   Diagnosis Date Noted   AKI (acute kidney injury) (HCC) 11/20/2017   Hypertension 11/20/2017   Polycythemia 11/20/2017   Alcohol intoxication with delirium (HCC) 11/20/2017   Rhabdomyolysis 11/20/2017   Seizures (HCC) 11/20/2017   Hypokalemia 11/20/2017   Alcohol dependence (HCC) 11/20/2017   Cocaine use 11/20/2017   Alcoholic ketoacidosis 11/20/2017    Past Surgical History:  Procedure Laterality Date   FACIAL COSMETIC SURGERY         Family History  Problem Relation Age of Onset   Hypertension Mother    Hypertension Father     Social History   Tobacco Use   Smoking status: Every Day    Packs/day: 0.50    Pack years: 0.00    Types: Cigarettes   Smokeless tobacco: Never  Vaping Use   Vaping Use: Never used  Substance Use Topics   Alcohol use: Yes    Alcohol/week: 40.0 - 52.0 standard drinks    Types: 24 - 36 Cans of beer, 16 Shots of liquor per week    Comment: "whatever I can"   Drug use: Not Currently    Home Medications Prior to Admission medications   Medication Sig Start Date End Date Taking? Authorizing Provider  doxycycline (VIBRAMYCIN)  100 MG capsule Take 1 capsule (100 mg total) by mouth 2 (two) times daily for 7 days. 07/31/20 08/07/20 Yes Jamilette Suchocki S, PA-C  chlordiazePOXIDE (LIBRIUM) 25 MG capsule 50mg  PO TID x 1D, then 25-50mg  PO BID X 1D, then 25-50mg  PO QD X 1D Patient not taking: Reported on 04/24/2020 04/14/20   06/14/20, MD  famotidine (PEPCID) 20 MG tablet Take 1 tablet (20 mg total) by mouth 2 (two) times daily. 04/27/19   04/29/19, MD  ibuprofen (ADVIL) 800 MG tablet Take 1 tablet (800 mg total) by mouth 3 (three) times daily. 02/14/20   04/13/20, PA-C  lisinopril (ZESTRIL) 20 MG tablet Take 2 tablets (40 mg total) by mouth daily. 04/05/20   04/07/20, MD  ondansetron (ZOFRAN ODT) 4 MG disintegrating tablet 4mg  ODT q4 hours prn nausea/vomit Patient taking differently: Take 4 mg by mouth every 4 (four) hours as needed for nausea or vomiting. 04/27/19   , MD  polyethylene glycol (MIRALAX) 17 g packet Take 17 g by mouth daily. 07/26/20   Bethann Berkshire, MD    Allergies    Onion  Review of Systems   Review of Systems  Constitutional:  Positive for fatigue. Negative for fever.  HENT:  Positive  for rhinorrhea and sore throat. Negative for congestion and ear pain.   Eyes:  Negative for pain and visual disturbance.  Respiratory:  Positive for cough and shortness of breath.   Cardiovascular:  Positive for chest pain. Negative for palpitations.  Gastrointestinal:  Positive for abdominal pain and diarrhea. Negative for nausea and vomiting.  Genitourinary:  Negative for dysuria and hematuria.  Musculoskeletal:  Negative for back pain.  Skin:  Negative for color change and rash.  Neurological:  Positive for weakness, light-headedness and headaches.  All other systems reviewed and are negative.  Physical Exam Updated Vital Signs BP (!) 144/92   Pulse (!) 105   Temp 98.9 F (37.2 C) (Oral)   Resp 20   Ht 5\' 9"  (1.753 m)   Wt 81.6 kg   SpO2 98%   BMI 26.58 kg/m   Physical  Exam Vitals and nursing note reviewed.  Constitutional:      Appearance: He is well-developed.     Comments: Smells of etoh  HENT:     Head: Normocephalic and atraumatic.     Mouth/Throat:     Mouth: Mucous membranes are dry.  Eyes:     Conjunctiva/sclera: Conjunctivae normal.  Cardiovascular:     Rate and Rhythm: Regular rhythm. Tachycardia present.     Heart sounds: Normal heart sounds. No murmur heard. Pulmonary:     Effort: Pulmonary effort is normal. No respiratory distress.     Breath sounds: Normal breath sounds.  Abdominal:     General: Bowel sounds are normal.     Palpations: Abdomen is soft.     Tenderness: There is abdominal tenderness (mild periumbilical ttp). There is no guarding or rebound.  Musculoskeletal:     Cervical back: Neck supple.  Skin:    General: Skin is warm and dry.  Neurological:     Mental Status: He is alert.     Comments: Alert, oriented. Clear speech. Cn II-XII intact. Moving all extremities with normal coordination    ED Results / Procedures / Treatments   Labs (all labs ordered are listed, but only abnormal results are displayed) Labs Reviewed  CBC WITH DIFFERENTIAL/PLATELET - Abnormal; Notable for the following components:      Result Value   WBC 12.9 (*)    Neutro Abs 10.0 (*)    Abs Immature Granulocytes 0.09 (*)    All other components within normal limits  COMPREHENSIVE METABOLIC PANEL - Abnormal; Notable for the following components:   Potassium 3.4 (*)    Glucose, Bld 140 (*)    Calcium 8.2 (*)    All other components within normal limits  URINALYSIS, ROUTINE W REFLEX MICROSCOPIC - Abnormal; Notable for the following components:   Hgb urine dipstick SMALL (*)    All other components within normal limits  RESP PANEL BY RT-PCR (FLU A&B, COVID) ARPGX2  LIPASE, BLOOD  ETHANOL  TROPONIN I (HIGH SENSITIVITY)  TROPONIN I (HIGH SENSITIVITY)    EKG EKG Interpretation  Date/Time:  Friday July 31 2020 20:32:22 EDT Ventricular  Rate:  141 PR Interval:  118 QRS Duration: 90 QT Interval:  301 QTC Calculation: 461 R Axis:   61 Text Interpretation: Sinus tachycardia Multiple ventricular premature complexes Aberrant conduction of SV complex(es) Probable left atrial enlargement Confirmed by [id number] [phone]) on 07/31/2020 10:13:39 PM  Radiology DG Chest 2 View  Result Date: 07/31/2020 CLINICAL DATA:  Cough EXAM: CHEST - 2 VIEW COMPARISON:  11/20/2017 FINDINGS: No pleural effusion. Possible patchy peripheral infiltrate in the  left lower lung. Normal heart size. No pneumothorax IMPRESSION: Possible patchy peripheral pneumonia in the left lower lung Electronically Signed   By: Jasmine Pang M.D.   On: 07/31/2020 22:08   CT Head Wo Contrast  Result Date: 07/31/2020 CLINICAL DATA:  Head trauma headache EXAM: CT HEAD WITHOUT CONTRAST TECHNIQUE: Contiguous axial images were obtained from the base of the skull through the vertex without intravenous contrast. COMPARISON:  CT brain 04/05/2020 FINDINGS: Brain: No acute territorial infarction, hemorrhage, or intracranial mass. The ventricles are nonenlarged. Vascular: No hyperdense vessels.  No unexpected calcification Skull: Normal. Negative for fracture or focal lesion. Sinuses/Orbits: No acute finding. Other: None IMPRESSION: Negative non contrasted CT appearance of the brain Electronically Signed   By: Jasmine Pang M.D.   On: 07/31/2020 21:53    Procedures Procedures   Medications Ordered in ED Medications  acetaminophen (TYLENOL) tablet 650 mg (650 mg Oral Given 07/31/20 2115)  sodium chloride 0.9 % bolus 1,000 mL (1,000 mLs Intravenous New Bag/Given 07/31/20 2116)  cefTRIAXone (ROCEPHIN) 1 g in sodium chloride 0.9 % 100 mL IVPB (1 g Intravenous New Bag/Given 07/31/20 2225)  doxycycline (VIBRA-TABS) tablet 100 mg (100 mg Oral Given 07/31/20 2225)    ED Course  I have reviewed the triage vital signs and the nursing notes.  Pertinent labs & imaging results that were  available during my care of the patient were reviewed by me and considered in my medical decision making (see chart for details).    MDM Rules/Calculators/A&P                          44 y/o M presenting for eval of generalized weakness. On eval pt is tachycardic, on recheck his temp is 100.61F on my eval.   Reviewed/interpreted labs CBC with mild leukocytosis of 12,000 CMP with mild hypokalemia, otherwise reassuring Lipase negative Trope negative UA with some hematuria COVID/flu negative ETOH  pending   EKG - sinus tachycardia Multiple ventricular premature complexes Aberrant conduction of SV complex(es) Probable left atrial enlargement  Reviewed/interpreted imaging CT head - Negative non contrasted CT appearance of the brain CXR -  Possible patchy peripheral pneumonia in the left lower lung  Pt given abx here in the ed. He is not hypoxic and is well-appearing.  Feel he is appropriate for discharge home if he can get a ride. His etoh [place] pending. He is clinically sober at this time and appears appropriate for discharge.   Final Clinical Impression(s) / ED Diagnoses Final diagnoses:  Community acquired pneumonia of right lung, unspecified part of lung    Rx / DC Orders ED Discharge Orders          Ordered    doxycycline (VIBRAMYCIN) 100 MG capsule  2 times daily        07/31/20 2236             Karrie Meres, PA-C 07/31/20 2237    Karrie Meres, PA-C 07/31/20 2316    Terald Sleeper, MD 08/01/20 1131

## 2020-08-01 LAB — ETHANOL: Alcohol, Ethyl (B): 329 mg/dL (ref <10)

## 2020-08-06 ENCOUNTER — Emergency Department (HOSPITAL_COMMUNITY)
Admission: EM | Admit: 2020-08-06 | Discharge: 2020-08-06 | Disposition: A | Discharge status: Home / Self Care | Payer: Self-pay | Attending: Student | Admitting: Student

## 2020-08-06 ENCOUNTER — Emergency Department (HOSPITAL_COMMUNITY): Payer: Self-pay

## 2020-08-06 ENCOUNTER — Encounter (HOSPITAL_COMMUNITY): Payer: Self-pay | Admitting: *Deleted

## 2020-08-06 ENCOUNTER — Other Ambulatory Visit: Payer: Self-pay

## 2020-08-06 DIAGNOSIS — Z5321 Procedure and treatment not carried out due to patient leaving prior to being seen by health care provider: Secondary | ICD-10-CM | POA: Insufficient documentation

## 2020-08-06 DIAGNOSIS — J189 Pneumonia, unspecified organism: Secondary | ICD-10-CM | POA: Insufficient documentation

## 2020-08-06 LAB — CBC WITH DIFFERENTIAL/PLATELET
Abs Immature Granulocytes: 0.01 10*3/uL (ref 0.00–0.07)
Basophils Absolute: 0.1 10*3/uL (ref 0.0–0.1)
Basophils Relative: 1 %
Eosinophils Absolute: 0.2 10*3/uL (ref 0.0–0.5)
Eosinophils Relative: 3 %
HCT: 43.5 % (ref 39.0–52.0)
Hemoglobin: 14.8 g/dL (ref 13.0–17.0)
Immature Granulocytes: 0 %
Lymphocytes Relative: 49 %
Lymphs Abs: 2.8 10*3/uL (ref 0.7–4.0)
MCH: 31.1 pg (ref 26.0–34.0)
MCHC: 34 g/dL (ref 30.0–36.0)
MCV: 91.4 fL (ref 80.0–100.0)
Monocytes Absolute: 0.4 10*3/uL (ref 0.1–1.0)
Monocytes Relative: 7 %
Neutro Abs: 2.2 10*3/uL (ref 1.7–7.7)
Neutrophils Relative %: 40 %
Platelets: 226 10*3/uL (ref 150–400)
RBC: 4.76 MIL/uL (ref 4.22–5.81)
RDW: 12.6 % (ref 11.5–15.5)
WBC: 5.6 10*3/uL (ref 4.0–10.5)
nRBC: 0 % (ref 0.0–0.2)

## 2020-08-06 LAB — COMPREHENSIVE METABOLIC PANEL
ALT: 29 U/L (ref 0–44)
AST: 29 U/L (ref 15–41)
Albumin: 4.2 g/dL (ref 3.5–5.0)
Alkaline Phosphatase: 55 U/L (ref 38–126)
Anion gap: 10 (ref 5–15)
BUN: 7 mg/dL (ref 6–20)
CO2: 25 mmol/L (ref 22–32)
Calcium: 8.7 mg/dL — ABNORMAL LOW (ref 8.9–10.3)
Chloride: 108 mmol/L (ref 98–111)
Creatinine, Ser: 1.05 mg/dL (ref 0.61–1.24)
GFR, Estimated: >60 mL/min (ref >60)
Glucose, Bld: 116 mg/dL — ABNORMAL HIGH (ref 70–99)
Potassium: 3.8 mmol/L (ref 3.5–5.1)
Sodium: 143 mmol/L (ref 135–145)
Total Bilirubin: 0.4 mg/dL (ref 0.3–1.2)
Total Protein: 7.5 g/dL (ref 6.5–8.1)

## 2020-08-06 NOTE — ED Provider Notes (Signed)
Emergency Medicine Provider Triage Evaluation Note  Paul Lambert , a 45 y.o. male  was evaluated in triage.  Pt complains of pneumonia, states he was here couple days ago was diagnosed with pneumonia, was unable to pick up his medications.  States he is having worsening fevers, chills, chest pain.  Coughing up some phlegm.  Review of Systems  Positive: Fevers, chills, cough Negative: Does not endorse abdominal pain, nausea or vomiting  Physical Exam  BP 126/89 (BP Location: Right Arm)   Pulse (!) 121   Temp 98.7 F (37.1 C) (Oral)   Resp 18   SpO2 91%  Gen:   Awake, no distress   Resp:  Normal effort  MSK:   Moves extremities without difficulty  Other:    Medical Decision Making  Medically screening exam initiated at 6:59 PM.  Appropriate orders placed.  Manuella Ghazi was informed that the remainder of the evaluation will be completed by another provider, this initial triage assessment does not replace that evaluation, and the importance of remaining in the ED until their evaluation is complete.  Patient presents with pneumonia, lab work and imaging have been ordered, patient need further work.  Emergency department.   Carroll Sage, PA-C 08/06/20 1900    Bethann Berkshire, MD 08/07/20 1038

## 2020-08-29 ENCOUNTER — Emergency Department (HOSPITAL_COMMUNITY)
Admission: EM | Admit: 2020-08-29 | Discharge: 2020-08-30 | Disposition: A | Discharge status: Home / Self Care | Payer: Self-pay | Attending: Emergency Medicine | Admitting: Emergency Medicine

## 2020-08-29 ENCOUNTER — Other Ambulatory Visit: Payer: Self-pay

## 2020-08-29 DIAGNOSIS — Z79899 Other long term (current) drug therapy: Secondary | ICD-10-CM | POA: Insufficient documentation

## 2020-08-29 DIAGNOSIS — F10920 Alcohol use, unspecified with intoxication, uncomplicated: Secondary | ICD-10-CM

## 2020-08-29 DIAGNOSIS — R109 Unspecified abdominal pain: Secondary | ICD-10-CM

## 2020-08-29 DIAGNOSIS — I10 Essential (primary) hypertension: Secondary | ICD-10-CM | POA: Insufficient documentation

## 2020-08-29 DIAGNOSIS — F17210 Nicotine dependence, cigarettes, uncomplicated: Secondary | ICD-10-CM | POA: Insufficient documentation

## 2020-08-29 DIAGNOSIS — R1084 Generalized abdominal pain: Secondary | ICD-10-CM | POA: Insufficient documentation

## 2020-08-29 DIAGNOSIS — R0789 Other chest pain: Secondary | ICD-10-CM | POA: Insufficient documentation

## 2020-08-29 DIAGNOSIS — Y908 Blood alcohol level of 240 mg/100 ml or more: Secondary | ICD-10-CM | POA: Insufficient documentation

## 2020-08-29 DIAGNOSIS — R112 Nausea with vomiting, unspecified: Secondary | ICD-10-CM | POA: Insufficient documentation

## 2020-08-29 DIAGNOSIS — F10129 Alcohol abuse with intoxication, unspecified: Secondary | ICD-10-CM | POA: Insufficient documentation

## 2020-08-30 ENCOUNTER — Encounter (HOSPITAL_COMMUNITY): Payer: Self-pay | Admitting: Emergency Medicine

## 2020-08-30 ENCOUNTER — Emergency Department (HOSPITAL_COMMUNITY): Payer: Self-pay

## 2020-08-30 LAB — BASIC METABOLIC PANEL
Anion gap: 8 (ref 5–15)
BUN: 8 mg/dL (ref 6–20)
CO2: 25 mmol/L (ref 22–32)
Calcium: 8.1 mg/dL — ABNORMAL LOW (ref 8.9–10.3)
Chloride: 109 mmol/L (ref 98–111)
Creatinine, Ser: 1.06 mg/dL (ref 0.61–1.24)
GFR, Estimated: >60 mL/min (ref >60)
Glucose, Bld: 105 mg/dL — ABNORMAL HIGH (ref 70–99)
Potassium: 3.6 mmol/L (ref 3.5–5.1)
Sodium: 142 mmol/L (ref 135–145)

## 2020-08-30 LAB — HEPATIC FUNCTION PANEL
ALT: 21 U/L (ref 0–44)
AST: 24 U/L (ref 15–41)
Albumin: 4 g/dL (ref 3.5–5.0)
Alkaline Phosphatase: 55 U/L (ref 38–126)
Bilirubin, Direct: <0.1 mg/dL (ref 0.0–0.2)
Total Bilirubin: 0.6 mg/dL (ref 0.3–1.2)
Total Protein: 7 g/dL (ref 6.5–8.1)

## 2020-08-30 LAB — ETHANOL: Alcohol, Ethyl (B): 368 mg/dL (ref <10)

## 2020-08-30 LAB — CBC
HCT: 41.9 % (ref 39.0–52.0)
Hemoglobin: 14.3 g/dL (ref 13.0–17.0)
MCH: 31.8 pg (ref 26.0–34.0)
MCHC: 34.1 g/dL (ref 30.0–36.0)
MCV: 93.1 fL (ref 80.0–100.0)
Platelets: 300 10*3/uL (ref 150–400)
RBC: 4.5 MIL/uL (ref 4.22–5.81)
RDW: 14.2 % (ref 11.5–15.5)
WBC: 5.1 10*3/uL (ref 4.0–10.5)
nRBC: 0 % (ref 0.0–0.2)

## 2020-08-30 LAB — TROPONIN I (HIGH SENSITIVITY)
Troponin I (High Sensitivity): 5 ng/L (ref <18)
Troponin I (High Sensitivity): 6 ng/L (ref <18)

## 2020-08-30 LAB — LIPASE, BLOOD: Lipase: 34 U/L (ref 11–51)

## 2020-08-30 MED ORDER — ALUM & MAG HYDROXIDE-SIMETH 200-200-20 MG/5ML PO SUSP
30.0000 mL | Freq: Once | ORAL | Status: AC
  Administered 2020-08-30: 30 mL via ORAL
  Filled 2020-08-30: qty 30

## 2020-08-30 MED ORDER — LIDOCAINE VISCOUS HCL 2 % MT SOLN
15.0000 mL | Freq: Once | OROMUCOSAL | Status: AC
  Administered 2020-08-30: 15 mL via ORAL
  Filled 2020-08-30: qty 15

## 2020-08-30 MED ORDER — ONDANSETRON HCL 4 MG/2ML IJ SOLN
4.0000 mg | Freq: Once | INTRAMUSCULAR | Status: AC
  Administered 2020-08-30: 4 mg via INTRAVENOUS
  Filled 2020-08-30: qty 2

## 2020-08-30 MED ORDER — SODIUM CHLORIDE 0.9 % IV BOLUS
1000.0000 mL | Freq: Once | INTRAVENOUS | Status: AC
  Administered 2020-08-30: 1000 mL via INTRAVENOUS

## 2020-08-30 MED ORDER — OMEPRAZOLE 20 MG PO CPDR: 20.0000 mg | DELAYED_RELEASE_CAPSULE | Freq: Every day | ORAL | 0 refills | Status: DC

## 2020-08-30 NOTE — ED Triage Notes (Signed)
Pt c/o chest pain for "awhile" and a sore throat that started today. Pt states he has been drinking ETOH tonight.

## 2020-08-30 NOTE — Discharge Instructions (Addendum)
There is no evidence of heart attack or blood clot in the lung.  Follow-up with your doctor.  Avoid alcohol, caffeine, spicy foods, NSAID medications.  Return to the ED with worsening chest pain, abdominal pain, vomiting, difficulty breathing or any other concerns.

## 2020-08-30 NOTE — ED Provider Notes (Signed)
[facility] Provider Note   CSN: [id number] Arrival date & time: 08/29/20  2357     History Chief Complaint  Patient presents with   Chest Pain    [facility] is a 45 y.o. male.  Level 5 caveat for intoxication.  Patient here with abdominal pain contrary to the triage note.  States he just developed chest pain when he arrived here.  States he has had diffuse abdominal pain ongoing for 2 or 3 weeks that has been sore and constant and crampy.  He was seen in this ED on June 19 with similar symptoms and had a CT scan.  Diffuse crampy abdominal pain associated with intermittent nausea and vomiting.  States he has been constipated but did have a bowel movement earlier today.  Denies any black or bloody stools.  Continues to drink alcohol.  States he has intermittent bowel movements are regular.  Just prior to arrival he developed pain in the center of his chest that is constant does not radiate.  No associated shortness of breath, cough or fever.  No sweating, nausea, vomiting or diaphoresis. Continues to drink alcohol.  Has also used cocaine in the past. No previous abdominal surgeries. Also complaining of a sore throat has been ongoing for several weeks as well. No difficulty swallowing or difficulty breathing  The history is provided by the patient.  Chest Pain     Past Medical History:  Diagnosis Date   ETOH abuse    Hypertension    Seizures (HCC)     Patient Active Problem List   Diagnosis Date Noted   AKI (acute kidney injury) (HCC) 11/20/2017   Hypertension 11/20/2017   Polycythemia 11/20/2017   Alcohol intoxication with delirium (HCC) 11/20/2017   Rhabdomyolysis 11/20/2017   Seizures (HCC) 11/20/2017   Hypokalemia 11/20/2017   Alcohol dependence (HCC) 11/20/2017   Cocaine use 11/20/2017   Alcoholic ketoacidosis 11/20/2017    Past Surgical History:  Procedure Laterality Date   FACIAL COSMETIC SURGERY         Family History  Problem  Relation Age of Onset   Hypertension Mother    Hypertension Father     Social History   Tobacco Use   Smoking status: Every Day    Packs/day: 0.50    Types: Cigarettes   Smokeless tobacco: Never  Vaping Use   Vaping Use: Never used  Substance Use Topics   Alcohol use: Yes    Alcohol/week: 40.0 - 52.0 standard drinks    Types: 24 - 36 Cans of beer, 16 Shots of liquor per week    Comment: "whatever I can"   Drug use: Not Currently    Home Medications Prior to Admission medications   Medication Sig Start Date End Date Taking? Authorizing Provider  chlordiazePOXIDE (LIBRIUM) 25 MG capsule 50mg  PO TID x 1D, then 25-50mg  PO BID X 1D, then 25-50mg  PO QD X 1D Patient not taking: Reported on 04/24/2020 04/14/20   06/14/20, MD  famotidine (PEPCID) 20 MG tablet Take 1 tablet (20 mg total) by mouth 2 (two) times daily. 04/27/19   04/29/19, MD  ibuprofen (ADVIL) 800 MG tablet Take 1 tablet (800 mg total) by mouth 3 (three) times daily. 02/14/20   04/13/20, PA-C  lisinopril (ZESTRIL) 20 MG tablet Take 2 tablets (40 mg total) by mouth daily. 04/05/20   04/07/20, MD  ondansetron (ZOFRAN ODT) 4 MG disintegrating tablet 4mg  ODT q4 hours prn nausea/vomit Patient taking differently: Take 4  mg by mouth every 4 (four) hours as needed for nausea or vomiting. 04/27/19   Bethann Berkshire, MD  polyethylene glycol (MIRALAX) 17 g packet Take 17 g by mouth daily. 07/26/20   Elizabella Nolet, Jeannett Senior, MD    Allergies    Onion  Review of Systems   Review of Systems  Unable to perform ROS: Mental status change  Cardiovascular:  Positive for chest pain.   Physical Exam Updated Vital Signs BP (!) 136/98 (BP Location: Left Arm)   Pulse (!) 110   Temp 99 F (37.2 C) (Oral)   Resp 18   Ht 5\' 9"  (1.753 m)   Wt 117.9 kg   SpO2 99%   BMI 38.40 kg/m   Physical Exam Vitals and nursing note reviewed.  Constitutional:      General: He is not in acute distress.    Appearance: He is well-developed.      Comments: Slurring words, intoxicated  HENT:     Head: Normocephalic and atraumatic.     Mouth/Throat:     Pharynx: No oropharyngeal exudate.     Comments: Mild erythema to oropharynx, no asymmetry, uvula is midline Eyes:     Conjunctiva/sclera: Conjunctivae normal.     Pupils: Pupils are equal, round, and reactive to light.  Neck:     Comments: No meningismus. Cardiovascular:     Rate and Rhythm: Normal rate and regular rhythm.     Heart sounds: Normal heart sounds. No murmur heard. Pulmonary:     Effort: Pulmonary effort is normal. No respiratory distress.     Breath sounds: Normal breath sounds.  Abdominal:     Palpations: Abdomen is soft.     Tenderness: There is abdominal tenderness. There is no guarding or rebound.     Comments: Diffuse tenderness with voluntary guarding, pushes examining hand away  Musculoskeletal:        General: No tenderness. Normal range of motion.     Cervical back: Normal range of motion and neck supple.  Skin:    General: Skin is warm.     Capillary Refill: Capillary refill takes less than 2 seconds.  Neurological:     General: No focal deficit present.     Mental Status: He is alert and oriented to person, place, and time. Mental status is at baseline.     Cranial Nerves: No cranial nerve deficit.     Motor: No abnormal muscle tone.     Coordination: Coordination normal.     Comments:  5/5 strength throughout. CN 2-12 intact.Equal grip strength.   Psychiatric:        Behavior: Behavior normal.    ED Results / Procedures / Treatments   Labs (all labs ordered are listed, but only abnormal results are displayed) Labs Reviewed  BASIC METABOLIC PANEL - Abnormal; Notable for the following components:      Result Value   Glucose, Bld 105 (*)    Calcium 8.1 (*)    All other components within normal limits  ETHANOL - Abnormal; Notable for the following components:   Alcohol, Ethyl (B) 368 (*)    All other components within normal limits   GROUP A STREP BY PCR  CBC  HEPATIC FUNCTION PANEL  LIPASE, BLOOD  TROPONIN I (HIGH SENSITIVITY)  TROPONIN I (HIGH SENSITIVITY)    EKG EKG Interpretation  Date/Time:  Sunday August 30 2020 00:08:30 EDT Ventricular Rate:  109 PR Interval:  142 QRS Duration: 95 QT Interval:  344 QTC Calculation: 464 R Axis:  77 Text Interpretation: Sinus tachycardia Probable anteroseptal infarct, old No significant change was found Confirmed by Glynn Octave [phone]) on 08/30/2020 12:20:37 AM  Radiology DG Chest Portable 1 View  Result Date: 08/30/2020 CLINICAL DATA:  Chest pain EXAM: PORTABLE CHEST 1 VIEW COMPARISON:  None. FINDINGS: The heart size and mediastinal contours are within normal limits. Both lungs are clear. The visualized skeletal structures are unremarkable. IMPRESSION: No active disease. Electronically Signed   By: Deatra Robinson M.D.   On: 08/30/2020 00:43   DG Abd 2 Views  Result Date: 08/30/2020 CLINICAL DATA:  Abdominal pain and nausea EXAM: ABDOMEN - 2 VIEW COMPARISON:  Radiograph 07/26/2020 FINDINGS: No high-grade obstructive bowel gas pattern is seen. No suspicious abdominal calcifications. Lung bases and cardiomediastinal contours are unremarkable. Osseous structures are free of acute or worrisome abnormality. Mild-to-moderate degenerative changes bilateral hips, similar to priors. Few phleboliths noted in the deep pelvis. IMPRESSION: No high-grade obstructive in or other acute abdominal radiographic abnormality. Electronically Signed   By: Kreg Shropshire M.D.   On: 08/30/2020 01:50    Procedures Procedures   Medications Ordered in ED Medications  alum & mag hydroxide-simeth (MAALOX/MYLANTA) 200-200-20 MG/5ML suspension 30 mL (has no administration in time range)    And  lidocaine (XYLOCAINE) 2 % viscous mouth solution 15 mL (has no administration in time range)  sodium chloride 0.9 % bolus 1,000 mL (has no administration in time range)  ondansetron (ZOFRAN) injection 4 mg  (has no administration in time range)    ED Course  I have reviewed the triage vital signs and the nursing notes.  Pertinent labs & imaging results that were available during my care of the patient were reviewed by me and considered in my medical decision making (see chart for details).    MDM Rules/Calculators/A&P                           Intoxicated patient here with abdominal pain ongoing for several weeks with constipation.  No chest pain on arrival here.  EKG is sinus tachycardia without acute ST changes. Abdomen soft but diffusely tender.  Chest x-ray is negative.  Labs are significant for alcohol intoxication. Normal LFTs and lipase.  Troponin is negative.  Low suspicion for ACS GI cocktail given.  Work-up reassuring.  Troponin negative x2.  Patient allowed to sober in the ED and resting comfortably on recheck.  Acute abdominal series is negative.  On recheck his abdomen is soft and nontender.  He is able to tolerate p.o. and ambulate.  Low suspicion for ACS, pulmonary embolism, aortic dissection. Likely some element of alcoholic gastritis.  Will initiate PPI.  Avoid alcohol, caffeine, NSAIDs, spicy foods.  Patiently able to be discharged when he has a sober ride. Final Clinical Impression(s) / ED Diagnoses Final diagnoses:  Abdominal pain  Alcoholic intoxication without complication (HCC)  Atypical chest pain    Rx / DC Orders ED Discharge Orders     None        Lakaisha Danish, Jeannett Senior, MD 08/30/20 [phone]

## 2020-09-12 ENCOUNTER — Emergency Department (HOSPITAL_COMMUNITY): Payer: Self-pay

## 2020-09-12 ENCOUNTER — Emergency Department (HOSPITAL_COMMUNITY)
Admission: EM | Admit: 2020-09-12 | Discharge: 2020-09-13 | Disposition: A | Discharge status: Home / Self Care | Payer: Self-pay | Attending: Emergency Medicine | Admitting: Emergency Medicine

## 2020-09-12 ENCOUNTER — Encounter (HOSPITAL_COMMUNITY): Payer: Self-pay | Admitting: *Deleted

## 2020-09-12 ENCOUNTER — Other Ambulatory Visit: Payer: Self-pay

## 2020-09-12 DIAGNOSIS — Y908 Blood alcohol level of 240 mg/100 ml or more: Secondary | ICD-10-CM | POA: Insufficient documentation

## 2020-09-12 DIAGNOSIS — R0789 Other chest pain: Secondary | ICD-10-CM | POA: Insufficient documentation

## 2020-09-12 DIAGNOSIS — I10 Essential (primary) hypertension: Secondary | ICD-10-CM | POA: Insufficient documentation

## 2020-09-12 DIAGNOSIS — F17210 Nicotine dependence, cigarettes, uncomplicated: Secondary | ICD-10-CM | POA: Insufficient documentation

## 2020-09-12 DIAGNOSIS — R112 Nausea with vomiting, unspecified: Secondary | ICD-10-CM | POA: Insufficient documentation

## 2020-09-12 DIAGNOSIS — R1084 Generalized abdominal pain: Secondary | ICD-10-CM | POA: Insufficient documentation

## 2020-09-12 DIAGNOSIS — Z79899 Other long term (current) drug therapy: Secondary | ICD-10-CM | POA: Insufficient documentation

## 2020-09-12 DIAGNOSIS — F10129 Alcohol abuse with intoxication, unspecified: Secondary | ICD-10-CM | POA: Insufficient documentation

## 2020-09-12 DIAGNOSIS — F10920 Alcohol use, unspecified with intoxication, uncomplicated: Secondary | ICD-10-CM

## 2020-09-12 LAB — CBC
HCT: 43.8 % (ref 39.0–52.0)
Hemoglobin: 14.8 g/dL (ref 13.0–17.0)
MCH: 31.8 pg (ref 26.0–34.0)
MCHC: 33.8 g/dL (ref 30.0–36.0)
MCV: 94 fL (ref 80.0–100.0)
Platelets: 198 10*3/uL (ref 150–400)
RBC: 4.66 MIL/uL (ref 4.22–5.81)
RDW: 14.6 % (ref 11.5–15.5)
WBC: 6.1 10*3/uL (ref 4.0–10.5)
nRBC: 0 % (ref 0.0–0.2)

## 2020-09-12 LAB — BASIC METABOLIC PANEL
Anion gap: 10 (ref 5–15)
BUN: 9 mg/dL (ref 6–20)
CO2: 22 mmol/L (ref 22–32)
Calcium: 8.2 mg/dL — ABNORMAL LOW (ref 8.9–10.3)
Chloride: 107 mmol/L (ref 98–111)
Creatinine, Ser: 1.21 mg/dL (ref 0.61–1.24)
GFR, Estimated: >60 mL/min (ref >60)
Glucose, Bld: 145 mg/dL — ABNORMAL HIGH (ref 70–99)
Potassium: 3.3 mmol/L — ABNORMAL LOW (ref 3.5–5.1)
Sodium: 139 mmol/L (ref 135–145)

## 2020-09-12 LAB — TROPONIN I (HIGH SENSITIVITY): Troponin I (High Sensitivity): 6 ng/L (ref <18)

## 2020-09-12 MED ORDER — SODIUM CHLORIDE 0.9 % IV BOLUS
1000.0000 mL | Freq: Once | INTRAVENOUS | Status: AC
  Administered 2020-09-12: 1000 mL via INTRAVENOUS

## 2020-09-12 NOTE — ED Triage Notes (Signed)
Pt with chest pain across chest for past 2 days.

## 2020-09-12 NOTE — ED Provider Notes (Signed)
[facility] EMERGENCY DEPARTMENT Provider Note   CSN: [id number] Arrival date & time: 09/12/20  2140     History Chief Complaint  Patient presents with   Chest Pain    [facility] is a 45 y.o. male.  Level 5 caveat for alcohol intoxication.  Patient brought in with chest pain ongoing for several days.  He cannot give much of a history as he is heavily intoxicated.  Reports he has had ongoing chest pain for "more than 2 days".  Describes burning across his anterior chest.  Associate with intermittent nausea and vomiting.  No shortness of breath, cough or fever.  No diaphoresis.  Unknown cardiac history.  Denies any recent cocaine use.  States he had several alcoholic drinks today. Denies any radiation of the pain to his back, arms or neck.  He denies any cough or fever. History unreliable due to his intoxication  The history is provided by the patient.  Chest Pain     Past Medical History:  Diagnosis Date   ETOH abuse    Hypertension    Seizures (HCC)     Patient Active Problem List   Diagnosis Date Noted   AKI (acute kidney injury) (HCC) 11/20/2017   Hypertension 11/20/2017   Polycythemia 11/20/2017   Alcohol intoxication with delirium (HCC) 11/20/2017   Rhabdomyolysis 11/20/2017   Seizures (HCC) 11/20/2017   Hypokalemia 11/20/2017   Alcohol dependence (HCC) 11/20/2017   Cocaine use 11/20/2017   Alcoholic ketoacidosis 11/20/2017    Past Surgical History:  Procedure Laterality Date   FACIAL COSMETIC SURGERY         Family History  Problem Relation Age of Onset   Hypertension Mother    Hypertension Father     Social History   Tobacco Use   Smoking status: Every Day    Packs/day: 0.50    Types: Cigarettes   Smokeless tobacco: Never  Vaping Use   Vaping Use: Never used  Substance Use Topics   Alcohol use: Yes    Alcohol/week: 40.0 - 52.0 standard drinks    Types: 24 - 36 Cans of beer, 16 Shots of liquor per week    Comment: "whatever I can"   Drug  use: Not Currently    Home Medications Prior to Admission medications   Medication Sig Start Date End Date Taking? Authorizing Provider  chlordiazePOXIDE (LIBRIUM) 25 MG capsule 50mg  PO TID x 1D, then 25-50mg  PO BID X 1D, then 25-50mg  PO QD X 1D Patient not taking: Reported on 04/24/2020 04/14/20   06/14/20, MD  famotidine (PEPCID) 20 MG tablet Take 1 tablet (20 mg total) by mouth 2 (two) times daily. 04/27/19   04/29/19, MD  ibuprofen (ADVIL) 800 MG tablet Take 1 tablet (800 mg total) by mouth 3 (three) times daily. 02/14/20   04/13/20, PA-C  lisinopril (ZESTRIL) 20 MG tablet Take 2 tablets (40 mg total) by mouth daily. 04/05/20   04/07/20, MD  omeprazole (PRILOSEC) 20 MG capsule Take 1 capsule (20 mg total) by mouth daily. 08/30/20   Merita Hawks, 09/01/20, MD  ondansetron (ZOFRAN ODT) 4 MG disintegrating tablet 4mg  ODT q4 hours prn nausea/vomit Patient taking differently: Take 4 mg by mouth every 4 (four) hours as needed for nausea or vomiting. 04/27/19   , MD  polyethylene glycol (MIRALAX) 17 g packet Take 17 g by mouth daily. 07/26/20   Bethann Berkshire, MD    Allergies    Onion  Review of Systems   Review  of Systems  Unable to perform ROS: Other  Cardiovascular:  Positive for chest pain.   Physical Exam Updated Vital Signs BP 118/84   Pulse 86   Temp 97.9 F (36.6 C) (Oral)   Resp 19   Ht 5\' 9"  (1.753 m)   SpO2 96%   BMI 38.40 kg/m   Physical Exam Vitals and nursing note reviewed.  Constitutional:      General: He is not in acute distress.    Appearance: He is well-developed.     Comments: Somnolent, arouses to voice and mumbles a few words  HENT:     Head: Normocephalic and atraumatic.     Mouth/Throat:     Pharynx: No oropharyngeal exudate.  Eyes:     Conjunctiva/sclera: Conjunctivae normal.     Pupils: Pupils are equal, round, and reactive to light.  Neck:     Comments: No meningismus. Cardiovascular:     Rate and Rhythm: Normal rate  and regular rhythm.     Heart sounds: Normal heart sounds. No murmur heard. Pulmonary:     Effort: Pulmonary effort is normal. No respiratory distress.     Breath sounds: Normal breath sounds.  Chest:     Chest wall: Tenderness present.  Abdominal:     Palpations: Abdomen is soft.     Tenderness: There is abdominal tenderness. There is no guarding or rebound.     Comments: Diffuse tenderness with voluntary guarding  Musculoskeletal:        General: No tenderness. Normal range of motion.     Cervical back: Normal range of motion and neck supple.  Skin:    General: Skin is warm.  Neurological:     Mental Status: He is alert and oriented to person, place, and time.     Cranial Nerves: No cranial nerve deficit.     Motor: No abnormal muscle tone.     Coordination: Coordination normal.     Comments:  5/5 strength throughout. CN 2-12 intact.Equal grip strength.   Psychiatric:        Behavior: Behavior normal.    ED Results / Procedures / Treatments   Labs (all labs ordered are listed, but only abnormal results are displayed) Labs Reviewed  BASIC METABOLIC PANEL - Abnormal; Notable for the following components:      Result Value   Potassium 3.3 (*)    Glucose, Bld 145 (*)    Calcium 8.2 (*)    All other components within normal limits  CBC  HEPATIC FUNCTION PANEL  LIPASE, BLOOD  ETHANOL  TROPONIN I (HIGH SENSITIVITY)  TROPONIN I (HIGH SENSITIVITY)    EKG None  Radiology DG Chest 2 View  Result Date: 09/12/2020 CLINICAL DATA:  Chest pain for 2 days EXAM: CHEST - 2 VIEW COMPARISON:  Radiograph 08/30/2020 FINDINGS: No consolidation, features of edema, pneumothorax, or effusion. Pulmonary vascularity is normally distributed. The cardiomediastinal contours are unremarkable. No acute osseous or soft tissue abnormality. IMPRESSION: No acute cardiopulmonary abnormality. Electronically Signed   By: 09/01/2020 M.D.   On: 09/12/2020 22:16    Procedures Procedures   Medications  Ordered in ED Medications  sodium chloride 0.9 % bolus 1,000 mL (1,000 mLs Intravenous New Bag/Given 09/12/20 2308)    ED Course  I have reviewed the triage vital signs and the nursing notes.  Pertinent labs & imaging results that were available during my care of the patient were reviewed by me and considered in my medical decision making (see chart for details).  MDM Rules/Calculators/A&P                           Intoxication with chest pain.  Unable to give a reasonable history.  EKG without acute ischemia.  Labs are reassuring, LFTs and lipase are normal.  Troponin was negative.  Patient allowed to sober in the ED.  At 3 AM, he is awake and alert.  He does not member how he came to the hospital.  States he has been having chest pain on and off for quite some time but worse over the past 2 days lasting for several hours at a time.  Denies any radiation of the pain.  States he is pain-free now.  Troponin is negative x2.  Low suspicion for ACS or PE Low suspicion for ACS, pulmonary embolism, aortic dissection. Likely some element of alcoholic gastritis.  Will initiate PPI.  Avoid alcohol, caffeine, NSAIDs, spicy foods.   Patient will be able to be discharged when he has a sober ride. Final Clinical Impression(s) / ED Diagnoses Final diagnoses:  Atypical chest pain  Alcoholic intoxication without complication [facility]    Rx / DC Orders ED Discharge Orders     None        Karlina Suares, Jeannett Senior, MD 09/13/20 0505

## 2020-09-13 LAB — TROPONIN I (HIGH SENSITIVITY): Troponin I (High Sensitivity): 7 ng/L (ref <18)

## 2020-09-13 LAB — HEPATIC FUNCTION PANEL
ALT: 25 U/L (ref 0–44)
AST: 33 U/L (ref 15–41)
Albumin: 3.9 g/dL (ref 3.5–5.0)
Alkaline Phosphatase: 55 U/L (ref 38–126)
Bilirubin, Direct: <0.1 mg/dL (ref 0.0–0.2)
Total Bilirubin: 0.7 mg/dL (ref 0.3–1.2)
Total Protein: 7 g/dL (ref 6.5–8.1)

## 2020-09-13 LAB — ETHANOL: Alcohol, Ethyl (B): 288 mg/dL — ABNORMAL HIGH (ref <10)

## 2020-09-13 LAB — LIPASE, BLOOD: Lipase: 38 U/L (ref 11–51)

## 2020-09-13 NOTE — Discharge Instructions (Addendum)
There is no evidence of heart attack.  Reduce your alcohol intake.  Follow-up with your primary care doctor.  Take the stomach medication as prescribed.  Return to the ED with new or worsening symptoms.

## 2020-09-28 ENCOUNTER — Encounter (HOSPITAL_COMMUNITY): Payer: Self-pay | Admitting: *Deleted

## 2020-09-28 ENCOUNTER — Emergency Department (HOSPITAL_COMMUNITY)
Admission: EM | Admit: 2020-09-28 | Discharge: 2020-09-28 | Disposition: A | Discharge status: Home / Self Care | Payer: Self-pay | Attending: Emergency Medicine | Admitting: Emergency Medicine

## 2020-09-28 DIAGNOSIS — G8929 Other chronic pain: Secondary | ICD-10-CM | POA: Insufficient documentation

## 2020-09-28 DIAGNOSIS — F17210 Nicotine dependence, cigarettes, uncomplicated: Secondary | ICD-10-CM | POA: Insufficient documentation

## 2020-09-28 DIAGNOSIS — Z79899 Other long term (current) drug therapy: Secondary | ICD-10-CM | POA: Insufficient documentation

## 2020-09-28 DIAGNOSIS — F1010 Alcohol abuse, uncomplicated: Secondary | ICD-10-CM | POA: Insufficient documentation

## 2020-09-28 DIAGNOSIS — I10 Essential (primary) hypertension: Secondary | ICD-10-CM | POA: Insufficient documentation

## 2020-09-28 DIAGNOSIS — R0781 Pleurodynia: Secondary | ICD-10-CM | POA: Insufficient documentation

## 2020-09-28 DIAGNOSIS — R079 Chest pain, unspecified: Secondary | ICD-10-CM

## 2020-09-28 NOTE — ED Provider Notes (Signed)
[facility] EMERGENCY DEPARTMENT Provider Note  CSN: [id number] Arrival date & time: 09/28/20 1438    History Chief Complaint  Patient presents with   Alcohol Problem    [facility] is a 45 y.o. male brought to the ED via EMS intoxicated, unclear why he called EMS in Triage with but on his evaluation in an Exam Room several hours later he is complaining of chronic pleuritic chest pain ongoing for 2 months since he was diagnosed with pneumonia during an ED visit. He has had several subsequent ED visits for similar with negative workups including clear CXR. He denies any fever. Has not been to see PCP. Continue to drink alcohol regularly.    Past Medical History:  Diagnosis Date   ETOH abuse    Hypertension    Seizures (HCC)     Past Surgical History:  Procedure Laterality Date   FACIAL COSMETIC SURGERY      Family History  Problem Relation Age of Onset   Hypertension Mother    Hypertension Father     Social History   Tobacco Use   Smoking status: Every Day    Packs/day: 0.50    Types: Cigarettes   Smokeless tobacco: Never  Vaping Use   Vaping Use: Never used  Substance Use Topics   Alcohol use: Yes    Alcohol/week: 40.0 - 52.0 standard drinks    Types: 24 - 36 Cans of beer, 16 Shots of liquor per week    Comment: "whatever I can"   Drug use: Not Currently     Home Medications Prior to Admission medications   Medication Sig Start Date End Date Taking? Authorizing Provider  chlordiazePOXIDE (LIBRIUM) 25 MG capsule 50mg  PO TID x 1D, then 25-50mg  PO BID X 1D, then 25-50mg  PO QD X 1D Patient not taking: Reported on 04/24/2020 04/14/20   06/14/20, MD  famotidine (PEPCID) 20 MG tablet Take 1 tablet (20 mg total) by mouth 2 (two) times daily. 04/27/19   04/29/19, MD  ibuprofen (ADVIL) 800 MG tablet Take 1 tablet (800 mg total) by mouth 3 (three) times daily. 02/14/20   04/13/20, PA-C  lisinopril (ZESTRIL) 20 MG tablet Take 2 tablets (40 mg total) by mouth  daily. 04/05/20   04/07/20, MD  omeprazole (PRILOSEC) 20 MG capsule Take 1 capsule (20 mg total) by mouth daily. 08/30/20   Rancour, 09/01/20, MD  ondansetron (ZOFRAN ODT) 4 MG disintegrating tablet 4mg  ODT q4 hours prn nausea/vomit Patient taking differently: Take 4 mg by mouth every 4 (four) hours as needed for nausea or vomiting. 04/27/19   , MD  polyethylene glycol (MIRALAX) 17 g packet Take 17 g by mouth daily. 07/26/20   Rancour, Bethann Berkshire, MD     Allergies    Onion   Review of Systems   Review of Systems A comprehensive review of systems was completed and negative except as noted in HPI.    Physical Exam BP (!) 131/92 (BP Location: Right Arm)   Pulse (!) 103   Temp 98.7 F (37.1 C) (Oral)   Resp 18   SpO2 94%   Physical Exam Vitals and nursing note reviewed.  Constitutional:      Appearance: Normal appearance.  HENT:     Head: Normocephalic and atraumatic.     Nose: Nose normal.     Mouth/Throat:     Mouth: Mucous membranes are moist.  Eyes:     Extraocular Movements: Extraocular movements intact.  Conjunctiva/sclera: Conjunctivae normal.  Cardiovascular:     Rate and Rhythm: Normal rate.  Pulmonary:     Effort: Pulmonary effort is normal.     Breath sounds: Normal breath sounds. No stridor. No wheezing.  Chest:     Chest wall: Tenderness present.  Abdominal:     General: Abdomen is flat.     Palpations: Abdomen is soft.     Tenderness: There is no abdominal tenderness.  Musculoskeletal:        General: No swelling. Normal range of motion.     Cervical back: Neck supple.  Skin:    General: Skin is warm and dry.  Neurological:     General: No focal deficit present.     Mental Status: He is alert.  Psychiatric:        Mood and Affect: Mood normal.     ED Results / Procedures / Treatments   Labs (all labs ordered are listed, but only abnormal results are displayed) Labs Reviewed - No data to display  EKG None  Radiology No  results found.  Procedures Procedures  Medications Ordered in the ED Medications - No data to display   MDM Rules/Calculators/A&P MDM  Patient with benign exam, chronic pleuritic pain. Advised to stop drinking alcohol, establish with PCP for long term management of chronic conditions.  ED Course  I have reviewed the triage vital signs and the nursing notes.  Pertinent labs & imaging results that were available during my care of the patient were reviewed by me and considered in my medical decision making (see chart for details).     Final Clinical Impression(s) / ED Diagnoses Final diagnoses:  Chronic chest pain  Alcohol abuse    Rx / DC Orders ED Discharge Orders     None        Pollyann Savoy, MD 09/28/20 1944

## 2020-09-28 NOTE — ED Triage Notes (Signed)
BROUGHT IN BY ems for alcohol intoxication

## 2021-01-09 ENCOUNTER — Other Ambulatory Visit: Payer: Self-pay

## 2021-01-09 ENCOUNTER — Ambulatory Visit
Admission: EM | Admit: 2021-01-09 | Discharge: 2021-01-09 | Disposition: A | Discharge status: Home / Self Care | Payer: Self-pay | Attending: Physician Assistant | Admitting: Physician Assistant

## 2021-01-09 DIAGNOSIS — I10 Essential (primary) hypertension: Secondary | ICD-10-CM

## 2021-01-09 DIAGNOSIS — R519 Headache, unspecified: Secondary | ICD-10-CM

## 2021-01-09 MED ORDER — LISINOPRIL 20 MG PO TABS: 40.0000 mg | ORAL_TABLET | Freq: Every day | ORAL | 3 refills | Status: DC

## 2021-01-09 MED ORDER — KETOROLAC TROMETHAMINE 30 MG/ML IJ SOLN
30.0000 mg | Freq: Once | INTRAMUSCULAR | Status: AC
  Administered 2021-01-09: 30 mg via INTRAMUSCULAR

## 2021-01-09 MED ORDER — ONDANSETRON 4 MG PO TBDP
4.0000 mg | ORAL_TABLET | Freq: Once | ORAL | Status: AC
  Administered 2021-01-09: 4 mg via ORAL

## 2021-01-09 NOTE — ED Provider Notes (Signed)
[facility]    CSN: [id number] Arrival date & time: 01/09/21  1247      History   Chief Complaint No chief complaint on file.   HPI Paul Lambert is a 45 y.o. male.   Pt complains of headache that has been occurring on and off for a few weeks.  He reports some nausea.  Denies photophobia, visual changes, chest pain, shortness of breath, lower extremity swelling.  He has been diagnosed with HTN in the past and reports he has been out of medications.  Feels that is why his head is hurting.  Will refill today.    Past Medical History:  Diagnosis Date   ETOH abuse    Hypertension    Seizures (HCC)     Patient Active Problem List   Diagnosis Date Noted   AKI (acute kidney injury) (HCC) 11/20/2017   Hypertension 11/20/2017   Polycythemia 11/20/2017   Alcohol intoxication with delirium (HCC) 11/20/2017   Rhabdomyolysis 11/20/2017   Seizures (HCC) 11/20/2017   Hypokalemia 11/20/2017   Alcohol dependence (HCC) 11/20/2017   Cocaine use 11/20/2017   Alcoholic ketoacidosis 11/20/2017    Past Surgical History:  Procedure Laterality Date   FACIAL COSMETIC SURGERY         Home Medications    Prior to Admission medications   Medication Sig Start Date End Date Taking? Authorizing Provider  chlordiazePOXIDE (LIBRIUM) 25 MG capsule 50mg  PO TID x 1D, then 25-50mg  PO BID X 1D, then 25-50mg  PO QD X 1D Patient not taking: Reported on 04/24/2020 04/14/20   06/14/20, MD  famotidine (PEPCID) 20 MG tablet Take 1 tablet (20 mg total) by mouth 2 (two) times daily. 04/27/19   04/29/19, MD  ibuprofen (ADVIL) 800 MG tablet Take 1 tablet (800 mg total) by mouth 3 (three) times daily. 02/14/20   04/13/20, PA-C  lisinopril (ZESTRIL) 20 MG tablet Take 2 tablets (40 mg total) by mouth daily. 01/09/21   Ward, 14/3/22, PA-C  omeprazole (PRILOSEC) 20 MG capsule Take 1 capsule (20 mg total) by mouth daily. 08/30/20   Rancour, 09/01/20, MD  ondansetron (ZOFRAN ODT) 4 MG  disintegrating tablet 4mg  ODT q4 hours prn nausea/vomit Patient taking differently: Take 4 mg by mouth every 4 (four) hours as needed for nausea or vomiting. 04/27/19   , MD  polyethylene glycol (MIRALAX) 17 g packet Take 17 g by mouth daily. 07/26/20   Bethann Berkshire, MD    Family History Family History  Problem Relation Age of Onset   Hypertension Mother    Hypertension Father     Social History Social History   Tobacco Use   Smoking status: Every Day    Packs/day: 0.50    Types: Cigarettes   Smokeless tobacco: Never  Vaping Use   Vaping Use: Never used  Substance Use Topics   Alcohol use: Yes    Alcohol/week: 40.0 - 52.0 standard drinks    Types: 24 - 36 Cans of beer, 16 Shots of liquor per week    Comment: "whatever I can"   Drug use: Not Currently     Allergies   Onion   Review of Systems Review of Systems  Constitutional:  Negative for chills and fever.  HENT:  Negative for ear pain and sore throat.   Eyes:  Negative for pain and visual disturbance.  Respiratory:  Negative for cough, chest tightness and shortness of breath.   Cardiovascular:  Negative for chest pain and palpitations.  Gastrointestinal:  Positive for nausea. Negative for abdominal pain and vomiting.  Genitourinary:  Negative for dysuria and hematuria.  Musculoskeletal:  Negative for arthralgias and back pain.  Skin:  Negative for color change and rash.  Neurological:  Positive for headaches. Negative for seizures and syncope.  All other systems reviewed and are negative.   Physical Exam Triage Vital Signs ED Triage Vitals  Enc Vitals Group     BP 01/09/21 1346 (!) 135/94     Pulse Rate 01/09/21 1346 81     Resp 01/09/21 1346 20     Temp 01/09/21 1346 99.4 F (37.4 C)     Temp Source 01/09/21 1346 Oral     SpO2 01/09/21 1346 97 %     Weight --      Height --      Head Circumference --      Peak Flow --      Pain Score 01/09/21 1344 9     Pain Loc --      Pain Edu?  --      Excl. in [place]? --    No data found.  Updated Vital Signs BP (!) 135/94 (BP Location: Right Arm)   Pulse 81   Temp 99.4 F (37.4 C) (Oral)   Resp 20   SpO2 97%   Visual Acuity Right Eye Distance:   Left Eye Distance:   Bilateral Distance:    Right Eye Near:   Left Eye Near:    Bilateral Near:     Physical Exam Vitals and nursing note reviewed.  Constitutional:      General: He is not in acute distress.    Appearance: He is well-developed.  HENT:     Head: Normocephalic and atraumatic.  Eyes:     Conjunctiva/sclera: Conjunctivae normal.  Cardiovascular:     Rate and Rhythm: Normal rate and regular rhythm.     Heart sounds: No murmur heard. Pulmonary:     Effort: Pulmonary effort is normal. No respiratory distress.     Breath sounds: Normal breath sounds.  Abdominal:     Palpations: Abdomen is soft.     Tenderness: There is no abdominal tenderness.  Musculoskeletal:        General: No swelling.     Cervical back: Neck supple.  Skin:    General: Skin is warm and dry.     Capillary Refill: Capillary refill takes less than 2 seconds.  Neurological:     Mental Status: He is alert.  Psychiatric:        Mood and Affect: Mood normal.     UC Treatments / Results  Labs (all labs ordered are listed, but only abnormal results are displayed) Labs Reviewed - No data to display  EKG   Radiology No results found.  Procedures Procedures (including critical care time)  Medications Ordered in UC Medications  ketorolac (TORADOL) 30 MG/ML injection 30 mg (30 mg Intramuscular Given 01/09/21 1417)  ondansetron (ZOFRAN-ODT) disintegrating tablet 4 mg (4 mg Oral Given 01/09/21 1417)    Initial Impression / Assessment and Plan / UC Course  I have reviewed the triage vital signs and the nursing notes.  Pertinent labs & imaging results that were available during my care of the patient were reviewed by me and considered in my medical decision making (see chart for  details).     Headache and nausea improved in clinic with toradol and Zofran.  HTN medications refilled.  PCP information given.  Strict return precautions  discussed.  Pt stable for discharge.  Final Clinical Impressions(s) / UC Diagnoses   Final diagnoses:  Acute nonintractable headache, unspecified headache type  Primary hypertension     Discharge Instructions      Take medication as prescribed Follow up with primary care physician     ED Prescriptions     Medication Sig Dispense Auth. Provider   lisinopril (ZESTRIL) 20 MG tablet Take 2 tablets (40 mg total) by mouth daily. 30 tablet Ward, Lenise Arena, PA-C      PDMP not reviewed this encounter.   Ward, Lenise Arena, PA-C 01/09/21 1431

## 2021-01-09 NOTE — ED Triage Notes (Signed)
Patient states he has been having headaches for weeks and he thinks its his blood pressure.   Has not had a prescription for blood pressure in a few years.  He states he did not think he needed any medicine until now.

## 2021-01-09 NOTE — Discharge Instructions (Addendum)
Take medication as prescribed Follow up with primary care physician 

## 2021-02-06 ENCOUNTER — Encounter (HOSPITAL_COMMUNITY): Payer: Self-pay | Admitting: *Deleted

## 2021-02-06 ENCOUNTER — Emergency Department (HOSPITAL_COMMUNITY)
Admission: EM | Admit: 2021-02-06 | Discharge: 2021-02-06 | Disposition: A | Discharge status: Home / Self Care | Payer: Self-pay | Attending: Emergency Medicine | Admitting: Emergency Medicine

## 2021-02-06 DIAGNOSIS — Z79899 Other long term (current) drug therapy: Secondary | ICD-10-CM | POA: Insufficient documentation

## 2021-02-06 DIAGNOSIS — U071 COVID-19: Secondary | ICD-10-CM | POA: Insufficient documentation

## 2021-02-06 DIAGNOSIS — F17210 Nicotine dependence, cigarettes, uncomplicated: Secondary | ICD-10-CM | POA: Insufficient documentation

## 2021-02-06 DIAGNOSIS — I10 Essential (primary) hypertension: Secondary | ICD-10-CM | POA: Insufficient documentation

## 2021-02-06 LAB — RESP PANEL BY RT-PCR (FLU A&B, COVID) ARPGX2
Influenza A by PCR: NEGATIVE
Influenza B by PCR: NEGATIVE
SARS Coronavirus 2 by RT PCR: POSITIVE — AB

## 2021-02-06 MED ORDER — ONDANSETRON 4 MG PO TBDP
4.0000 mg | ORAL_TABLET | Freq: Once | ORAL | Status: AC
Start: 2021-02-06 — End: 2021-02-06
  Administered 2021-02-06: 4 mg via ORAL
  Filled 2021-02-06: qty 1

## 2021-02-06 MED ORDER — ACETAMINOPHEN 325 MG PO TABS
650.0000 mg | ORAL_TABLET | Freq: Once | ORAL | Status: AC
  Administered 2021-02-06: 650 mg via ORAL
  Filled 2021-02-06: qty 2

## 2021-02-06 NOTE — Discharge Instructions (Signed)
You have COVID-19.  Recommend over-the-counter pain medications like ibuprofen Tylenol for fever and pain control, nasal decongestions like Flonase and Zyrtec, Mucinex for cough.  If not eating recommend supplementing with Gatorade to help with electrolyte supplementation.  You must self quarantine for 5 days starting on symptom onset, on day 6 you may go back to work but must wear a mask for additional 5 more days.  Follow-up PCP for further evaluation.  Come back to the emergency department if you develop chest pain, shortness of breath, severe abdominal pain, uncontrolled nausea, vomiting, diarrhea.

## 2021-02-06 NOTE — ED Triage Notes (Signed)
Pt c/o fever for past 2 days, + cough productive and emesis x1  today. Denies taking tylenol or motrin.

## 2021-02-06 NOTE — ED Provider Notes (Signed)
[facility] EMERGENCY DEPARTMENT Provider Note   CSN: [id number] Arrival date & time: 02/06/21  1322     History Chief Complaint  Patient presents with   Fever    Paul Lambert is a 45 y.o. male.  HPI  Patient without significant medical history presents with complaints of URI-like symptoms.  Patient symptoms started yesterday, he endorses fevers, chills, nasal ingestion, scratchy throat, nasal congestion slight cough, nausea, vomiting, general body aches.  States he vomited once, denies hematemesis or coffee-ground emesis, denies  constipation or diarrhea, denies  stomach pain, no chest pain or shortness of breath, please states he still tolerating p.o.  He is not vaccine against COVID or influenza, denies  recent sick contacts, is not immunocompromise, has not taken anything for his symptoms.  He denies  alleviating aggravating factors.  Past Medical History:  Diagnosis Date   ETOH abuse    Hypertension    Seizures (HCC)     Patient Active Problem List   Diagnosis Date Noted   AKI (acute kidney injury) (HCC) 11/20/2017   Hypertension 11/20/2017   Polycythemia 11/20/2017   Alcohol intoxication with delirium (HCC) 11/20/2017   Rhabdomyolysis 11/20/2017   Seizures (HCC) 11/20/2017   Hypokalemia 11/20/2017   Alcohol dependence (HCC) 11/20/2017   Cocaine use 11/20/2017   Alcoholic ketoacidosis 11/20/2017    Past Surgical History:  Procedure Laterality Date   FACIAL COSMETIC SURGERY         Family History  Problem Relation Age of Onset   Hypertension Mother    Hypertension Father     Social History   Tobacco Use   Smoking status: Every Day    Packs/day: 0.50    Types: Cigarettes   Smokeless tobacco: Never  Vaping Use   Vaping Use: Never used  Substance Use Topics   Alcohol use: Yes    Alcohol/week: 40.0 - 52.0 standard drinks    Types: 24 - 36 Cans of beer, 16 Shots of liquor per week    Comment: last drink yesterday   Drug use: Not Currently    Home  Medications Prior to Admission medications   Medication Sig Start Date End Date Taking? Authorizing Provider  chlordiazePOXIDE (LIBRIUM) 25 MG capsule 50mg  PO TID x 1D, then 25-50mg  PO BID X 1D, then 25-50mg  PO QD X 1D Patient not taking: Reported on 04/24/2020 04/14/20   06/14/20, MD  famotidine (PEPCID) 20 MG tablet Take 1 tablet (20 mg total) by mouth 2 (two) times daily. 04/27/19   04/29/19, MD  ibuprofen (ADVIL) 800 MG tablet Take 1 tablet (800 mg total) by mouth 3 (three) times daily. 02/14/20   04/13/20, PA-C  lisinopril (ZESTRIL) 20 MG tablet Take 2 tablets (40 mg total) by mouth daily. 01/09/21   Ward, 14/3/22, PA-C  omeprazole (PRILOSEC) 20 MG capsule Take 1 capsule (20 mg total) by mouth daily. 08/30/20   Rancour, 09/01/20, MD  ondansetron (ZOFRAN ODT) 4 MG disintegrating tablet 4mg  ODT q4 hours prn nausea/vomit Patient taking differently: Take 4 mg by mouth every 4 (four) hours as needed for nausea or vomiting. 04/27/19   , MD  polyethylene glycol (MIRALAX) 17 g packet Take 17 g by mouth daily. 07/26/20   Bethann Berkshire, MD    Allergies    Onion  Review of Systems   Review of Systems  Constitutional:  Positive for chills and fever.  HENT:  Positive for congestion and sore throat.   Respiratory:  Positive for cough. Negative  for shortness of breath.   Cardiovascular:  Negative for chest pain.  Gastrointestinal:  Positive for nausea and vomiting. Negative for abdominal pain.  Genitourinary:  Negative for enuresis.  Musculoskeletal:  Positive for myalgias.  Skin:  Negative for rash.  Neurological:  Negative for dizziness and headaches.  Hematological:  Does not bruise/bleed easily.   Physical Exam Updated Vital Signs BP 123/75 (BP Location: Right Arm)    Pulse 86    Temp 100.2 F (37.9 C) (Oral)    Resp 20    SpO2 98%   Physical Exam  ED Results / Procedures / Treatments   Labs (all labs ordered are listed, but only abnormal results are  displayed) Labs Reviewed  RESP PANEL BY RT-PCR (FLU A&B, COVID) ARPGX2 - Abnormal; Notable for the following components:      Result Value   SARS Coronavirus 2 by RT PCR POSITIVE (*)    All other components within normal limits    EKG None  Radiology No results found.  Procedures Procedures   Medications Ordered in ED Medications  ondansetron (ZOFRAN-ODT) disintegrating tablet 4 mg (4 mg Oral Given 02/06/21 1433)  acetaminophen (TYLENOL) tablet 650 mg (650 mg Oral Given 02/06/21 1432)    ED Course  I have reviewed the triage vital signs and the nursing notes.  Pertinent labs & imaging results that were available during my care of the patient were reviewed by me and considered in my medical decision making (see chart for details).    MDM Rules/Calculators/A&P                         Initial impression-presents with URI-like symptoms.  Alert, no acute stress, vital signs are reassuring.  Suspect viral URI, will obtain respiratory panel, provide with Tylenol and antiemetics and reassess.  Work-up-respiratory panel positive for influenza  Rule out- Low suspicion for systemic infection as patient is nontoxic-appearing, vital signs reassuring, no obvious source infection noted on exam.  Low suspicion for pneumonia as lung sounds are clear bilaterally, will defer imaging at this time. I have low suspicion for PE as patient denies pleuritic chest pain, shortness of breath, *PERC negative.  Low suspicion for strep throat as oropharynx was visualized, no erythema or exudates noted.  Low suspicion patient would need  hospitalized due to viral infection or Covid as vital signs reassuring, patient is not in respiratory distress.    Plan-  COVID-likely cause of his symptoms, patient has very mild symptoms, he is not immunocompromise, no risk factor for adverse outcome will defer antiviral treatment this time.  Vital signs have remained stable, no indication for hospital admission.    Patient given at home care as well strict return precautions.  Patient verbalized that they understood agreed to said plan.     Final Clinical Impression(s) / ED Diagnoses Final diagnoses:  COVID    Rx / DC Orders ED Discharge Orders     None        Marcello Fennel, PA-C 02/06/21 1631    Noemi Chapel, MD 02/11/21 [phone]

## 2021-02-17 ENCOUNTER — Encounter (HOSPITAL_COMMUNITY): Payer: Self-pay | Admitting: Emergency Medicine

## 2021-02-17 ENCOUNTER — Emergency Department (HOSPITAL_COMMUNITY)
Admission: EM | Admit: 2021-02-17 | Discharge: 2021-02-17 | Discharge status: Against Medical Advice | Payer: Self-pay | Source: Home / Self Care

## 2021-02-17 ENCOUNTER — Emergency Department (HOSPITAL_COMMUNITY)
Admission: EM | Admit: 2021-02-17 | Discharge: 2021-02-17 | Disposition: A | Discharge status: Home / Self Care | Payer: Self-pay | Attending: Emergency Medicine | Admitting: Emergency Medicine

## 2021-02-17 ENCOUNTER — Other Ambulatory Visit: Payer: Self-pay

## 2021-02-17 DIAGNOSIS — Z28310 Unvaccinated for covid-19: Secondary | ICD-10-CM | POA: Insufficient documentation

## 2021-02-17 DIAGNOSIS — U071 COVID-19: Secondary | ICD-10-CM | POA: Insufficient documentation

## 2021-02-17 DIAGNOSIS — R197 Diarrhea, unspecified: Secondary | ICD-10-CM

## 2021-02-17 DIAGNOSIS — R112 Nausea with vomiting, unspecified: Secondary | ICD-10-CM

## 2021-02-17 LAB — CBC WITH DIFFERENTIAL/PLATELET
Abs Immature Granulocytes: 0.01 10*3/uL (ref 0.00–0.07)
Basophils Absolute: 0 10*3/uL (ref 0.0–0.1)
Basophils Relative: 1 %
Eosinophils Absolute: 0.1 10*3/uL (ref 0.0–0.5)
Eosinophils Relative: 2 %
HCT: 50.2 % (ref 39.0–52.0)
Hemoglobin: 17.2 g/dL — ABNORMAL HIGH (ref 13.0–17.0)
Immature Granulocytes: 0 %
Lymphocytes Relative: 51 %
Lymphs Abs: 2.7 10*3/uL (ref 0.7–4.0)
MCH: 31.4 pg (ref 26.0–34.0)
MCHC: 34.3 g/dL (ref 30.0–36.0)
MCV: 91.8 fL (ref 80.0–100.0)
Monocytes Absolute: 0.5 10*3/uL (ref 0.1–1.0)
Monocytes Relative: 9 %
Neutro Abs: 2 10*3/uL (ref 1.7–7.7)
Neutrophils Relative %: 37 %
Platelets: 320 10*3/uL (ref 150–400)
RBC: 5.47 MIL/uL (ref 4.22–5.81)
RDW: 13.3 % (ref 11.5–15.5)
WBC: 5.4 10*3/uL (ref 4.0–10.5)
nRBC: 0 % (ref 0.0–0.2)

## 2021-02-17 LAB — BASIC METABOLIC PANEL
Anion gap: 12 (ref 5–15)
BUN: 12 mg/dL (ref 6–20)
CO2: 21 mmol/L — ABNORMAL LOW (ref 22–32)
Calcium: 8.1 mg/dL — ABNORMAL LOW (ref 8.9–10.3)
Chloride: 104 mmol/L (ref 98–111)
Creatinine, Ser: 1.03 mg/dL (ref 0.61–1.24)
GFR, Estimated: >60 mL/min (ref >60)
Glucose, Bld: 94 mg/dL (ref 70–99)
Potassium: 4.1 mmol/L (ref 3.5–5.1)
Sodium: 137 mmol/L (ref 135–145)

## 2021-02-17 LAB — LIPASE, BLOOD: Lipase: 31 U/L (ref 11–51)

## 2021-02-17 MED ORDER — ONDANSETRON HCL 4 MG/2ML IJ SOLN: 4.0000 mg | Freq: Once | INTRAMUSCULAR | Status: DC

## 2021-02-17 MED ORDER — ONDANSETRON 4 MG PO TBDP: 4.0000 mg | ORAL_TABLET | Freq: Three times a day (TID) | ORAL | 0 refills | Status: DC | PRN

## 2021-02-17 MED ORDER — ONDANSETRON HCL 4 MG PO TABS
4.0000 mg | ORAL_TABLET | Freq: Once | ORAL | Status: AC
  Administered 2021-02-17: 4 mg via ORAL
  Filled 2021-02-17: qty 1

## 2021-02-17 NOTE — ED Notes (Signed)
Rec'd d/c papers. All questions answered. Asked if he could take an ambulance home. Informed he is alert oriented and ambulatory and does not meet ambulance criteria. Verbalized understanding. Ambulatory to lobby.

## 2021-02-17 NOTE — ED Provider Notes (Signed)
°  Provider Note MRN:  [id number]  Arrival date & time: 02/17/21    ED Course and Medical Decision Making  Assumed care from Dr. Deretha Emory at shift change.  Lingering COVID symptoms, well-appearing, normal vital signs, electrolytes are reassuring, appropriate for discharge.  Procedures  Final Clinical Impressions(s) / ED Diagnoses     ICD-10-CM   1. Nausea vomiting and diarrhea  R11.2    R19.7       ED Discharge Orders          Ordered    ondansetron (ZOFRAN-ODT) 4 MG disintegrating tablet  Every 8 hours PRN        02/17/21 2306              Discharge Instructions      You were evaluated in the Emergency Department and after careful evaluation, we did not find any emergent condition requiring admission or further testing in the hospital.  Your exam/testing today is overall reassuring.  Symptoms likely due to lingering COVID symptoms.  Use Zofran medication prescribed as needed for nausea.  Can use Imodium over-the-counter as needed for diarrhea.  Drink plenty fluids and rest at home.  Please return to the Emergency Department if you experience any worsening of your condition.   Thank you for allowing [place] to be a part of your care.    Elmer Sow. Pilar Plate, MD [facility] Emergency Medicine [facility] [email or web address]    Sabas Sous, MD 02/17/21 [phone]

## 2021-02-17 NOTE — ED Provider Notes (Signed)
[facility] EMERGENCY DEPARTMENT Provider Note   CSN: [id number] Arrival date & time: 02/17/21  2104     History  Chief Complaint  Patient presents with   Vomiting    Paul Lambert is a 46 y.o. male.  HPI  Patient without medical history presents with complaints of URI-like symptoms.  Diagnosed with COVID on 12/31 states he is having continued symptoms, nasal congestion, sore throat, nonproductive cough, chest tightness, nausea, vomiting, diarrhea general body aches.  Patient states he still tolerating p.o., he denies hematemesis or coffee-ground emesis, denies melena or hematochezia, denies  shortness of breath, pleuritic chest pain, states he not been taking anything for his symptoms, he was on the antiviral treatment, not immunocompromise he is not vaccinated COVID or influenza.  Has no other complaints.  After reviewing patient's chart was seen on 12/31 diagnosed with COVID and discharged home.  Home Medications Prior to Admission medications   Medication Sig Start Date End Date Taking? Authorizing Provider  chlordiazePOXIDE (LIBRIUM) 25 MG capsule 50mg  PO TID x 1D, then 25-50mg  PO BID X 1D, then 25-50mg  PO QD X 1D Patient not taking: Reported on 04/24/2020 04/14/20   Daleen Bo, MD  famotidine (PEPCID) 20 MG tablet Take 1 tablet (20 mg total) by mouth 2 (two) times daily. 04/27/19   Milton Ferguson, MD  ibuprofen (ADVIL) 800 MG tablet Take 1 tablet (800 mg total) by mouth 3 (three) times daily. 02/14/20   Evalee Jefferson, PA-C  lisinopril (ZESTRIL) 20 MG tablet Take 2 tablets (40 mg total) by mouth daily. 01/09/21   Ward, Lenise Arena, PA-C  omeprazole (PRILOSEC) 20 MG capsule Take 1 capsule (20 mg total) by mouth daily. 08/30/20   Rancour, Annie Main, MD  ondansetron (ZOFRAN ODT) 4 MG disintegrating tablet 4mg  ODT q4 hours prn nausea/vomit Patient taking differently: Take 4 mg by mouth every 4 (four) hours as needed for nausea or vomiting. 04/27/19   Milton Ferguson, MD  polyethylene glycol  (MIRALAX) 17 g packet Take 17 g by mouth daily. 07/26/20   Ezequiel Essex, MD      Allergies    Onion    Review of Systems   Review of Systems  Constitutional:  Positive for chills and fever.  HENT:  Positive for congestion.   Respiratory:  Positive for chest tightness.   Cardiovascular:  Negative for chest pain.  Gastrointestinal:  Positive for diarrhea, nausea and vomiting. Negative for abdominal pain.  Musculoskeletal:  Positive for myalgias.   Physical Exam Updated Vital Signs BP (!) 131/99 (BP Location: Right Arm)    Pulse (!) 105    Temp 98.8 F (37.1 C) (Oral)    Resp 16    Ht 5\' 9"  (1.753 m)    Wt 117.9 kg    SpO2 97%    BMI 38.38 kg/m  Physical Exam Vitals and nursing note reviewed.  Constitutional:      General: He is not in acute distress.    Appearance: He is not ill-appearing.  HENT:     Head: Normocephalic and atraumatic.     Right Ear: Tympanic membrane, ear canal and external ear normal.     Left Ear: Tympanic membrane, ear canal and external ear normal.     Nose: No congestion.     Mouth/Throat:     Mouth: Mucous membranes are moist.     Pharynx: Oropharynx is clear. No oropharyngeal exudate or posterior oropharyngeal erythema.  Eyes:     Conjunctiva/sclera: Conjunctivae normal.  Cardiovascular:  Rate and Rhythm: Normal rate and regular rhythm.     Pulses: Normal pulses.     Heart sounds: No murmur heard.   No friction rub. No gallop.  Pulmonary:     Effort: No respiratory distress.     Breath sounds: No wheezing, rhonchi or rales.     Comments: No signs respiratory distress able speak in full sentences, nontachypneic, nonhypoxic, lung sounds are clear bilaterally. Abdominal:     Palpations: Abdomen is soft.     Tenderness: There is no abdominal tenderness. There is no right CVA tenderness or left CVA tenderness.     Comments: Abdomen nondistended, normal bowel sounds, dull to percussion, nontender to palpation, no guarding, rebound tenderness,  peritoneal sign negative Murphy sign or McBurney point.  Skin:    General: Skin is warm and dry.  Neurological:     Mental Status: He is alert.  Psychiatric:        Mood and Affect: Mood normal.    ED Results / Procedures / Treatments   Labs (all labs ordered are listed, but only abnormal results are displayed) Labs Reviewed  CBC WITH DIFFERENTIAL/PLATELET - Abnormal; Notable for the following components:      Result Value   Hemoglobin 17.2 (*)    All other components within normal limits  BASIC METABOLIC PANEL  LIPASE, BLOOD    EKG None  Radiology No results found.  Procedures Procedures    Medications Ordered in ED Medications  ondansetron (ZOFRAN) tablet 4 mg (4 mg Oral Given 02/17/21 2218)    ED Course/ Medical Decision Making/ A&P                           Medical Decision Making  This patient presents to the ED for concern of URI-like symptoms, this involves an extensive number of treatment options, and is a complaint that carries with it a high risk of complications and morbidity.  The differential diagnosis includes pneumonia, pancreatitis, electrolyte derailments    Additional history obtained:  Additional history obtained from electronic medical record External records from outside source obtained and reviewed including previous ED notes   Co morbidities that complicate the patient evaluation  N/A  Social Determinants of Health:  N/A    Lab Tests:  I Ordered, and personally interpreted labs.  The pertinent results include: CBC unremarkable,    Reevaluation:  Patient was provided Imodium as well as Zofran, reassessed tolerating p.o. has no complaints this time, agreed for discharge.   Rule out low suspicion for lower lobe pneumonia as lung sounds are clear bilaterally, will defer imaging at this time.  Low suspicion for C. difficile and she has no risk factors no recent antibiotic use, no recent hospitalizations, never had C. difficile  in the past.  Low suspicion for intra-abdominal infection as he has no surgical abdomen, vital signs reassuring.  Low suspicion for PE denies any pleuritic chest pain, shortness of breath, vital signs reassuring presentation more consistent with COVID infection.  Dispostion and problem list  Due to shift change patient will be handed off to Dr. Pilar Plate provide HPI, current work-up, likely disposition  Likely patient suffering from viral illness and can be discharged home follow-up on lab work when she is tolerating p.o. and can be discharged home antidiarrheal and antiemetics follow with PCP as needed.            Final Clinical Impression(s) / ED Diagnoses Final diagnoses:  None  Rx / DC Orders ED Discharge Orders     None         Aron Baba 02/17/21 2303    Fredia Sorrow, MD 02/25/21 1036

## 2021-02-17 NOTE — Discharge Instructions (Signed)
You were evaluated in the Emergency Department and after careful evaluation, we did not find any emergent condition requiring admission or further testing in the hospital.  Your exam/testing today is overall reassuring.  Symptoms likely due to lingering COVID symptoms.  Use Zofran medication prescribed as needed for nausea.  Can use Imodium over-the-counter as needed for diarrhea.  Drink plenty fluids and rest at home.  Please return to the Emergency Department if you experience any worsening of your condition.   Thank you for allowing [place] to be a part of your care.

## 2021-02-17 NOTE — ED Triage Notes (Signed)
Pt c/o n/v/d, he was diagnosed with covid last week.

## 2021-04-06 DIAGNOSIS — Z5321 Procedure and treatment not carried out due to patient leaving prior to being seen by health care provider: Secondary | ICD-10-CM | POA: Insufficient documentation

## 2021-04-06 DIAGNOSIS — M25512 Pain in left shoulder: Secondary | ICD-10-CM | POA: Insufficient documentation

## 2021-04-07 ENCOUNTER — Emergency Department (HOSPITAL_COMMUNITY): Payer: Self-pay

## 2021-04-07 ENCOUNTER — Other Ambulatory Visit: Payer: Self-pay

## 2021-04-07 ENCOUNTER — Emergency Department (HOSPITAL_COMMUNITY)
Admission: EM | Admit: 2021-04-07 | Discharge: 2021-04-07 | Discharge status: Against Medical Advice | Payer: Self-pay | Attending: Emergency Medicine | Admitting: Emergency Medicine

## 2021-04-07 NOTE — ED Triage Notes (Signed)
Ambulatory to ED with c/o L shoulder pain. Hx of surgery in same shoulder.  ?

## 2021-04-18 ENCOUNTER — Emergency Department (HOSPITAL_COMMUNITY)
Admission: EM | Admit: 2021-04-18 | Discharge: 2021-04-19 | Disposition: A | Discharge status: Home / Self Care | Payer: Self-pay | Attending: Emergency Medicine | Admitting: Emergency Medicine

## 2021-04-18 ENCOUNTER — Other Ambulatory Visit: Payer: Self-pay

## 2021-04-18 ENCOUNTER — Encounter (HOSPITAL_COMMUNITY): Payer: Self-pay | Admitting: Emergency Medicine

## 2021-04-18 DIAGNOSIS — R361 Hematospermia: Secondary | ICD-10-CM

## 2021-04-18 NOTE — ED Triage Notes (Signed)
Pt to the ED with hematuria for the past few days. ? ?

## 2021-04-19 LAB — URINALYSIS, ROUTINE W REFLEX MICROSCOPIC
Bacteria, UA: NONE SEEN
Bilirubin Urine: NEGATIVE
Glucose, UA: NEGATIVE mg/dL
Ketones, ur: NEGATIVE mg/dL
Leukocytes,Ua: NEGATIVE
Nitrite: NEGATIVE
Protein, ur: NEGATIVE mg/dL
Specific Gravity, Urine: 1.001 — ABNORMAL LOW (ref 1.005–1.030)
pH: 6 (ref 5.0–8.0)

## 2021-04-19 NOTE — ED Provider Notes (Signed)
? ?[facility]  ?Provider Note ? ?CSN: [id number] ?Arrival date & time: 04/18/21 2159 ? ?History ?Chief Complaint  ?Patient presents with  ? Hematuria  ? ? ?Paul Lambert is a 46 y.o. male reports he was having sex with a woman earlier today and she told him his semen looked orange or red. He is not having any other symptoms. No fever, pain with urination, penile discharge. He admits to EtOH use tonight.  ? ? ?Home Medications ?Prior to Admission medications   ?Medication Sig Start Date End Date Taking? Authorizing Provider  ?chlordiazePOXIDE (LIBRIUM) 25 MG capsule 50mg  PO TID x 1D, then 25-50mg  PO BID X 1D, then 25-50mg  PO QD X 1D ?Patient not taking: Reported on 04/24/2020 04/14/20   06/14/20, MD  ?famotidine (PEPCID) 20 MG tablet Take 1 tablet (20 mg total) by mouth 2 (two) times daily. 04/27/19   04/29/19, MD  ?ibuprofen (ADVIL) 800 MG tablet Take 1 tablet (800 mg total) by mouth 3 (three) times daily. 02/14/20   04/13/20, PA-C  ?lisinopril (ZESTRIL) 20 MG tablet Take 2 tablets (40 mg total) by mouth daily. 01/09/21   Ward, 14/3/22, PA-C  ?omeprazole (PRILOSEC) 20 MG capsule Take 1 capsule (20 mg total) by mouth daily. 08/30/20   Rancour, 09/01/20, MD  ?ondansetron (ZOFRAN-ODT) 4 MG disintegrating tablet Take 1 tablet (4 mg total) by mouth every 8 (eight) hours as needed for nausea or vomiting. 02/17/21   04/17/21, MD  ?polyethylene glycol (MIRALAX) 17 g packet Take 17 g by mouth daily. 07/26/20   07/28/20, MD  ? ? ? ?Allergies    ?Onion ? ? ?Review of Systems   ?Review of Systems ?Please see HPI for pertinent positives and negatives ? ?Physical Exam ?BP (!) 156/101 (BP Location: Right Arm)   Pulse (!) 111   Temp 97.7 ?F (36.5 ?C) (Oral)   Resp 18   Ht 5\' 9"  (1.753 m)   Wt 118 kg   SpO2 96%   BMI 38.42 kg/m?  ? ?Physical Exam ?Vitals and nursing note reviewed.  ?HENT:  ?   Head: Normocephalic.  ?   Nose: Nose normal.  ?Eyes:  ?   Extraocular Movements: Extraocular  movements intact.  ?Pulmonary:  ?   Effort: Pulmonary effort is normal.  ?Musculoskeletal:     ?   General: Normal range of motion.  ?   Cervical back: Neck supple.  ?Skin: ?   Findings: No rash (on exposed skin).  ?Neurological:  ?   Mental Status: He is alert and oriented to person, place, and time.  ?Psychiatric:     ?   Mood and Affect: Mood normal.  ? ? ?ED Results / Procedures / Treatments   ?EKG ?None ? ?Procedures ?Procedures ? ?Medications Ordered in the ED ?Medications - No data to display ? ?Initial Impression and Plan ? Patient here for a single reported episode of possible hematospermia. He is not having any other concerning symptoms but would like to be checked for STI. GC/CT added to his UA which was clear. Otherwiise he is stable for discharge.  ? ?ED Course  ? ?  ? ? ?MDM Rules/Calculators/A&P ?Medical Decision Making ?Problems Addressed: ?Hematospermia: acute illness or injury ? ?Amount and/or Complexity of Data Reviewed ?Labs: ordered. Decision-making details documented in ED Course. ? ? ? ?Final Clinical Impression(s) / ED Diagnoses ?Final diagnoses:  ?Hematospermia  ? ? ?Rx / DC Orders ?ED Discharge Orders   ? ?  None  ? ?  ? ?  ?Pollyann Savoy, MD ?04/19/21 [phone] ? ?

## 2021-04-20 LAB — GC/CHLAMYDIA PROBE AMP (~~LOC~~) NOT AT ARMC
Chlamydia: NEGATIVE
Comment: NEGATIVE
Comment: NORMAL
Neisseria Gonorrhea: NEGATIVE

## 2021-04-21 ENCOUNTER — Other Ambulatory Visit: Payer: Self-pay

## 2021-04-21 DIAGNOSIS — J36 Peritonsillar abscess: Secondary | ICD-10-CM | POA: Insufficient documentation

## 2021-04-21 DIAGNOSIS — D72829 Elevated white blood cell count, unspecified: Secondary | ICD-10-CM | POA: Insufficient documentation

## 2021-04-21 DIAGNOSIS — I10 Essential (primary) hypertension: Secondary | ICD-10-CM | POA: Insufficient documentation

## 2021-04-21 DIAGNOSIS — Z79899 Other long term (current) drug therapy: Secondary | ICD-10-CM | POA: Insufficient documentation

## 2021-04-22 ENCOUNTER — Encounter (HOSPITAL_COMMUNITY): Payer: Self-pay | Admitting: Emergency Medicine

## 2021-04-22 ENCOUNTER — Emergency Department (HOSPITAL_COMMUNITY)
Admission: EM | Admit: 2021-04-22 | Discharge: 2021-04-22 | Disposition: A | Discharge status: Home / Self Care | Payer: Self-pay | Attending: Emergency Medicine | Admitting: Emergency Medicine

## 2021-04-22 LAB — CBC WITH DIFFERENTIAL/PLATELET
Abs Immature Granulocytes: 0.04 10*3/uL (ref 0.00–0.07)
Basophils Absolute: 0.1 10*3/uL (ref 0.0–0.1)
Basophils Relative: 1 %
Eosinophils Absolute: 0.1 10*3/uL (ref 0.0–0.5)
Eosinophils Relative: 0 %
HCT: 46.4 % (ref 39.0–52.0)
Hemoglobin: 16 g/dL (ref 13.0–17.0)
Immature Granulocytes: 0 %
Lymphocytes Relative: 20 %
Lymphs Abs: 2.7 10*3/uL (ref 0.7–4.0)
MCH: 31.5 pg (ref 26.0–34.0)
MCHC: 34.5 g/dL (ref 30.0–36.0)
MCV: 91.3 fL (ref 80.0–100.0)
Monocytes Absolute: 0.9 10*3/uL (ref 0.1–1.0)
Monocytes Relative: 7 %
Neutro Abs: 9.6 10*3/uL — ABNORMAL HIGH (ref 1.7–7.7)
Neutrophils Relative %: 72 %
Platelets: 293 10*3/uL (ref 150–400)
RBC: 5.08 MIL/uL (ref 4.22–5.81)
RDW: 12.7 % (ref 11.5–15.5)
WBC: 13.3 10*3/uL — ABNORMAL HIGH (ref 4.0–10.5)
nRBC: 0 % (ref 0.0–0.2)

## 2021-04-22 LAB — BASIC METABOLIC PANEL
Anion gap: 12 (ref 5–15)
BUN: 6 mg/dL (ref 6–20)
CO2: 23 mmol/L (ref 22–32)
Calcium: 8.3 mg/dL — ABNORMAL LOW (ref 8.9–10.3)
Chloride: 99 mmol/L (ref 98–111)
Creatinine, Ser: 0.85 mg/dL (ref 0.61–1.24)
GFR, Estimated: >60 mL/min (ref >60)
Glucose, Bld: 105 mg/dL — ABNORMAL HIGH (ref 70–99)
Potassium: 3.4 mmol/L — ABNORMAL LOW (ref 3.5–5.1)
Sodium: 134 mmol/L — ABNORMAL LOW (ref 135–145)

## 2021-04-22 LAB — GROUP A STREP BY PCR: Group A Strep by PCR: DETECTED — AB

## 2021-04-22 MED ORDER — ACETAMINOPHEN 500 MG PO TABS
1000.0000 mg | ORAL_TABLET | Freq: Once | ORAL | Status: AC
  Administered 2021-04-22: 1000 mg via ORAL
  Filled 2021-04-22: qty 2

## 2021-04-22 MED ORDER — CLINDAMYCIN PHOSPHATE 600 MG/50ML IV SOLN
600.0000 mg | INTRAVENOUS | Status: AC
  Administered 2021-04-22 (×2): 600 mg via INTRAVENOUS
  Filled 2021-04-22 (×2): qty 50

## 2021-04-22 MED ORDER — CLINDAMYCIN HCL 300 MG PO CAPS: 300.0000 mg | ORAL_CAPSULE | Freq: Four times a day (QID) | ORAL | 0 refills | Status: DC

## 2021-04-22 MED ORDER — DEXTROSE 5 % IV SOLN: 1200.0000 mg | Freq: Once | INTRAVENOUS | Status: DC

## 2021-04-22 MED ORDER — PREDNISONE 10 MG PO TABS: 20.0000 mg | ORAL_TABLET | Freq: Two times a day (BID) | ORAL | 0 refills | Status: DC

## 2021-04-22 MED ORDER — DEXAMETHASONE SODIUM PHOSPHATE 10 MG/ML IJ SOLN
20.0000 mg | Freq: Once | INTRAMUSCULAR | Status: AC
  Administered 2021-04-22: 20 mg via INTRAVENOUS
  Filled 2021-04-22: qty 2

## 2021-04-22 MED ORDER — SODIUM CHLORIDE 0.9 % IV BOLUS
1000.0000 mL | Freq: Once | INTRAVENOUS | Status: AC
  Administered 2021-04-22: 1000 mL via INTRAVENOUS

## 2021-04-22 NOTE — ED Triage Notes (Signed)
Pt c/o sore throat for the past few days.  

## 2021-04-22 NOTE — Discharge Instructions (Signed)
Begin taking clindamycin and prednisone as prescribed. ? ?You are to follow-up with ear, nose, and throat in the next 2 to 3 days.  The contact information for [facility] has been provided in this discharge summary for you to call and make these arrangements. ?

## 2021-04-22 NOTE — ED Provider Notes (Signed)
?[facility] ?Provider Note ? ? ?CSN: [id number] ?Arrival date & time: 04/21/21  2318 ? ?  ? ?History ? ?Chief Complaint  ?Patient presents with  ? Sore Throat  ? ? ?Paul Lambert is a 46 y.o. male. ? ?Patient is a 46 year old male with past medical history of GERD and hypertension.  Patient presenting today for evaluation of sore throat.  This has been worsening over the past 2 days.  Pain is most significant to the right side of his throat.  He denies fevers or chills at home, but presents here with a temp of 100.2.  He denies ill contacts.  He describes pain with swallowing and difficulty eating and drinking. ? ?The history is provided by the patient.  ?Sore Throat ?This is a new problem. The current episode started 2 days ago. The problem occurs constantly. The problem has been rapidly worsening. The symptoms are aggravated by swallowing, eating and drinking. Nothing relieves the symptoms.  ? ?  ? ?Home Medications ?Prior to Admission medications   ?Medication Sig Start Date End Date Taking? Authorizing Provider  ?chlordiazePOXIDE (LIBRIUM) 25 MG capsule 50mg  PO TID x 1D, then 25-50mg  PO BID X 1D, then 25-50mg  PO QD X 1D ?Patient not taking: Reported on 04/24/2020 04/14/20   06/14/20, MD  ?famotidine (PEPCID) 20 MG tablet Take 1 tablet (20 mg total) by mouth 2 (two) times daily. 04/27/19   04/29/19, MD  ?ibuprofen (ADVIL) 800 MG tablet Take 1 tablet (800 mg total) by mouth 3 (three) times daily. 02/14/20   04/13/20, PA-C  ?lisinopril (ZESTRIL) 20 MG tablet Take 2 tablets (40 mg total) by mouth daily. 01/09/21   Ward, 14/3/22, PA-C  ?omeprazole (PRILOSEC) 20 MG capsule Take 1 capsule (20 mg total) by mouth daily. 08/30/20   Rancour, 09/01/20, MD  ?ondansetron (ZOFRAN-ODT) 4 MG disintegrating tablet Take 1 tablet (4 mg total) by mouth every 8 (eight) hours as needed for nausea or vomiting. 02/17/21   04/17/21, MD  ?polyethylene glycol (MIRALAX) 17 g packet Take 17 g by mouth daily.  07/26/20   07/28/20, MD  ?   ? ?Allergies    ?Onion   ? ?Review of Systems   ?Review of Systems  ?All other systems reviewed and are negative. ? ?Physical Exam ?Updated Vital Signs ?BP (!) 163/110 (BP Location: Right Arm)   Pulse (!) 126   Temp 100.2 ?F (37.9 ?C) (Oral)   Resp 20   Ht 5\' 9"  (1.753 m)   Wt 118 kg   SpO2 99%   BMI 38.42 kg/m?  ?Physical Exam ?Vitals and nursing note reviewed.  ?Constitutional:   ?   General: He is not in acute distress. ?   Appearance: He is well-developed. He is not diaphoretic.  ?HENT:  ?   Head: Normocephalic and atraumatic.  ?   Mouth/Throat:  ?   Mouth: Mucous membranes are moist.  ?   Pharynx: Posterior oropharyngeal erythema present.  ?   Tonsils: Tonsillar exudate and tonsillar abscess present.  ?   Comments: There is erythema of the posterior oropharynx.  There is asymmetrical swelling of the right tonsil with some deviation of the tonsillar pillar. ?Cardiovascular:  ?   Rate and Rhythm: Normal rate and regular rhythm.  ?   Heart sounds: No murmur heard. ?  No friction rub.  ?Pulmonary:  ?   Effort: Pulmonary effort is normal. No respiratory distress.  ?   Breath sounds: Normal  breath sounds. No wheezing or rales.  ?Abdominal:  ?   General: Bowel sounds are normal. There is no distension.  ?   Palpations: Abdomen is soft.  ?   Tenderness: There is no abdominal tenderness.  ?Musculoskeletal:     ?   General: Normal range of motion.  ?   Cervical back: Normal range of motion and neck supple.  ?Skin: ?   General: Skin is warm and dry.  ?Neurological:  ?   Mental Status: He is alert and oriented to person, place, and time.  ?   Coordination: Coordination normal.  ? ? ?ED Results / Procedures / Treatments   ?Labs ?(all labs ordered are listed, but only abnormal results are displayed) ?Labs Reviewed  ?GROUP A STREP BY PCR - Abnormal; Notable for the following components:  ?    Result Value  ? Group A Strep by PCR DETECTED (*)   ? All other components within normal  limits  ? ? ?EKG ?None ? ?Radiology ?No results found. ? ?Procedures ?Procedures  ? ? ?Medications Ordered in ED ?Medications  ?sodium chloride 0.9 % bolus 1,000 mL (has no administration in time range)  ?clindamycin (CLEOCIN) 1,200 mg in dextrose 5 % 50 mL IVPB (has no administration in time range)  ?dexamethasone (DECADRON) injection 20 mg (has no administration in time range)  ?acetaminophen (TYLENOL) tablet 1,000 mg (1,000 mg Oral Given 04/22/21 0136)  ? ? ?ED Course/ Medical Decision Making/ A&P ? ?This patient presents to the ED for concern of sore throat, this involves an extensive number of treatment options, and is a complaint that carries with it a high risk of complications and morbidity.  The differential diagnosis includes strep pharyngitis, viral pharyngitis, peritonsillar abscess ? ? ?Co morbidities that complicate the patient evaluation ? ?None ? ? ?Additional history obtained: ? ?No additional history or external records needed ? ?Lab Tests: ? ?I Ordered, and personally interpreted labs.  The pertinent results include: Leukocytosis with white count of 13,000, but metabolic panel that is unremarkable, strep test is positive ? ? ?Imaging Studies ordered: ? ?None performed ? ? ?Cardiac Monitoring: ? ?None performed ? ? ?Medicines ordered and prescription drug management: ? ?I ordered medication including clindamycin for infection and Decadron for swelling/inflammation ?Reevaluation of the patient after these medicines showed that the patient improved ?I have reviewed the patients home medicines and have made adjustments as needed ? ? ?Test Considered: ? ?None ? ? ?Critical Interventions: ? ?IV antibiotics and Decadron ? ? ?Consultations Obtained: ? ?No emergent consultations obtained, but patient will require urgent follow-up with ENT.  Referral to be placed. ? ? ?Problem List / ED Course: ? ?Patient presenting with a 2-day history of sore throat, worse on the right.  He has findings on exam consistent  with a peritonsillar abscess, but no evidence for stridor or airway compromise.  He does describe significant discomfort with drinking, eating, and swallowing.  Patient given IV steroids and clindamycin and I will have the patient followed up by ENT in the very near future.  He will be discharged with prednisone and clindamycin. ? ? ? ?Social Determinants of Health: ? ?None ? ? ? ? ?Final Clinical Impression(s) / ED Diagnoses ?Final diagnoses:  ?None  ? ? ?Rx / DC Orders ?ED Discharge Orders   ? ? None  ? ?  ? ? ?  ?Geoffery Lyons, MD ?04/22/21 0543 ? ?

## 2021-06-14 ENCOUNTER — Emergency Department (HOSPITAL_COMMUNITY)
Admission: EM | Admit: 2021-06-14 | Discharge: 2021-06-15 | Disposition: A | Discharge status: Other Acute Inpatient Hospital | Payer: Self-pay | Attending: Emergency Medicine | Admitting: Emergency Medicine

## 2021-06-14 ENCOUNTER — Other Ambulatory Visit: Payer: Self-pay

## 2021-06-14 ENCOUNTER — Encounter (HOSPITAL_COMMUNITY): Payer: Self-pay

## 2021-06-14 DIAGNOSIS — F10129 Alcohol abuse with intoxication, unspecified: Secondary | ICD-10-CM | POA: Insufficient documentation

## 2021-06-14 DIAGNOSIS — F10920 Alcohol use, unspecified with intoxication, uncomplicated: Secondary | ICD-10-CM

## 2021-06-14 DIAGNOSIS — Z20822 Contact with and (suspected) exposure to covid-19: Secondary | ICD-10-CM | POA: Insufficient documentation

## 2021-06-14 DIAGNOSIS — Z79899 Other long term (current) drug therapy: Secondary | ICD-10-CM | POA: Insufficient documentation

## 2021-06-14 DIAGNOSIS — F1410 Cocaine abuse, uncomplicated: Secondary | ICD-10-CM | POA: Insufficient documentation

## 2021-06-14 DIAGNOSIS — R45851 Suicidal ideations: Secondary | ICD-10-CM | POA: Insufficient documentation

## 2021-06-14 DIAGNOSIS — Y906 Blood alcohol level of 120-199 mg/100 ml: Secondary | ICD-10-CM | POA: Insufficient documentation

## 2021-06-14 LAB — CBC
HCT: 44.3 % (ref 39.0–52.0)
Hemoglobin: 15 g/dL (ref 13.0–17.0)
MCH: 31.1 pg (ref 26.0–34.0)
MCHC: 33.9 g/dL (ref 30.0–36.0)
MCV: 91.7 fL (ref 80.0–100.0)
Platelets: 204 10*3/uL (ref 150–400)
RBC: 4.83 MIL/uL (ref 4.22–5.81)
RDW: 13.2 % (ref 11.5–15.5)
WBC: 8.5 10*3/uL (ref 4.0–10.5)
nRBC: 0 % (ref 0.0–0.2)

## 2021-06-14 LAB — COMPREHENSIVE METABOLIC PANEL
ALT: 25 U/L (ref 0–44)
AST: 37 U/L (ref 15–41)
Albumin: 4.6 g/dL (ref 3.5–5.0)
Alkaline Phosphatase: 53 U/L (ref 38–126)
Anion gap: 11 (ref 5–15)
BUN: 12 mg/dL (ref 6–20)
CO2: 19 mmol/L — ABNORMAL LOW (ref 22–32)
Calcium: 8.6 mg/dL — ABNORMAL LOW (ref 8.9–10.3)
Chloride: 110 mmol/L (ref 98–111)
Creatinine, Ser: 1.09 mg/dL (ref 0.61–1.24)
GFR, Estimated: >60 mL/min (ref >60)
Glucose, Bld: 85 mg/dL (ref 70–99)
Potassium: 4.1 mmol/L (ref 3.5–5.1)
Sodium: 140 mmol/L (ref 135–145)
Total Bilirubin: 1.5 mg/dL — ABNORMAL HIGH (ref 0.3–1.2)
Total Protein: 8.1 g/dL (ref 6.5–8.1)

## 2021-06-14 LAB — RAPID URINE DRUG SCREEN, HOSP PERFORMED
Amphetamines: NOT DETECTED
Barbiturates: NOT DETECTED
Benzodiazepines: NOT DETECTED
Cocaine: POSITIVE — AB
Opiates: NOT DETECTED
Tetrahydrocannabinol: NOT DETECTED

## 2021-06-14 LAB — ETHANOL: Alcohol, Ethyl (B): 185 mg/dL — ABNORMAL HIGH (ref <10)

## 2021-06-14 LAB — RESP PANEL BY RT-PCR (FLU A&B, COVID) ARPGX2
Influenza A by PCR: NEGATIVE
Influenza B by PCR: NEGATIVE
SARS Coronavirus 2 by RT PCR: NEGATIVE

## 2021-06-14 LAB — SALICYLATE LEVEL: Salicylate Lvl: <7 mg/dL — ABNORMAL LOW (ref 7.0–30.0)

## 2021-06-14 LAB — ACETAMINOPHEN LEVEL: Acetaminophen (Tylenol), Serum: <10 ug/mL — ABNORMAL LOW (ref 10–30)

## 2021-06-14 MED ORDER — FAMOTIDINE 20 MG PO TABS
20.0000 mg | ORAL_TABLET | Freq: Two times a day (BID) | ORAL | Status: DC
  Administered 2021-06-14 – 2021-06-15 (×3): 20 mg via ORAL
  Filled 2021-06-14 (×3): qty 1

## 2021-06-14 MED ORDER — LISINOPRIL 10 MG PO TABS
40.0000 mg | ORAL_TABLET | Freq: Every day | ORAL | Status: DC
  Administered 2021-06-14 – 2021-06-15 (×2): 40 mg via ORAL
  Filled 2021-06-14 (×2): qty 4

## 2021-06-14 MED ORDER — ALUM & MAG HYDROXIDE-SIMETH 200-200-20 MG/5ML PO SUSP: 30.0000 mL | Freq: Four times a day (QID) | ORAL | Status: DC | PRN

## 2021-06-14 MED ORDER — PANTOPRAZOLE SODIUM 40 MG PO TBEC
40.0000 mg | DELAYED_RELEASE_TABLET | Freq: Every day | ORAL | Status: DC
  Administered 2021-06-14 – 2021-06-15 (×2): 40 mg via ORAL
  Filled 2021-06-14 (×2): qty 1

## 2021-06-14 MED ORDER — ACETAMINOPHEN 325 MG PO TABS: 650.0000 mg | ORAL_TABLET | ORAL | Status: DC | PRN

## 2021-06-14 MED ORDER — NICOTINE 14 MG/24HR TD PT24
14.0000 mg | MEDICATED_PATCH | Freq: Every day | TRANSDERMAL | Status: DC
  Administered 2021-06-14 – 2021-06-15 (×2): 14 mg via TRANSDERMAL
  Filled 2021-06-14 (×2): qty 1

## 2021-06-14 MED ORDER — ONDANSETRON HCL 4 MG PO TABS: 4.0000 mg | ORAL_TABLET | Freq: Three times a day (TID) | ORAL | Status: DC | PRN

## 2021-06-14 NOTE — BH Assessment (Signed)
Comprehensive Clinical Assessment (CCA) Note ? ?06/14/2021 ?Paul Lambert ?DC:[id number] ? ?DISPOSITION: Gave clinical report to Quintella Reichert, NP who determined Pt meets criteria for inpatient psychiatric treatment. At this time, not all lab work has resulted. Notified Dr. Delora Fuel and Maci Brame, RN of recommendation via secure message. ? ?The patient demonstrates the following risk factors for suicide: Chronic risk factors for suicide include: psychiatric disorder of major depressive disorder, substance use disorder, and previous suicide attempts by cutting his wrist with broken glass . Acute risk factors for suicide include: family or marital conflict, unemployment, social withdrawal/isolation, and loss (financial, interpersonal, professional). Protective factors for this patient include: positive social support. Considering these factors, the overall suicide risk at this point appears to be high. Patient is not appropriate for outpatient follow up. ? ?[facility] ED from 06/14/2021 in [facility] ED from 04/22/2021 in [facility] ED from 04/18/2021 in [place]  ?C-SSRS RISK CATEGORY High Risk No Risk No Risk  ? ?  ? ?Pt is a 46 year old male who presents unaccompanied to [facility] ED reporting depressive symptoms, auditory hallucinations, and suicidal ideation. Pt says his girlfriend told him tonight that he needs to seek mental health treatment. He says he has experienced suicidal ideation for the past 2-3 days with plan to cut his wrist or shoot himself in the head. He say he could gain access to a gun. He reports one previous suicide attempt by cutting his wrist and hand with broken glass, which he says required surgery. He describes his mood as depressed and acknowledges symptoms including crying spells, social withdrawal, loss of interest in usual pleasures, fatigue, decreased concentration, decreased sleep, and feelings of guilt, worthlessness and  hopelessness. He reports thoughts of harming "people who have hurt me" with no identified victim, plan, or intent. He denies history of violence. He reports auditory hallucinations of people talking behind him or someone calling his name. He denies visual hallucinations.  ? ?Pt says he drank a small amount of alcohol earlier today but denies abuse of alcohol, stating "I don't drink like that." He says he smokes marijuana "once in a blue moon." He denies use of cocaine, opiates, methamphetamines, or other substances. Pt's medical record indicates a history of alcohol abuse. Pt's urine drug screen is positive for cocaine and blood alcohol level is in process.  ? ?Pt identifies financial stress as his primary stressor. He is current unemployed. He has a history of homelessness and says he is currently going back and forth between his mother's residence and his cousin's residence. He says he has a court date tomorrow for trespassing, which he says happened a year ago. He denies history of abuse. He denies current outpatient mental health providers. Pt's medical record indicates he has been referred to substance abuse treatment in the past. ? ?Pt is dressed in hospital scrubs, alert and oriented x4. Pt speaks in a slightly mumbled tone, at moderate volume and normal pace. Motor behavior appears normal. Eye contact is good. Pt's mood is depressed and affect is congruent with mood. Thought process is coherent and relevant. There is no indication from Pt's behavior that he is currently responding to internal stimuli or experiencing delusional thought content. He is cooperative and says he needs mental health treatment. ? ?Chief Complaint:  ?Chief Complaint  ?Patient presents with  ? Suicidal  ? ?Visit Diagnosis:  ?F33.3 Major depressive disorder, Recurrent episode, With psychotic features ? ? ?CCA Screening, Triage and  Referral (STR) ? ?Patient Reported Information ?How did you hear about [place]? Family/Friend ? ?What Is the  Reason for Your Visit/Call Today? Pt says his girlfriend encouraged him to seek mental health treatment. He says he has felt depressed and stressed for the past 2-3 days. He reports current suicidal ideation with plan to cut his wrist or shoot himself. He endorses auditory hallucinations of hearing someone behind him or hearing someone call his name. He has a history of alcohol, marijuana, and cocaine use. ? ?How Long Has This Been Causing You Problems? <Week ? ?What Do You Feel Would Help You the Most Today? Treatment for Depression or other mood problem ? ? ?Have You Recently Had Any Thoughts About Hurting Yourself? Yes ? ?Are You Planning to Commit Suicide/Harm Yourself At This time? Yes ? ? ?Have you Recently Had Thoughts About [place]? Yes ? ?Are You Planning to Harm Someone at This Time? No ? ?Explanation: No data recorded ? ?Have You Used Any Alcohol or Drugs in the Past 24 Hours? Yes ? ?How Long Ago Did You Use Drugs or Alcohol? No data recorded ?What Did You Use and How Much? Pt reports drinking a small amount of alcohol ? ? ?Do You Currently Have a Therapist/Psychiatrist? No ? ?Name of Therapist/Psychiatrist: No data recorded ? ?Have You Been Recently Discharged From Any Office Practice or Programs? No ? ?Explanation of Discharge From Practice/Program: No data recorded ? ?  ?CCA Screening Triage Referral Assessment ?Type of Contact: Tele-Assessment ? ?Telemedicine Service Delivery: Telemedicine service delivery: This service was provided via telemedicine using a 2-way, interactive audio and video technology ? ?Is this Initial or Reassessment? Initial Assessment ? ?Date Telepsych consult ordered in [facility]:  06/14/21 ? ?Time Telepsych consult ordered in [facility]:  0156 ? ?Location of Assessment: [facility] ? ?Provider Location: [facility] Assessment Services ? ? ?Collateral Involvement: Medical record ? ? ?Does Patient Have a Stage manager Guardian? No data recorded ?Name and Contact of Legal Guardian:  No data recorded ?If Minor and Not Living with Parent(s), Who has Custody? NA ? ?Is CPS involved or ever been involved? Never ? ?Is APS involved or ever been involved? Never ? ? ?Patient Determined To Be At Risk for Harm To Self or Others Based on Review of Patient Reported Information or Presenting Complaint? Yes, for Self-Harm ? ?Method: No data recorded ?Availability of Means: No data recorded ?Intent: No data recorded ?Notification Required: No data recorded ?Additional Information for Danger to Others Potential: No data recorded ?Additional Comments for Danger to Others Potential: No data recorded ?Are There Guns or Other Weapons in [place]? No data recorded ?Types of Guns/Weapons: No data recorded ?Are These Weapons Safely Secured?                            No data recorded ?Who Could Verify You Are Able To Have These Secured: No data recorded ?Do You Have any Outstanding Charges, Pending Court Dates, Parole/Probation? No data recorded ?Contacted To Inform of Risk of Harm To Self or Others: Unable to Contact: ? ? ? ?Does Patient Present under Involuntary Commitment? No ? ?IVC Papers Initial File Date: No data recorded ? ?[place] of Residence: [place] ? ? ?Patient Currently Receiving the Following Services: Not Receiving Services ? ? ?Determination of Need: Urgent (48 hours) ? ? ?Options For Referral: Inpatient Hospitalization; [facility]; Facility-Based Crisis ? ? ? ? ?CCA Biopsychosocial ?Patient Reported Schizophrenia/Schizoaffective  Diagnosis in Past: No ? ? ?Strengths: Pt is requesting treatment. ? ? ?Mental Health Symptoms ?Depression:   ?Change in energy/activity; Fatigue; Hopelessness; Worthlessness; Sleep (too much or little); Difficulty Concentrating; Increase/decrease in appetite; Tearfulness ?  ?Duration of Depressive symptoms:  ?Duration of Depressive Symptoms: Less than two weeks ?  ?Mania:   ?None ?  ?Anxiety:    ?Tension; Worrying; Sleep ?  ?Psychosis:   ?Hallucinations ?  ?Duration of  Psychotic symptoms:  ?Duration of Psychotic Symptoms: Less than six months ?  ?Trauma:   ?None ?  ?Obsessions:   ?None ?  ?Compulsions:   ?None ?  ?Inattention:   ?None ?  ?Hyperactivity/Impulsivity:

## 2021-06-14 NOTE — ED Provider Notes (Signed)
?[facility] EMERGENCY DEPARTMENT ?Provider Note ? ? ?CSN: [id number] ?Arrival date & time: 06/14/21  0049 ? ?  ? ?History ? ?Chief Complaint  ?Patient presents with  ? Suicidal  ? ? ?Paul Lambert is a 46 y.o. male. ? ?The history is provided by the patient.  ?He has history of hypertension, seizures, alcohol abuse, cocaine abuse and comes in because he is suicidal.  He admits to being depressed, but states he just started getting suicidal tonight.  He says he has thought of a lot of ways to kill himself including himself on a head and cutting himself.  He states that people only want to be around him when he has some money or other things to give them.  He does admit to crying spells, early morning awakening, anhedonia.  He he states he sometimes sees spots and sometimes thinks he sees somebody in his peripheral vision who is not there and sometimes hears a voice behind him but there is not actually anyone there.  He denies any command hallucinations.  He does admit to drinking alcohol earlier today, admits to using marijuana but denies other drug use. ?  ?Home Medications ?Prior to Admission medications   ?Medication Sig Start Date End Date Taking? Authorizing Provider  ?chlordiazePOXIDE (LIBRIUM) 25 MG capsule 50mg  PO TID x 1D, then 25-50mg  PO BID X 1D, then 25-50mg  PO QD X 1D ?Patient not taking: Reported on 04/24/2020 04/14/20   06/14/20, MD  ?clindamycin (CLEOCIN) 300 MG capsule Take 1 capsule (300 mg total) by mouth 4 (four) times daily. X 7 days 04/22/21   04/24/21, MD  ?famotidine (PEPCID) 20 MG tablet Take 1 tablet (20 mg total) by mouth 2 (two) times daily. 04/27/19   04/29/19, MD  ?ibuprofen (ADVIL) 800 MG tablet Take 1 tablet (800 mg total) by mouth 3 (three) times daily. 02/14/20   04/13/20, PA-C  ?lisinopril (ZESTRIL) 20 MG tablet Take 2 tablets (40 mg total) by mouth daily. 01/09/21   Ward, 14/3/22, PA-C  ?omeprazole (PRILOSEC) 20 MG capsule Take 1 capsule (20 mg total) by mouth daily.  08/30/20   Rancour, 09/01/20, MD  ?ondansetron (ZOFRAN-ODT) 4 MG disintegrating tablet Take 1 tablet (4 mg total) by mouth every 8 (eight) hours as needed for nausea or vomiting. 02/17/21   04/17/21, MD  ?polyethylene glycol (MIRALAX) 17 g packet Take 17 g by mouth daily. 07/26/20   Rancour, 07/28/20, MD  ?predniSONE (DELTASONE) 10 MG tablet Take 2 tablets (20 mg total) by mouth 2 (two) times daily. 04/22/21   04/24/21, MD  ?   ? ?Allergies    ?Onion   ? ?Review of Systems   ?Review of Systems  ?All other systems reviewed and are negative. ? ?Physical Exam ?Updated Vital Signs ?BP (!) 153/104 (BP Location: Right Arm)   Pulse (!) 119   Temp 98.2 ?F (36.8 ?C)   Resp 18   Ht 5\' 9"  (1.753 m)   Wt 79.4 kg   SpO2 96%   BMI 25.84 kg/m?  ?Physical Exam ?Vitals and nursing note reviewed.  ?46 year old male, resting comfortably and in no acute distress. Vital signs are significant for elevated blood pressure and heart rate. Oxygen saturation is 96%, which is normal. ?Head is normocephalic and atraumatic. PERRLA, EOMI. Oropharynx is clear. ?Neck is nontender and supple without adenopathy or JVD. ?Back is nontender and there is no CVA tenderness. ?Lungs are clear without rales, wheezes, or rhonchi. ?Chest  is nontender. ?Heart has regular rate and rhythm without murmur. ?Abdomen is soft, flat, nontender. ?Extremities have no cyanosis or edema, full range of motion is present. ?Skin is warm and dry without rash. ?Neurologic: Mental status is normal, cranial nerves are intact, moves all extremities equally. ? ?ED Results / Procedures / Treatments   ?Labs ?(all labs ordered are listed, but only abnormal results are displayed) ?Labs Reviewed  ?COMPREHENSIVE METABOLIC PANEL  ?ETHANOL  ?SALICYLATE LEVEL  ?ACETAMINOPHEN LEVEL  ?CBC  ?RAPID URINE DRUG SCREEN, HOSP PERFORMED  ? ? ?EKG ?None ? ?Radiology ?No results found. ? ?Procedures ?Procedures  ? ? ?Medications Ordered in ED ?Medications  ?nicotine (NICODERM CQ - dosed  in mg/24 hours) patch 14 mg (has no administration in time range)  ?alum & mag hydroxide-simeth (MAALOX/MYLANTA) 200-200-20 MG/5ML suspension 30 mL (has no administration in time range)  ?ondansetron (ZOFRAN) tablet 4 mg (has no administration in time range)  ?acetaminophen (TYLENOL) tablet 650 mg (has no administration in time range)  ? ? ?ED Course/ Medical Decision Making/ A&P ?  ?                        ?Medical Decision Making ?Amount and/or Complexity of Data Reviewed ?Labs: ordered. ? ?Risk ?OTC drugs. ?Prescription drug management. ? ? ?Depression with suicidal ideation.  Old records are reviewed, and he has multiple ED visits for alcohol abuse and suicidal ideation, but I do not see any psychiatric hospitalizations.  Screening labs are obtained and TTS consultation will be requested. ? ?Ethanol level is 185, drug screen positive for cocaine.  Remainder of labs are unremarkable.  TTS consultation is appreciated, patient will require inpatient treatment.  Currently awaiting appropriate placement by TTS. ? ?Final Clinical Impression(s) / ED Diagnoses ?Final diagnoses:  ?Suicidal ideation  ?Alcohol intoxication, uncomplicated (HCC)  ?Cocaine abuse (HCC)  ? ? ?Rx / DC Orders ?ED Discharge Orders   ? ? None  ? ?  ? ? ?  ?[name], MD ?06/14/21 [phone] ? ?

## 2021-06-14 NOTE — ED Triage Notes (Signed)
Pt states he is going through a lot and tonight he started feeling suicidal. Denies having an active plan ?

## 2021-06-14 NOTE — ED Notes (Signed)
Pt speaking with girlfriend at this time ?

## 2021-06-14 NOTE — Consult Note (Signed)
Telepsych Consultation  ? ?Reason for Consult:  Psych Consult ?Referring Physician:  Dr. Preston Fleeting ?Location of Patient: [facility] ?Location of Provider: Behavioral Health TTS Department ? ?Patient Identification: Paul Lambert ?MRN:  [id number] ?Principal Diagnosis: <principal problem not specified> ?Diagnosis:  Active Problems: ?  * No active hospital problems. * ? ? ?Total Time spent with patient: 1 hour ? ?Subjective:   ?Paul Lambert is a 46 y.o. male patient. ? ?HPI: As per [facility] chart review Pt is a 46 year old male who presents unaccompanied to [facility] ED reporting depressive symptoms, auditory hallucinations, and suicidal ideation. Pt says his girlfriend told him tonight that he needs to seek mental health treatment. He says he has experienced suicidal ideation for the past 2-3 days with plan to cut his wrist or shoot himself in the head. He say he could gain access to a gun. He reports one previous suicide attempt by cutting his wrist and hand with broken glass, which he says required surgery. He describes his mood as depressed and acknowledges symptoms including crying spells, social withdrawal, loss of interest in usual pleasures, fatigue, decreased concentration, decreased sleep, and feelings of guilt, worthlessness and hopelessness. He reports thoughts of harming "people who have hurt me" with no identified victim, plan, or intent. He denies history of violence. He reports auditory hallucinations of people talking behind him or someone calling his name. He denies visual hallucinations.  ?   ?Pt identifies financial stress as his primary stressor. He is current unemployed. He has a history of homelessness and says he is currently going back and forth between his mother's residence and his cousin's residence. He says he has a court date tomorrow for trespassing, which he says happened a year ago. He denies history of abuse. He denies current outpatient mental health providers. Pt's medical record indicates he has  been referred to substance abuse treatment in the past. ? ?On assessment via Telepsych, patient is sitting in a chair in his room, appeared angry and agitated. Chart is reviewed and findings shared with tx team and discussed with Dr. Lucianne Muss. Alert and oriented to person, place, time and situation. Mood anxious, depressed, and angry. Affect congruent. Thought process coherent and linear. Memory, judgement and insight fair. ? ?Reported his girl friend sent him to [facility] to get evaluated for his mental health, added he has plans to hurt him self and others that are treating him wrong. He endorsed above HPI. Endorsed poor sleep hours and reduced appetite in the context of homelessness. He continues to hear people calling his name, however denied visual hallucinations.  Denied fire arms in the house and self injurious behavior. Denied drug use, alcohol use and occasionally smoking 2 sticks of cigarette per day. However, chart review,  indicated a history of alcohol abuse. Pt's urine drug screen is positive for cocaine and blood alcohol level is in process. Denied past hx of mental health illness, family hx of mental illness or being followed by a therapist or a psychiatrist.  ? ?Disposition: Bases on my evaluation of patient, recommend psychiatric inpatient admission when medically cleared. Supportive therapy provided about ongoing stressors. Discussed crisis plan, support from social network, calling 911, coming to the Emergency Department, and calling Suicide Hotline. [facility] staff made aware of patient disposition. At this time [facility] does not have a bed for patient as per [facility] staff. ? ? ?Past Psychiatric History: Alcohol abuse and dependence, Alcohol ketoacidosis, Alcohol intoxication with delirium, Cocaine dependence ? ?Risk to Self:  yes ?Risk  to Others:  yes ?Prior Inpatient Therapy:   ?Prior Outpatient Therapy:   ? ?Past Medical History:  ?Past Medical History:  ?Diagnosis Date  ? ETOH abuse   ? Hypertension   ? Seizures  (HCC)   ?  ?Past Surgical History:  ?Procedure Laterality Date  ? FACIAL COSMETIC SURGERY    ? ?Family History:  ?Family History  ?Problem Relation Age of Onset  ? Hypertension Mother   ? Hypertension Father   ? ?Family Psychiatric  History: None indicated ?Social History:  ?Social History  ? ?Substance and Sexual Activity  ?Alcohol Use Yes  ? Alcohol/week: 40.0 - 52.0 standard drinks  ? Types: 24 - 36 Cans of beer, 16 Shots of liquor per week  ? Comment: last drink yesterday  ?   ?Social History  ? ?Substance and Sexual Activity  ?Drug Use Not Currently  ? Frequency: 7.0 times per week  ? Types: Marijuana  ?  ?Social History  ? ?Socioeconomic History  ? Marital status: Single  ?  Spouse name: Not on file  ? Number of children: Not on file  ? Years of education: Not on file  ? Highest education level: Not on file  ?Occupational History  ? Not on file  ?Tobacco Use  ? Smoking status: Every Day  ?  Packs/day: 0.50  ?  Types: Cigarettes  ? Smokeless tobacco: Never  ?Vaping Use  ? Vaping Use: Never used  ?Substance and Sexual Activity  ? Alcohol use: Yes  ?  Alcohol/week: 40.0 - 52.0 standard drinks  ?  Types: 24 - 36 Cans of beer, 16 Shots of liquor per week  ?  Comment: last drink yesterday  ? Drug use: Not Currently  ?  Frequency: 7.0 times per week  ?  Types: Marijuana  ? Sexual activity: Not on file  ?Other Topics Concern  ? Not on file  ?Social History Narrative  ? Not on file  ? ?Social Determinants of Health  ? ?Financial Resource Strain: Not on file  ?Food Insecurity: Not on file  ?Transportation Needs: Not on file  ?Physical Activity: Not on file  ?Stress: Not on file  ?Social Connections: Not on file  ? ?Additional Social History: ?  ? ?Allergies:   ?Allergies  ?Allergen Reactions  ? Onion Anaphylaxis  ?  OTHER: lettuce.  ? ? ?Labs:  ?Results for orders placed or performed during the hospital encounter of 06/14/21 (from the past 48 hour(s))  ?Rapid urine drug screen (hospital performed)     Status: Abnormal   ? Collection Time: 06/14/21  1:28 AM  ?Result Value Ref Range  ? Opiates NONE DETECTED NONE DETECTED  ? Cocaine POSITIVE (A) NONE DETECTED  ? Benzodiazepines NONE DETECTED NONE DETECTED  ? Amphetamines NONE DETECTED NONE DETECTED  ? Tetrahydrocannabinol NONE DETECTED NONE DETECTED  ? Barbiturates NONE DETECTED NONE DETECTED  ?  Comment: (NOTE) ?DRUG SCREEN FOR MEDICAL PURPOSES ?ONLY.  IF CONFIRMATION IS NEEDED ?FOR ANY PURPOSE, NOTIFY LAB ?WITHIN 5 DAYS. ? ?LOWEST DETECTABLE LIMITS ?FOR URINE DRUG SCREEN ?Drug Class                     Cutoff (ng/mL) ?Amphetamine and metabolites    1000 ?Barbiturate and metabolites    200 ?Benzodiazepine                 200 ?Tricyclics and metabolites     300 ?Opiates and metabolites        300 ?Cocaine and  metabolites        300 ?THC                            50 ?Performed at [facility], [address] [place], [place] [address] ?  ?Comprehensive metabolic panel     Status: Abnormal  ? Collection Time: 06/14/21  2:22 AM  ?Result Value Ref Range  ? Sodium 140 135 - 145 mmol/L  ? Potassium 4.1 3.5 - 5.1 mmol/L  ? Chloride 110 98 - 111 mmol/L  ? CO2 19 (L) 22 - 32 mmol/L  ? Glucose, Bld 85 70 - 99 mg/dL  ?  Comment: Glucose reference range applies only to samples taken after fasting for at least 8 hours.  ? BUN 12 6 - 20 mg/dL  ? Creatinine, Ser 1.09 0.61 - 1.24 mg/dL  ? Calcium 8.6 (L) 8.9 - 10.3 mg/dL  ? Total Protein 8.1 6.5 - 8.1 g/dL  ? Albumin 4.6 3.5 - 5.0 g/dL  ? AST 37 15 - 41 U/L  ? ALT 25 0 - 44 U/L  ? Alkaline Phosphatase 53 38 - 126 U/L  ? Total Bilirubin 1.5 (H) 0.3 - 1.2 mg/dL  ? GFR, Estimated >60 >60 mL/min  ?  Comment: (NOTE) ?Calculated using the CKD-EPI Creatinine Equation (2021) ?  ? Anion gap 11 5 - 15  ?  Comment: Performed at [facility], [address] [place], [place] [address]  ?Ethanol     Status: Abnormal  ? Collection Time: 06/14/21  2:22 AM  ?Result Value Ref Range  ? Alcohol, Ethyl (B) 185 (H) <10 mg/dL  ?  Comment: (NOTE) ?Lowest detectable limit  for serum alcohol is 10 mg/dL. ? ?For medical purposes only. ?Performed at [facility], [address] [place], [place] [address] ?  ?Salicylate level     Status: Abnormal  ? Collection Time: 06/14/21

## 2021-06-15 ENCOUNTER — Encounter (HOSPITAL_COMMUNITY): Payer: Self-pay | Admitting: Behavioral Health

## 2021-06-15 ENCOUNTER — Other Ambulatory Visit: Payer: Self-pay

## 2021-06-15 ENCOUNTER — Inpatient Hospital Stay (HOSPITAL_COMMUNITY)
Admission: RE | Admit: 2021-06-15 | Discharge: 2021-06-21 | DRG: 885 | Disposition: A | Discharge status: Home / Self Care | Payer: Federal, State, Local not specified - Other | Source: Intra-hospital | Attending: Psychiatry | Admitting: Psychiatry

## 2021-06-15 ENCOUNTER — Inpatient Hospital Stay (HOSPITAL_COMMUNITY): Admission: AD | Admit: 2021-06-15 | Payer: No Payment, Other | Source: Intra-hospital | Admitting: Psychiatry

## 2021-06-15 DIAGNOSIS — F17200 Nicotine dependence, unspecified, uncomplicated: Secondary | ICD-10-CM

## 2021-06-15 DIAGNOSIS — Z560 Unemployment, unspecified: Secondary | ICD-10-CM | POA: Diagnosis not present

## 2021-06-15 DIAGNOSIS — R45851 Suicidal ideations: Secondary | ICD-10-CM | POA: Diagnosis present

## 2021-06-15 DIAGNOSIS — G4700 Insomnia, unspecified: Secondary | ICD-10-CM | POA: Diagnosis present

## 2021-06-15 DIAGNOSIS — J302 Other seasonal allergic rhinitis: Secondary | ICD-10-CM | POA: Diagnosis present

## 2021-06-15 DIAGNOSIS — Z888 Allergy status to other drugs, medicaments and biological substances status: Secondary | ICD-10-CM | POA: Diagnosis not present

## 2021-06-15 DIAGNOSIS — K219 Gastro-esophageal reflux disease without esophagitis: Secondary | ICD-10-CM | POA: Diagnosis present

## 2021-06-15 DIAGNOSIS — F410 Panic disorder [episodic paroxysmal anxiety] without agoraphobia: Secondary | ICD-10-CM | POA: Diagnosis present

## 2021-06-15 DIAGNOSIS — F17210 Nicotine dependence, cigarettes, uncomplicated: Secondary | ICD-10-CM | POA: Diagnosis present

## 2021-06-15 DIAGNOSIS — F1210 Cannabis abuse, uncomplicated: Secondary | ICD-10-CM | POA: Diagnosis present

## 2021-06-15 DIAGNOSIS — T783XXA Angioneurotic edema, initial encounter: Secondary | ICD-10-CM | POA: Diagnosis present

## 2021-06-15 DIAGNOSIS — Z5902 Unsheltered homelessness: Secondary | ICD-10-CM | POA: Diagnosis not present

## 2021-06-15 DIAGNOSIS — Z87892 Personal history of anaphylaxis: Secondary | ICD-10-CM

## 2021-06-15 DIAGNOSIS — Z79899 Other long term (current) drug therapy: Secondary | ICD-10-CM | POA: Diagnosis not present

## 2021-06-15 DIAGNOSIS — Z9151 Personal history of suicidal behavior: Secondary | ICD-10-CM | POA: Diagnosis not present

## 2021-06-15 DIAGNOSIS — T464X5A Adverse effect of angiotensin-converting-enzyme inhibitors, initial encounter: Secondary | ICD-10-CM | POA: Diagnosis not present

## 2021-06-15 DIAGNOSIS — F1020 Alcohol dependence, uncomplicated: Secondary | ICD-10-CM | POA: Diagnosis present

## 2021-06-15 DIAGNOSIS — Z91018 Allergy to other foods: Secondary | ICD-10-CM

## 2021-06-15 DIAGNOSIS — F1490 Cocaine use, unspecified, uncomplicated: Secondary | ICD-10-CM | POA: Diagnosis present

## 2021-06-15 DIAGNOSIS — F10229 Alcohol dependence with intoxication, unspecified: Secondary | ICD-10-CM | POA: Diagnosis not present

## 2021-06-15 DIAGNOSIS — I10 Essential (primary) hypertension: Secondary | ICD-10-CM | POA: Diagnosis present

## 2021-06-15 DIAGNOSIS — F333 Major depressive disorder, recurrent, severe with psychotic symptoms: Secondary | ICD-10-CM | POA: Diagnosis present

## 2021-06-15 DIAGNOSIS — F10239 Alcohol dependence with withdrawal, unspecified: Secondary | ICD-10-CM | POA: Diagnosis present

## 2021-06-15 DIAGNOSIS — Z8249 Family history of ischemic heart disease and other diseases of the circulatory system: Secondary | ICD-10-CM

## 2021-06-15 DIAGNOSIS — R569 Unspecified convulsions: Secondary | ICD-10-CM

## 2021-06-15 LAB — URINALYSIS, COMPLETE (UACMP) WITH MICROSCOPIC
Bacteria, UA: NONE SEEN
Bilirubin Urine: NEGATIVE
Glucose, UA: NEGATIVE mg/dL
Hgb urine dipstick: NEGATIVE
Ketones, ur: 5 mg/dL — AB
Leukocytes,Ua: NEGATIVE
Nitrite: NEGATIVE
Protein, ur: NEGATIVE mg/dL
Specific Gravity, Urine: 1.024 (ref 1.005–1.030)
pH: 5 (ref 5.0–8.0)

## 2021-06-15 MED ORDER — LISINOPRIL 40 MG PO TABS
40.0000 mg | ORAL_TABLET | Freq: Every day | ORAL | Status: DC
  Filled 2021-06-15: qty 1

## 2021-06-15 MED ORDER — ONDANSETRON HCL 4 MG PO TABS: 4.0000 mg | ORAL_TABLET | Freq: Three times a day (TID) | ORAL | Status: DC | PRN

## 2021-06-15 MED ORDER — PNEUMOCOCCAL 20-VAL CONJ VACC 0.5 ML IM SUSY
0.5000 mL | PREFILLED_SYRINGE | INTRAMUSCULAR | Status: DC
  Filled 2021-06-15 (×2): qty 0.5

## 2021-06-15 MED ORDER — FAMOTIDINE 20 MG PO TABS
20.0000 mg | ORAL_TABLET | Freq: Two times a day (BID) | ORAL | Status: DC
  Administered 2021-06-15 – 2021-06-21 (×12): 20 mg via ORAL
  Filled 2021-06-15 (×3): qty 1
  Filled 2021-06-15: qty 14
  Filled 2021-06-15 (×9): qty 1
  Filled 2021-06-15: qty 14
  Filled 2021-06-15 (×4): qty 1

## 2021-06-15 MED ORDER — LOPERAMIDE HCL 2 MG PO CAPS: 2.0000 mg | ORAL_CAPSULE | ORAL | Status: DC | PRN

## 2021-06-15 MED ORDER — LORAZEPAM 1 MG PO TABS: 1.0000 mg | ORAL_TABLET | Freq: Four times a day (QID) | ORAL | Status: AC | PRN

## 2021-06-15 MED ORDER — NAPROXEN 500 MG PO TABS: 500.0000 mg | ORAL_TABLET | Freq: Two times a day (BID) | ORAL | Status: AC | PRN

## 2021-06-15 MED ORDER — HYDROXYZINE HCL 25 MG PO TABS: 25.0000 mg | ORAL_TABLET | Freq: Three times a day (TID) | ORAL | Status: DC | PRN

## 2021-06-15 MED ORDER — ACETAMINOPHEN 325 MG PO TABS
650.0000 mg | ORAL_TABLET | Freq: Four times a day (QID) | ORAL | Status: DC | PRN
  Administered 2021-06-15 – 2021-06-20 (×2): 650 mg via ORAL
  Filled 2021-06-15 (×2): qty 2

## 2021-06-15 MED ORDER — ONDANSETRON 4 MG PO TBDP
4.0000 mg | ORAL_TABLET | Freq: Four times a day (QID) | ORAL | Status: DC | PRN
  Administered 2021-06-15: 4 mg via ORAL
  Filled 2021-06-15: qty 1

## 2021-06-15 MED ORDER — DICYCLOMINE HCL 20 MG PO TABS
20.0000 mg | ORAL_TABLET | Freq: Four times a day (QID) | ORAL | Status: DC | PRN
  Administered 2021-06-15: 20 mg via ORAL
  Filled 2021-06-15: qty 1

## 2021-06-15 MED ORDER — LOPERAMIDE HCL 2 MG PO CAPS: 2.0000 mg | ORAL_CAPSULE | ORAL | Status: AC | PRN

## 2021-06-15 MED ORDER — HYDROXYZINE HCL 25 MG PO TABS
25.0000 mg | ORAL_TABLET | Freq: Four times a day (QID) | ORAL | Status: DC | PRN
  Administered 2021-06-15: 25 mg via ORAL
  Filled 2021-06-15: qty 1

## 2021-06-15 MED ORDER — THIAMINE HCL 100 MG PO TABS
100.0000 mg | ORAL_TABLET | Freq: Every day | ORAL | Status: DC
  Administered 2021-06-16 – 2021-06-21 (×6): 100 mg via ORAL
  Filled 2021-06-15 (×2): qty 1
  Filled 2021-06-15: qty 7
  Filled 2021-06-15 (×6): qty 1

## 2021-06-15 MED ORDER — ADULT MULTIVITAMIN W/MINERALS CH
1.0000 | ORAL_TABLET | Freq: Every day | ORAL | Status: DC
  Administered 2021-06-15 – 2021-06-21 (×7): 1 via ORAL
  Filled 2021-06-15 (×9): qty 1

## 2021-06-15 MED ORDER — ALUM & MAG HYDROXIDE-SIMETH 200-200-20 MG/5ML PO SUSP: 30.0000 mL | ORAL | Status: DC | PRN

## 2021-06-15 MED ORDER — MAGNESIUM HYDROXIDE 400 MG/5ML PO SUSP
30.0000 mL | Freq: Every day | ORAL | Status: DC | PRN
Start: 2021-06-15 — End: 2021-06-21

## 2021-06-15 MED ORDER — PANTOPRAZOLE SODIUM 40 MG PO TBEC
40.0000 mg | DELAYED_RELEASE_TABLET | Freq: Every day | ORAL | Status: DC
  Administered 2021-06-16 – 2021-06-21 (×6): 40 mg via ORAL
  Filled 2021-06-15 (×2): qty 1
  Filled 2021-06-15: qty 7
  Filled 2021-06-15 (×6): qty 1

## 2021-06-15 MED ORDER — NICOTINE 14 MG/24HR TD PT24
14.0000 mg | MEDICATED_PATCH | Freq: Every day | TRANSDERMAL | Status: DC
Start: 2021-06-16 — End: 2021-06-21
  Administered 2021-06-16 – 2021-06-21 (×6): 14 mg via TRANSDERMAL
  Filled 2021-06-15 (×9): qty 1

## 2021-06-15 MED ORDER — METHOCARBAMOL 500 MG PO TABS: 500.0000 mg | ORAL_TABLET | Freq: Three times a day (TID) | ORAL | Status: AC | PRN

## 2021-06-15 NOTE — ED Notes (Signed)
Pt upset that he has been moved to a hall bed and is threatening to leave AMA.  EDP made aware. ?

## 2021-06-15 NOTE — ED Notes (Addendum)
All belongings, paperwork, and property given to [employer] by [employer].   ? ?Pt transported to [facility] 500-1. ?
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
## 2021-06-15 NOTE — ED Notes (Signed)
Pt is calm and cooperative.  Understanding we are actively working to get him to [facility]. ?

## 2021-06-15 NOTE — ED Provider Notes (Signed)
Emergency Medicine Observation Re-evaluation Note ? ?Paul Lambert is a 46 y.o. male, seen on rounds today.  Pt initially presented to the ED for complaints of Suicidal ?Currently, the patient is calm. ? ?Physical Exam  ?BP 132/87 (BP Location: Right Arm)   Pulse 98   Temp 98.6 ?F (37 ?C) (Oral)   Resp 16   Ht 5\' 9"  (1.753 m)   Wt 79.4 kg   SpO2 98%   BMI 25.84 kg/m?  ?Physical Exam ?General: No distress ?Cardiac: Regular rate and rhythm ?Lungs: No increased work of breathing ?Psych: Calm ? ?ED Course / MDM  ?EKG:  ? ?I have reviewed the labs performed to date as well as medications administered while in observation.  Recent changes in the last 24 hours include acceptance to behavioral health Hospital, awaiting transfer. ? ?Plan  ?Current plan is for as above plan for transfer to behavioral health Hospital. ? Paul Lambert is not under involuntary commitment. ? ? ?  ?Manuella Ghazi, MD ?06/15/21 0935 ? ?

## 2021-06-15 NOTE — Progress Notes (Signed)
Adult Psychoeducational Group Note ? ?Date:  06/15/2021 ?Time:  8:38 PM ? ?Group Topic/Focus:  ?Wrap-Up Group:   The focus of this group is to help patients review their daily goal of treatment and discuss progress on daily workbooks. ? ?Participation Level:  Did Not Attend ? ?Participation Quality:  Did Not Attend ? ?Affect:  Did Not Attend ? ?Cognitive:  Did Not Attend ? ?Insight: None ? ?Engagement in Group:  Did Not Attend ? ?Modes of Intervention:  Did Not Attend ? ?Additional Comments:   ?Today was the pts first day on the unit. He was encouraged to attend wrap up group but refused.  ? ?Vevelyn Pat ?06/15/2021, 8:38 PM ?

## 2021-06-15 NOTE — Tx Team (Signed)
Initial Treatment Plan ?06/15/2021 ?4:12 PM ?MAKHARI DOVIDIO ?NOB:[id number] ? ? ? ?PATIENT STRESSORS: ?Financial difficulties   ?Legal issue   ?Occupational concerns   ?Substance abuse   ? ? ?PATIENT STRENGTHS: ?Capable of independent living  ?Communication skills  ?Religious Affiliation  ?Supportive family/friends  ?Work skills  ? ? ?PATIENT IDENTIFIED PROBLEMS: ?Risk for suicide "I have thoughts to hurt myself because I can't stop drinking since I was 46 y/o".  ?  ?Psychosis "I hear voices calling my name. I see shadows and feel like people walking behind me when there's no one there".  ?  ?Financial constraints "I lost my [employer] job a year ago because my back hurt".  ?  ?homeless  ?  ?  ?  ? ?DISCHARGE CRITERIA:  ?Improved stabilization in mood, thinking, and/or behavior ?Verbal commitment to aftercare and medication compliance ? ?PRELIMINARY DISCHARGE PLAN: ?Outpatient therapy ?Placement in alternative living arrangements ? ?PATIENT/FAMILY INVOLVEMENT: ?This treatment plan has been presented to and reviewed with the patient, Manuella Ghazi. The patient have been given the opportunity to ask questions and make suggestions. ? ?Sherryl Manges, RN ?06/15/2021, 4:12 PM ?

## 2021-06-16 ENCOUNTER — Encounter (HOSPITAL_COMMUNITY): Payer: Self-pay | Admitting: Behavioral Health

## 2021-06-16 DIAGNOSIS — F10229 Alcohol dependence with intoxication, unspecified: Secondary | ICD-10-CM

## 2021-06-16 DIAGNOSIS — F1490 Cocaine use, unspecified, uncomplicated: Secondary | ICD-10-CM

## 2021-06-16 DIAGNOSIS — T783XXA Angioneurotic edema, initial encounter: Secondary | ICD-10-CM | POA: Diagnosis not present

## 2021-06-16 DIAGNOSIS — R569 Unspecified convulsions: Secondary | ICD-10-CM

## 2021-06-16 DIAGNOSIS — F17200 Nicotine dependence, unspecified, uncomplicated: Secondary | ICD-10-CM

## 2021-06-16 DIAGNOSIS — I10 Essential (primary) hypertension: Secondary | ICD-10-CM

## 2021-06-16 DIAGNOSIS — F10239 Alcohol dependence with withdrawal, unspecified: Secondary | ICD-10-CM

## 2021-06-16 DIAGNOSIS — F333 Major depressive disorder, recurrent, severe with psychotic symptoms: Secondary | ICD-10-CM | POA: Diagnosis not present

## 2021-06-16 LAB — LIPID PANEL
Cholesterol: 194 mg/dL (ref 0–200)
HDL: 72 mg/dL (ref >40)
LDL Cholesterol: 53 mg/dL (ref 0–99)
Total CHOL/HDL Ratio: 2.7 RATIO
Triglycerides: 344 mg/dL — ABNORMAL HIGH (ref <150)
VLDL: 69 mg/dL — ABNORMAL HIGH (ref 0–40)

## 2021-06-16 LAB — TSH: TSH: 1.584 u[IU]/mL (ref 0.350–4.500)

## 2021-06-16 LAB — HEMOGLOBIN A1C
Hgb A1c MFr Bld: 5.6 % (ref 4.8–5.6)
Mean Plasma Glucose: 114.02 mg/dL

## 2021-06-16 MED ORDER — DIPHENHYDRAMINE HCL 50 MG/ML IJ SOLN
50.0000 mg | Freq: Once | INTRAMUSCULAR | Status: AC
  Administered 2021-06-16: 50 mg via INTRAMUSCULAR
  Filled 2021-06-16 (×2): qty 1

## 2021-06-16 MED ORDER — SERTRALINE HCL 50 MG PO TABS
50.0000 mg | ORAL_TABLET | Freq: Every day | ORAL | Status: DC
Start: 2021-06-17 — End: 2021-06-17
  Administered 2021-06-17: 50 mg via ORAL
  Filled 2021-06-16 (×3): qty 1

## 2021-06-16 MED ORDER — LORATADINE 10 MG PO TBDP: 10.0000 mg | ORAL_TABLET | Freq: Every day | ORAL | 12 refills | Status: DC

## 2021-06-16 MED ORDER — DIPHENHYDRAMINE HCL 25 MG PO CAPS
25.0000 mg | ORAL_CAPSULE | Freq: Four times a day (QID) | ORAL | Status: AC | PRN
  Administered 2021-06-16 – 2021-06-17 (×4): 25 mg via ORAL
  Filled 2021-06-16 (×4): qty 1

## 2021-06-16 MED ORDER — DIPHENHYDRAMINE HCL 25 MG PO CAPS: 50.0000 mg | ORAL_CAPSULE | Freq: Three times a day (TID) | ORAL | Status: DC | PRN

## 2021-06-16 MED ORDER — LORATADINE 10 MG PO TABS
10.0000 mg | ORAL_TABLET | Freq: Every day | ORAL | Status: DC
  Administered 2021-06-16 – 2021-06-21 (×6): 10 mg via ORAL
  Filled 2021-06-16 (×4): qty 1
  Filled 2021-06-16: qty 7
  Filled 2021-06-16 (×4): qty 1

## 2021-06-16 MED ORDER — DIPHENHYDRAMINE HCL 25 MG PO CAPS: 25.0000 mg | ORAL_CAPSULE | Freq: Four times a day (QID) | ORAL | 0 refills | Status: DC | PRN

## 2021-06-16 MED ORDER — SERTRALINE HCL 25 MG PO TABS
25.0000 mg | ORAL_TABLET | Freq: Once | ORAL | Status: AC
  Administered 2021-06-16: 25 mg via ORAL
  Filled 2021-06-16 (×2): qty 1

## 2021-06-16 NOTE — BHH Group Notes (Signed)
Pt didn't attend group. 

## 2021-06-16 NOTE — Progress Notes (Signed)
Patient upper lip swelling appears to have increase Patient stated  " Its getting more tight but has no pain, no SOB. Provided notified and came to assess the Patient.  ?

## 2021-06-16 NOTE — Progress Notes (Signed)
Patient woke up with swelling to his upper lip. He is Allergic to onion only has never had any medications allergies. He does not recall eating foods with onion today.  Provider notified. Benadryly 50 mg IM injection given to right deltoid. Patient denies any difficult breathing. Support and encouragement provided.  ?

## 2021-06-16 NOTE — BHH Counselor (Signed)
CSW spoke with the [facility] Attorneys office and faxed a letter with patients hospitalization record in attempts to have this patients warrant withdrawn.  ? ? ? ?Ruthann Cancer MSW, LCSW ?Clincal Social Worker  ?[facility]  ?

## 2021-06-16 NOTE — Discharge Instructions (Addendum)
It was a pleasure taking care of you today.  I believe that the swelling in your lip was related to the blood pressure medication you received.  We are going to give you loratadine which is a long-acting antihistamine like Benadryl but I also want you to take Benadryl every 6 hours for the next 2 days unless the swelling in your lips has completely resolved and then you can discontinue early.  If your symptoms return or get worse please be reevaluated.  ?

## 2021-06-16 NOTE — Group Note (Signed)
BHH/BMU LCSW Group Therapy Note ? ?Date/Time:  06/16/2021 / 1:00pm ? ?Type of Therapy and Topic:  Group Therapy:  Self-Care Wheel ? ?Participation Level:  Did Not Attend  ? ?Description of Group ?This process group involved patients discussing the importance of self-care in different areas of life (professional, personal, emotional, psychological, spiritual, and physical) in order to achieve healthy life balance.  The group talked about what self-care in each of those areas would constitute and then specifically listed how they want to provide themselves with improved self-care. ? ?Therapeutic Goals ?Patient will learn how to break self-care down into various areas of life ?Patient will participate in generating ideas about healthy self-care options in each category ?Patients will be supportive of one another and receive support from others ?Patient will identify one healthy self-care activity to add to his/her life  ? ?Summary of Patient Progress:  Did not attend ? ?Therapeutic Modalities ?Processing ?Psychoeducation ? ? ? ?Fox Salminen MSW, LCSW ?Clincal Social Worker  ?[facility]  ?

## 2021-06-16 NOTE — Progress Notes (Signed)
Patient in bed swelling seems not to have improved much Patient denies SOB or Pain at present. Provider notified. Support and encouragement provided.  ?

## 2021-06-16 NOTE — Progress Notes (Signed)
Patient alert and oriented by 3. No difficult breathing. To be transported via EMS non emergency to [facility] ED for upper lip swelling Per Provider orders. Benadryl  50 mg IM given at 0215 not effective. The swelling is increasing. VS BP 117/94, P86 R 16, 02 98% T98.4 on room air. Staff has been watching Patient closely. ? ?Report called to ED Charge Nurse Victorino Dike.  ?

## 2021-06-16 NOTE — Progress Notes (Signed)
Recreation Therapy Notes ? ?INPATIENT RECREATION THERAPY ASSESSMENT ? ?Patient Details ?Name: Paul Lambert ?MRN: [id number] ?DOB: [birth year] ?Today's Date: 06/16/2021 ?      ?Information Obtained From: ?Patient ? ?Able to Participate in Assessment/Interview: ?Yes ? ?Patient Presentation: ?Alert ? ?Reason for Admission (Per Patient): ?Other (Comments) ("wanting to hurt people") ? ?Patient Stressors: ?Other (Comment) ("drinking and weed") ? ?Coping Skills:   ?TV, Music, Exercise, Deep Breathing, Meditate, Substance Abuse, Prayer, Avoidance, Hot Bath/Shower ? ?Leisure Interests (2+):  ?Nature [employer], Individual - Other (Comment) (Drinking) ? ?Frequency of Recreation/Participation: ?Other (Comment) (Drinking-Daily; Fishing- When he gets a chace) ? ?Awareness of Community Resources:  ?Yes ? ?Community Resources:  ?Park, Patent examiner, Other (Comment) [place]) ? ?Current Use: ?Yes ? ?If no, Barriers?: ?  ? ?Expressed Interest in [employer] Information: ?No ? ?[place] of Residence:  ?[place] ? ?Patient Main Form of Transportation: ?Other (Comment) ("other people") ? 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 ?Patient Identified Areas of Improvement:  ?Praying, Stop drinking and stop weed ? ?Patient Goal for Hospitalization:  ?"get help" ? ?Current SI (including self-harm):  ?No ? ?Current HI:  ?No ? ?Current AVH: ?Yes (Hears someone calling his name; Seeing black dots) ? ?Staff Intervention Plan: ?Group Attendance, Collaborate with Interdisciplinary Treatment Team ? ?Consent to Intern Participation: ?N/A ? ? ?Victorino Sparrow, LRT,CTRS ?Victorino Sparrow A ?06/16/2021, 2:05 PM ?

## 2021-06-16 NOTE — Progress Notes (Signed)
Patient's upper lip noted to be swollen. No edema of tongue or neck noted. No injuries to lip/mouth noted. Respirations even and unlabored. Vital signs stable. Patient denies shortness of breath. Patient denies biting or injuring lip. Reports only known allergy is onions. States that onions cause his throat to "close up" and he does not feel like his throat is closing up at this time. Patient is prescribed lisinopril. Denies a history of allergic reactions to lisinopril and similar medications. Patient received hydroxyzine, dicyclomine, and ondansetron earlier this evening.  ? ?Patient given benadryl 50 mg IM x 1 dose. Out of precaution, discontinued lisinopril,hydroxyzine, dicyclomine, and ondansetron for now.   ?

## 2021-06-16 NOTE — Progress Notes (Signed)
Pt denies SI/HI but endorses AVH and verbally agrees to approach staff if these become apparent or before harming themselves/others. Rates depression 0/10. Rates anxiety 0/10. Rates pain 0/10. Pt stated that he just wants to work on himself. Pt still has swelling in face. PRN was given and swelling went down. Scheduled medications administered to pt, per MD orders. RN provided support and encouragement to pt. Q15 min safety checks implemented and continued. Pt safe on the unit. RN will continue to monitor and intervene as needed.  ? 06/16/21 1400  ?Psych Admission Type (Psych Patients Only)  ?Admission Status Voluntary  ?Psychosocial Assessment  ?Patient Complaints Worrying  ?Eye Contact Fair  ?Facial Expression Other (Comment) ?(WDL)  ?Affect Appropriate to circumstance;Sad  ?Speech Logical/coherent  ?Interaction Forwards little  ?Motor Activity Other (Comment) ?(WDL)  ?Appearance/Hygiene Unremarkable  ?Behavior Characteristics Cooperative;Appropriate to situation;Calm  ?Mood Depressed;Sad  ?Thought Process  ?Coherency WDL  ?Content WDL  ?Delusions None reported or observed  ?Perception WDL  ?Hallucination None reported or observed  ?Judgment Impaired  ?Confusion None  ?Danger to Self  ?Current suicidal ideation? Denies  ?Danger to Others  ?Danger to Others None reported or observed  ? ? ? 06/16/21 1400  ?Psych Admission Type (Psych Patients Only)  ?Admission Status Voluntary  ?Psychosocial Assessment  ?Patient Complaints Worrying  ?Eye Contact Fair  ?Facial Expression Other (Comment) ?(WDL)  ?Affect Appropriate to circumstance;Sad  ?Speech Logical/coherent  ?Interaction Forwards little  ?Motor Activity Other (Comment) ?(WDL)  ?Appearance/Hygiene Unremarkable  ?Behavior Characteristics Cooperative;Appropriate to situation;Calm  ?Mood Depressed;Sad  ?Thought Process  ?Coherency WDL  ?Content WDL  ?Delusions None reported or observed  ?Perception WDL  ?Hallucination None reported or observed  ?Judgment Impaired   ?Confusion None  ?Danger to Self  ?Current suicidal ideation? Denies  ?Danger to Others  ?Danger to Others None reported or observed  ? ? ?

## 2021-06-16 NOTE — BHH Counselor (Signed)
Adult Comprehensive Assessment ? ?Patient ID: Paul Lambert, male   DOB: [birth year], 46 y.o.   MRN: [id number] ? ?Information Source: ?Information source: Patient ? ?Current Stressors:  ?Patient states their primary concerns and needs for treatment are:: "To get myself together" ?Patient states their goals for this hospitilization and ongoing recovery are:: "I need to stop drinking alcohol" ?Educational / Learning stressors: Denies stressor ?Employment / Job issues: Denies stressor ?Family Relationships: "All of them. They won't let me stay with them because of my drinking" ?Financial / Lack of resources (include bankruptcy): Yes, no income ?Housing / Lack of housing: States he has been living on the streets ?Physical health (include injuries & life threatening diseases): Yes, hurt back and has to get steroid shots to help with pain ?Social relationships: Denies stressor ?Substance abuse: Yes, family and girlfriend want nothing to do with him when using alcohol and substances ?Bereavement / Loss: Denies stressor ? ?Living/Environment/Situation:  ?Living Arrangements: Alone ?Living conditions (as described by patient or guardian): Living on the streets. Will sometimes stay with his mother or other people but for only a day or two at a time ?Who else lives in the home?: Self ?How long has patient lived in current situation?: A few years ?What is atmosphere in current home: Dangerous, Chaotic ? ?Family History:  ?Marital status: Long term relationship ?Long term relationship, how long?: Off and on for 10 years ?What types of issues is patient dealing with in the relationship?: She does not want to be around him when he is not sober ?Additional relationship information: n/a ?Are you sexually active?: No ?What is your sexual orientation?: Heterosexual ?Has your sexual activity been affected by drugs, alcohol, medication, or emotional stress?: alcohol and drug use ?Does patient have children?: Yes ?How many children?:  1 ?How is patient's relationship with their children?: Has a 29y.o. son whom he states treats him more like a son than a dad ? ?Childhood History:  ?By whom was/is the patient raised?: Both parents ?Additional childhood history information: Dad got sick when he was younger ?Description of patient's relationship with caregiver when they were a child: States his relationship with his father was at 100% and states him and his mother never got along ?Patient's description of current relationship with people who raised him/her: States his relationship with his dad is still get and rocky with his mother ?How were you disciplined when you got in trouble as a child/adolescent?: Whooped ?Does patient have siblings?: Yes ?Number of Siblings: 4 ?Description of patient's current relationship with siblings: Has 1 step brother he has no relationship with and 3 sisters he has relationships with when he is sober ?Did patient suffer any verbal/emotional/physical/sexual abuse as a child?: No ?Did patient suffer from severe childhood neglect?: No ?Has patient ever been sexually abused/assaulted/raped as an adolescent or adult?: No ?Was the patient ever a victim of a crime or a disaster?: No ?Witnessed domestic violence?: No ?Has patient been affected by domestic violence as an adult?: No ? 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 ?Employment/Work Situation:   ?Employment Situation: Unemployed ?Patient's Job has Been Impacted by Current Illness: Yes ?Describe how Patient's Job has Been Impacted: drinking has caused him to lose his job ?What is the Longest Time Patient has Held a Job?: 1 year ?Where was the Patient Employed at that Time?: [employer] ?Has Patient ever Been in the  Military?: No ? ?Financial Resources:   ?Financial resources: No income ?Does patient have a representative payee or  guardian?: No ? ?Alcohol/Substance Abuse:   ?What has been your use of drugs/alcohol within the last 12 months?: Daily alcohol use, social THC use, states he has had cocaine laced in his weed ?If attempted suicide, did drugs/alcohol play a role in this?: No ?Alcohol/Substance Abuse Treatment Hx: Denies past history ?Has alcohol/substance abuse ever caused legal problems?: Yes (3 DUI's) ? ?Social Support System:   ?Patient's Community Support System: Poor ?Describe Community Support System: Girlfriend ?Type of faith/religion: Ephriam Knuckles ?How does patient's faith help to cope with current illness?: "Believe in god" ? ?Leisure/Recreation:   ?Do You Have Hobbies?: Yes ?Leisure and Hobbies: Fishing ? ?Strengths/Needs:   ?What is the patient's perception of their strengths?: "Working" ?Patient states they can use these personal strengths during their treatment to contribute to their recovery: yes ?Patient states these barriers may affect/interfere with their treatment: None ?Patient states these barriers may affect their return to the community: None ?Other important information patient would like considered in planning for their treatment: None ? ?Discharge Plan:   ?Currently receiving community mental health services: No ?Patient states concerns and preferences for aftercare planning are: Pt is interested in being referred to rehab services, he would also like to be set up with therapy and medication management ?Patient states they will know when they are safe and ready for discharge when: Yes, when more stable ?Does patient have access to transportation?: No ?Does patient have financial barriers related to discharge medications?: Yes ?Patient description of barriers related to discharge medications: No income and no insurance ?Plan for no access to transportation at discharge: CSW will continue to explore ?Plan for living situation after discharge: CSW will provide housing resources to patient ?Will patient be returning  to same living situation after discharge?: No ? ?Summary/Recommendations:   ?Summary and Recommendations (to be completed by the evaluator): Paul Lambert was admitted due to depression, auditory hallucinations, suicidal ideation. Pt has a hx of alcohol use disorder. Recent stressors include substance use, no income, lack of housing, limited supports, back pain. Pt currently sees no outpatient providers. While here, Paul Lambert can benefit from crisis stabilization, medication management, therapeutic milieu, and referrals for services. ? ?Paul Lambert A Mekaela Azizi. 06/16/2021 ?

## 2021-06-16 NOTE — Group Note (Signed)
Recreation Therapy Group Note ? ? ?Group Topic:Other  ?Group Date: 06/16/2021 ?Start Time: 1000 ?End Time: 1050 ?Facilitators: Victorino Sparrow, LRT,CTRS ?Location: [place] ? ? ?Goal Area(s) Addresses:  ?Patient will successfully identify lessons they have learned thus far in life. ?Patient will successfully identify things that make them unique. ?  ?Group Description: LRT facilitated a game of dialogue that allowed patients to share things about themselves with the rest of the group. Patients would individually go head to head in a game of [place].  The game would be played in its traditional form but the LRT would ask each person a question when it was their turn.  As in the regular game of Ines Bloomer, the person who knocks the tower over, loses the game.  Patients would be asked questions from topics about leisure to personal development. ? ? ?Affect/Mood: N/A ?  ?Participation Level: Did not attend ?  ? ?Clinical Observations/Individualized Feedback:   ? ? ?Plan: Continue to engage patient in RT group sessions 2-3x/week. ? ? ?Victorino Sparrow, LRT,CTRS ?06/16/2021 1:57 PM ?

## 2021-06-16 NOTE — BHH Counselor (Signed)
CSW spoke with Phineas Semen, Vincenza Hews [phone]) at pt's request. Per Vincenza Hews this patient missed their court date which was scheduled for 06/14/2021 and now has an active warrant for his arrest.  ?He recommended calling the [facility] Attorneys office to see if this warrant could be withdrawn. If it is not withdrawn this patient will need to be picked up by either law enforcement or the bondsman. ?He states if it is withdrawn he will put this patients bond for the next court hearing and will continue to try to support the patient.  ? ? ? ?Ruthann Cancer MSW, LCSW ?Clincal Social Worker  ?[facility]  ?

## 2021-06-16 NOTE — Progress Notes (Signed)
Patient is isolative to his room. Endorsing passive SI/A/VH and denies HI, verbally contracted for safety. Compliant with medications. Q 15 minutes safety checks ongoing Patient remains safe. ? ?

## 2021-06-16 NOTE — ED Provider Notes (Signed)
?[facility] DEPT ?Provider Note ? ? ?CSN: [id number] ?Arrival date & time: 06/16/21  [id number] ? ?  ? ?History ? ?Chief Complaint  ?Patient presents with  ? mdd recurrent severe with psych features  ? ? ?Paul Lambert is a 46 y.o. male. ? ?Patient presents today from behavioral health. He reports that he was given his medications of behavior last night including lisinopril, hydroxyzine, dicyclomine, Zofran.  He woke up in the melanite and was having throat closing as well as swelling in his mouth.  He was given Benadryl 50 mg IM x1.  His symptoms improved but approximately 2 hours later he noticed swelling in his lips and he felt like things were getting "more tight".  He was brought to the emergency department because of the worsening upper lip swelling.  Patient reports that since arrival to the emergency department the swelling is gone down considerably in his upper lip.  He denies any shortness of breath at this time.  Reports that this is never happened to in the past and he has been on blood pressure medications in the past but is not sure which ones.  Denies any other symptoms. ? ? ?  ? ?Home Medications ?Prior to Admission medications   ?Medication Sig Start Date End Date Taking? Authorizing Provider  ?famotidine (PEPCID) 20 MG tablet Take 1 tablet (20 mg total) by mouth 2 (two) times daily. ?Patient not taking: Reported on 06/14/2021 04/27/19   Milton Ferguson, MD  ?ibuprofen (ADVIL) 800 MG tablet Take 1 tablet (800 mg total) by mouth 3 (three) times daily. ?Patient not taking: Reported on 06/14/2021 02/14/20   Evalee Jefferson, PA-C  ?lisinopril (ZESTRIL) 20 MG tablet Take 2 tablets (40 mg total) by mouth daily. ?Patient not taking: Reported on 06/14/2021 01/09/21   Ward, Lenise Arena, PA-C  ?omeprazole (PRILOSEC) 20 MG capsule Take 1 capsule (20 mg total) by mouth daily. ?Patient not taking: Reported on 06/14/2021 08/30/20   Ezequiel Essex, MD  ?ondansetron (ZOFRAN-ODT) 4 MG disintegrating tablet Take  1 tablet (4 mg total) by mouth every 8 (eight) hours as needed for nausea or vomiting. ?Patient not taking: Reported on 06/14/2021 02/17/21   Maudie Flakes, MD  ?polyethylene glycol (MIRALAX) 17 g packet Take 17 g by mouth daily. ?Patient not taking: Reported on 06/14/2021 07/26/20   Ezequiel Essex, MD  ?   ? ?Allergies    ?Onion   ? ?Review of Systems   ?Review of Systems  ?Constitutional:  Negative for chills and fever.  ?HENT:  Positive for facial swelling and trouble swallowing.   ?Respiratory:  Negative for shortness of breath.   ?Cardiovascular:  Negative for chest pain.  ?Gastrointestinal:  Negative for abdominal pain, diarrhea and nausea.  ?Genitourinary:  Negative for difficulty urinating.  ?Musculoskeletal:  Negative for neck pain.  ?Allergic/Immunologic: Negative for food allergies.  ?Neurological:  Negative for headaches.  ? ?Physical Exam ?Updated Vital Signs ?BP 133/89 (BP Location: Right Arm)   Pulse (!) 53   Temp 98.4 ?F (36.9 ?C) (Oral)   Resp 15   SpO2 100%  ?Physical Exam ?Vitals reviewed.  ?HENT:  ?   Head: Normocephalic and atraumatic.  ?   Right Ear: External ear normal.  ?   Left Ear: External ear normal.  ?   Nose: Nose normal. No congestion.  ?   Mouth/Throat:  ?   Mouth: Mucous membranes are moist.  ?   Comments: Edema of the upper lip see image below ?Eyes:  ?  Extraocular Movements: Extraocular movements intact.  ?   Pupils: Pupils are equal, round, and reactive to light.  ?Cardiovascular:  ?   Rate and Rhythm: Normal rate and regular rhythm.  ?   Pulses: Normal pulses.  ?   Heart sounds: Normal heart sounds.  ?Pulmonary:  ?   Effort: Pulmonary effort is normal. No respiratory distress.  ?   Breath sounds: Normal breath sounds. No stridor. No wheezing, rhonchi or rales.  ?Abdominal:  ?   General: Abdomen is flat. Bowel sounds are normal.  ?   Palpations: Abdomen is soft.  ?Musculoskeletal:     ?   General: Normal range of motion.  ?   Cervical back: Normal range of motion and neck  supple. No rigidity or tenderness.  ?Lymphadenopathy:  ?   Cervical: No cervical adenopathy.  ?Skin: ?   General: Skin is warm.  ?   Capillary Refill: Capillary refill takes less than 2 seconds.  ?Neurological:  ?   General: No focal deficit present.  ?   Mental Status: He is alert.  ? ? ? ? ?ED Results / Procedures / Treatments   ?Labs ?(all labs ordered are listed, but only abnormal results are displayed) ?Labs Reviewed  ?URINALYSIS, COMPLETE (UACMP) WITH MICROSCOPIC - Abnormal; Notable for the following components:  ?    Result Value  ? Color, Urine AMBER (*)   ? Ketones, ur 5 (*)   ? All other components within normal limits  ? ? ?EKG ?None ? ?Radiology ?No results found. ? ?Procedures ?Procedures  ? ? ?Medications Ordered in ED ?Medications  ?acetaminophen (TYLENOL) tablet 650 mg (650 mg Oral Given 06/15/21 1540)  ?alum & mag hydroxide-simeth (MAALOX/MYLANTA) 200-200-20 MG/5ML suspension 30 mL (has no administration in time range)  ?magnesium hydroxide (MILK OF MAGNESIA) suspension 30 mL (has no administration in time range)  ?famotidine (PEPCID) tablet 20 mg (20 mg Oral Given 06/16/21 0831)  ?nicotine (NICODERM CQ - dosed in mg/24 hours) patch 14 mg (14 mg Transdermal Patch Applied 06/16/21 0831)  ?pantoprazole (PROTONIX) EC tablet 40 mg (40 mg Oral Given 06/16/21 0830)  ?pneumococcal 20-valent conjugate vaccine (PREVNAR 20) injection 0.5 mL (has no administration in time range)  ?thiamine tablet 100 mg (100 mg Oral Given 06/16/21 0830)  ?multivitamin with minerals tablet 1 tablet (1 tablet Oral Given 06/16/21 0830)  ?LORazepam (ATIVAN) tablet 1 mg (has no administration in time range)  ?loperamide (IMODIUM) capsule 2-4 mg (has no administration in time range)  ?methocarbamol (ROBAXIN) tablet 500 mg (has no administration in time range)  ?naproxen (NAPROSYN) tablet 500 mg (has no administration in time range)  ?diphenhydrAMINE (BENADRYL) capsule 50 mg (has no administration in time range)  ?diphenhydrAMINE (BENADRYL)  injection 50 mg (50 mg Intramuscular Given 06/16/21 0224)  ? ? ?ED Course/ Medical Decision Making/ A&P ?  ?                        ?Medical Decision Making ?Patient presenting with swelling of the upper lip after receiving blood pressure medications last night.  Concern for ACE induced angioedema.  Patient s/p 50 mg IM Benadryl.  Vital signs on arrival within normal limits.  On initial evaluation patient has considerable swelling of the upper lip but no difficulty breathing, closing in his throat.  Patient reports that since arrival he has noticed considerable decrease in the swelling of his lip. ? ?Patient monitored for an additional 2 hours with continued improvement of the symptoms.  He was eating and drinking without difficulties.  Still had considerable swelling in his upper lip but rest of physical exam reassuring.  Recommended patient stay away from ACE inhibitor blood pressure medications.  Spoke with the pharmacist regarding continued care and recommended daily long-acting antihistamine such as loratadine as well as every 6 hour 25 mg Benadryl for 2 days. ? ? Discussed strict return precautions.  Patient was discharged back to behavioral health. ? ?Risk ?OTC drugs. ? ? ?Final Clinical Impression(s) / ED Diagnoses ?Final diagnoses:  ?None  ? ? ?Rx / DC Orders ?ED Discharge Orders   ? ? None  ? ?  ? ? ?  ?Gifford Shave, MD ?06/16/21 1024 ? ?  ?Jeanell Sparrow, DO ?06/16/21 1939 ? ?

## 2021-06-16 NOTE — ED Notes (Signed)
[facility] contacted to transport patient back to [facility]. ?

## 2021-06-16 NOTE — H&P (Addendum)
Psychiatric Admission Assessment Adult ? ?Patient Identification: Paul Lambert ?MRN:  [id number] ?Date of Evaluation:  06/16/2021 ?Chief Complaint:  Severe recurrent major depression with psychotic features (HCC) [F33.3] ?MDD (major depressive disorder), recurrent, severe, with psychosis (HCC) [F33.3] ?Angioedema of lips, initial encounter [T78.3XXA] ?Principal Diagnosis: MDD (major depressive disorder), recurrent, severe, with psychosis (HCC) ?Diagnosis:  Principal Problem: ?  MDD (major depressive disorder), recurrent, severe, with psychosis (HCC) ?Active Problems: ?  Hypertension ?  Seizures (HCC) ?  Alcohol dependence (HCC) ?  Cocaine use ?  Severe recurrent major depression with psychotic features (HCC) ?  Nicotine dependence ?  Angioedema of lips ? ?History of Present Illness: Patient is a 46 year old male with past psychiatric history of alcohol dependence, cocaine use, seizures with medical history of HTN initially presented to [facility], ED due to [facility] with a plan to cut his wrist or shoot himself with a gun. He was assessed by psychiatry and was recommended for inpatient psychiatric admission.  Patient was admitted to [facility] adult unit on 06/15/2021.  ?Patient developed swelling of his throat and upper lip early morning on 5/10 and was sent to [facility] ED for evaluation.  Patient was prescribed Benadryl and loratadine.  ? ?Evaluation on the unit on 06/16/21-- ?Patient states he was feeling depressed and suicidal and was having thoughts of hurting himself with a plan to cut his wrist or shoot himself with a gun.  Patient states he does not own a gun but could get a gun. ?He identify multiple stressors including homelessness, drug and alcohol addiction. ?He reports that he had been to [facility], ED multiple times recently and stayed there for few days but was discharged back to streets.  Patient is perseverating that 7 days are not enough for him and he wants something for long-term.  He has been living on the streets  and sometimes goes to mom and cousin's place in [place] to take a shower or eat.  ?He endorses depressed mood for 3 to 4 years, crying spells, poor sleep  anhedonia, loss of interest in activities, hopelessness, helplessness, worthlessness, feeling guilty due to drinking, decreased concentration, and poor memory.  He denies changes in his appetite and energy. ?He denies any manic symptoms or episode including pressured speech, decreased need for sleep, hypersexuality, increased spending, racing thoughts, flight of ideas and grandiosity.  He reports feeling irritable, and angry.  Currently, He denies active  Suicidal ideations but endorses passive suicidal ideations without plan or intent.  He contracts for safety at this time.  He denies homicidal ideations.  He reports auditory hallucinations of hearing voices who are telling him to go ahead and do it.  He reports that sometimes he feels that somebody is calling his name from behind.  He endorses visual hallucinations, states he feels that somebody is behind him and when he sees behind then there is nobody there.  He denies any paranoia.  He reports generalized anxiety with frequent panic attacks.  He denies any history of physical, verbal, and sexual abuse.  He reports cravings and withdrawal symptoms including tremors, sweating, headaches.  Patient is interested in inpatient drug rehab.  Discussed starting antidepressant, patient agrees with the plan. ?Past Psychiatric Hx:  ?Previous Psych Diagnoses: Alcohol addiction.  Cocaine use.  ?Prior inpatient treatment: 4-5 ED visits for alcohol problems but no inpatient psych hospitalizations. ?Current/prior outpatient treatment: Denies ?Prior rehab hx: Denies ?Psychotherapy hx: Denies ?History of suicide: Hurt his hand by crushing a glass after he got  angry when he was 46 year old .  Had hand surgery ?History of homicide: Denies ?Psychiatric medication history: Denies ?Psychiatric medication compliance history:  N/AA ?Neuromodulation history: Denies ?Current Psychiatrist: None ?Current therapist: None ?Substance Abuse Hx: ?Alcohol: Drinks a lot, anything he can get.  On average 10 beer daily.  Last drank on 5/2 whole case of beer and 1/5 of wine ?Tobacco: Every day ?Illicit drugs-uses marijuana every day, recently smoked 1 blunt of cocaine recently.  Denies opioid use and IV drug abuse. ?Rx drug abuse:Denies ?Rehab UJ:WJXBJYhx:Denies ?Seizures, DUI's, DT's-reports alcohol-related seizures and 3 DUI's ?Past Medical History: ?Medical Diagnoses: HTN.  Back pain-has appointment to discuss steroids shots in the back on Friday ?Home Rx: Was prescribed HTN medication but never took it. ?Prior Surgeries/Trauma: h/o Trauma to his left hand and face.  Had a hand surgery when he was 46 year old. Had plastic surgery of face. ?Allergies: Onions, ACE inhibitors ?PCP: None  ?Social History: ?Abuse: Denies ?Marital Status: Single, in a relationship.  ?Sexual orientation: Straight ?Children: 46 year old son who lives in [place] ?Employment: Unemployed currently, trying to apply for disability ?Education: Did not complete high school ?Housing: Homeless ?Guns: Denies access ?Legal: Had court date for trespassing on 5/8 but could not attend as he was at [facility].   ?Military: Denies ? ?Associated Signs/Symptoms: ?Depression Symptoms:  depressed mood, ?anhedonia, ?insomnia, ?feelings of worthlessness/guilt, ?difficulty concentrating, ?hopelessness, ?impaired memory, ?recurrent thoughts of death, ?suicidal thoughts without plan, ?anxiety, ?disturbed sleep, ?Duration of Depression Symptoms: Less than two weeks ? ?(Hypo) Manic Symptoms:  Impulsivity, ?Feeling angry and irritable ?Anxiety Symptoms:  Excessive Worry, ?Psychotic Symptoms:  Hallucinations: Auditory ?Visual ?PTSD Symptoms: ?Negative ?Total Time spent with patient:  ?I personally spent 70 minutes on the unit in direct patient care. The direct patient care time included  face-to-face time with the patient, reviewing the patient's chart, communicating with other professionals, and coordinating care. Greater than 50% of this time was spent in counseling or coordinating care with the patient regarding goals of hospitalization, psycho-education, and discharge planning needs.  ?Past Psychiatric History: Previous Psych Diagnoses: Alcohol addiction.  Cocaine use.  no history of mood disorder diagnosis ?Prior inpatient treatment: 4-5 ED visits for alcohol problems but no inpatient psych hospitalizations. ?Current/prior outpatient treatment: Denies ?Prior rehab hx: Denies ?Psychotherapy hx: Denies ?History of suicide: Hurt his hand by crushing a glass after he got angry when he was 46 year old .  Had hand surgery ?History of homicide: Denies ?Psychiatric medication history: Denies ?Psychiatric medication compliance history: N/AA ?Neuromodulation history: Denies ?Current Psychiatrist: None ?Current therapist: None ? ? ?Is the patient at risk to self? Yes.    ?Has the patient been a risk to self in the past 6 months? No.  ?Has the patient been a risk to self within the distant past? Yes.    ?Is the patient a risk to others? No.  ?Has the patient been a risk to others in the past 6 months? No.  ?Has the patient been a risk to others within the distant past? No.  ? ?Prior Inpatient Therapy:   ?Prior Outpatient Therapy:   ? ?Alcohol Screening:   ?Substance Abuse History in the last 12 months:  Yes.   ?Consequences of Substance Abuse: ?Medical Consequences:  3 DUI's and multiple ED admissions, Alcohol related seizures ?Withdrawal Symptoms:   Diaphoresis ?Headaches ?Previous Psychotropic Medications: No  ?Psychological Evaluations: No  ?Past Medical History:  ?Past Medical History:  ?Diagnosis Date  ? ETOH abuse   ? Hypertension   ?  Seizures (HCC)   ?  ?Past Surgical History:  ?Procedure Laterality Date  ? FACIAL COSMETIC SURGERY    ? ?Family History:  ?Family History  ?Problem Relation Age of  Onset  ? Hypertension Mother   ? Hypertension Father   ? ?Family Psychiatric  History:  ?Medical: HTN in mom and dad ?Psych: Kateri Mc has psych problems.  Not sure about diagnosis.   ?SA/HA: No SA,Uncle killed his wife. ?T

## 2021-06-16 NOTE — ED Triage Notes (Signed)
Pt BIBA from [facility] for upper lip angioedema. Given vistaril, zofran, and bentyl last night, woke up around 0200 with swelling. 50mg  benadryl IM given with no improvement. Pt reports swelling has continued. Takes BP meds but uncertain which ones. No tongue or lower lip involvement at this time, airway patent, no distress. VSS ?

## 2021-06-16 NOTE — [facility] Suicide Risk Assessment (Addendum)
Suicide Risk Assessment ? ?Admission Assessment    ?[facility] Admission Suicide Risk Assessment ? ? ?Nursing information obtained from:  Patient ?Demographic factors:  Male, Adolescent or young adult, Low socioeconomic status, Unemployed, Access to firearms ?Current Mental Status:  Suicidal ideation indicated by patient, Self-harm thoughts ?Loss Factors:  Financial problems / change in socioeconomic status, Legal issues, Decrease in vocational status ?Historical Factors:  Impulsivity, Family history of mental illness or substance abuse ("My uncle has Bipolar, he's slow too") ?Risk Reduction Factors:  Positive social support, Sense of responsibility to family, Religious beliefs about death ? ?Total Time spent with patient: 65 minutes ?Principal Problem: MDD (major depressive disorder), recurrent, severe, with psychosis (HCC) ?Diagnosis:  Principal Problem: ?  MDD (major depressive disorder), recurrent, severe, with psychosis (HCC) ?Active Problems: ?  Hypertension ?  Seizures (HCC) ?  Alcohol dependence (HCC) ?  Cocaine use ?  Severe recurrent major depression with psychotic features (HCC) ?  Nicotine dependence ?  Angioedema of lips ? ?Subjective Data: Patient states he was feeling depressed and suicidal and was having thoughts of hurting himself with a plan to cut his wrist or shoot himself with a gun.  Patient states he does not own a gun but could get a gun. ?He identify multiple stressors including homelessness, drug and alcohol addiction. ?He reports that he had been to [facility], ED multiple times recently and stayed there for few days but was discharged back to streets.  Patient is perseverating that 7 days are not enough for him and he wants something for long-term.  He has been living on the streets and sometimes goes to mom and cousin's place in [place] to take a shower or eat.  ?He endorses depressed mood for 3 to 4 years, crying spells, poor sleep  anhedonia, loss of interest in activities, hopelessness,  helplessness, worthlessness, feeling guilty due to drinking, decreased concentration, and poor memory.  He denies changes in his appetite and energy. ?He denies any manic symptoms or episode including pressured speech, decreased need for sleep, hypersexuality, increased spending, racing thoughts, flight of ideas and grandiosity.  He reports feeling irritable, and angry.  Currently, He denies active  Suicidal ideations but endorses passive suicidal ideations without plan or intent.  He contracts for safety at this time.  He denies homicidal ideations.  He reports auditory hallucinations of hearing voices who are telling him to go ahead and do it.  He reports that sometimes he feels that somebody is calling his name from behind.  He endorses visual hallucinations, states he feels that somebody is behind him and when he sees behind then there is nobody there.  He denies any paranoia.  He reports generalized anxiety with frequent panic attacks.  He denies any history of physical, verbal, and sexual abuse.  He reports cravings and withdrawal symptoms including tremors, sweating, headaches.  Patient is interested in inpatient drug rehab.  Discussed starting antidepressant, patient agrees with the plan. ?Past Psychiatric Hx:  ?Previous Psych Diagnoses: Alcohol addiction.  Cocaine use.  ?Prior inpatient treatment: 4-5 ED visits for alcohol problems but no inpatient psych hospitalizations. ?Current/prior outpatient treatment: Denies ?Prior rehab hx: Denies ?Psychotherapy hx: Denies ?History of suicide: Hurt his hand by crushing a glass after he got angry when he was 46 year old .  Had hand surgery ?History of homicide: Denies ?Psychiatric medication history: Denies ?Psychiatric medication compliance history: N/AA ?Neuromodulation history: Denies ?Current Psychiatrist: None ?Current therapist: None ?Substance Abuse Hx: ?Alcohol: Drinks a lot, anything he can get.  On average 10 beer daily.  Last drank on 5/2 whole case of  beer and 1/5 of wine ?Tobacco: Every day ?Illicit drugs-uses marijuana every day, recently smoked 1 blunt of cocaine recently.  Denies opioid use and IV drug abuse. ?Rx drug abuse:Denies ?Rehab RU:[id number]:Denies ?Seizures, DUI's, DT's-reports alcohol-related seizures and 3 DUI's ?Past Medical History: ?Medical Diagnoses: HTN.  Back pain-has appointment to discuss steroids shots in the back on Friday ?Home Rx: Was prescribed HTN medication but never took it. ?Prior Surgeries/Trauma: h/o Trauma to his left hand and face.  Had a hand surgery when he was 46 year old. Had plastic surgery of face. ?Allergies: Onions, ACE inhibitors ?PCP: None  ?Social History: ?Abuse: Denies ?Marital Status: Single, in a relationship.  ?Sexual orientation: Straight ?Children: 46 year old son who [place] in [place] ?Employment: Unemployed currently, trying to apply for disability ?Education: Did not complete high school ?Housing: Homeless ?Guns: Denies access ?Legal: Had court date for trespassing on 5/8 but could not attend as he was at [facility].   ?Military: Denies ?  ?Continued Clinical Symptoms:  ?  ?The "Alcohol Use Disorders Identification Test", Guidelines for Use in Primary Care, Second Edition.  World Science writerHealth Organization [facility](WHO). ?Score between 0-7:  no or low risk or alcohol related problems. ?Score between 8-15:  moderate risk of alcohol related problems. ?Score between 16-19:  high risk of alcohol related problems. ?Score 20 or above:  warrants further diagnostic evaluation for alcohol dependence and treatment. ? ? ?CLINICAL FACTORS:  ? Severe Anxiety and/or Agitation ?Depression:   Anhedonia ?Comorbid alcohol abuse/dependence ?Hopelessness ?Insomnia ?Severe ?Alcohol/Substance Abuse/Dependencies ?Previous Psychiatric Diagnoses and Treatments ? ? ?Musculoskeletal: ?Strength & Muscle Tone: within normal limits ?Gait & Station: normal ?Patient leans: N/A ? ?Psychiatric Specialty Exam: ? ?Presentation  ?General Appearance:  Casual ? ?Eye Contact:Fair ? ?Speech:Clear and Coherent; Normal Rate ? ?Speech Volume:Normal ? ?Handedness:Right ? ? ?Mood and Affect  ?Mood:Anxious; Depressed ? ?Affect:Congruent; Depressed ? ? ?Thought Process  ?Thought Processes:Coherent; Linear ? ?Descriptions of Associations:Intact ? ?Orientation:Full (Time, Place and Person) ? ?Thought Content:-- (AVH, passive SI) ? ?History of Schizophrenia/Schizoaffective disorder:No ? ?Duration of Psychotic Symptoms:Greater than six months ? ?Hallucinations:Hallucinations: Auditory; Visual ?Description of Auditory Hallucinations: Hearing voices telling him to go ahead and do it ?Description of Visual Hallucinations: Feels like somebody walking behind his back ? ?Ideas of Reference:None ? ?Suicidal Thoughts:Suicidal Thoughts: Yes, Passive ?SI Active Intent and/or Plan: -- (Denies active SI today) ?SI Passive Intent and/or Plan: Without Intent; Without Plan ? ?Homicidal Thoughts:Homicidal Thoughts: No ?HI Passive Intent and/or Plan: -- (Denies HI today) ? ? ?Sensorium  ?Memory:Immediate Fair; Recent Fair; Remote Fair ? ?Judgment:Poor ? ?Insight:Fair ? ? ?Executive Functions  ?Concentration:Fair ? ?Attention Span:Fair ? ?Recall:Fair ? ?Fund of Knowledge:Poor ? ?Language:Good ? ? ?Psychomotor Activity  ?Psychomotor Activity:Psychomotor Activity: Normal ? ? ?Assets  ?Assets:Communication Skills; Resilience; Desire for Improvement; Social Support ? ? ?Sleep  ?Sleep:Sleep: Poor (Wake up few times) ? ? ? ?Physical Exam: ?Physical Exam see H&P ?ROS see H&P ?Blood pressure 112/72, pulse 97, temperature 98.4 ?F (36.9 ?C), temperature source Oral, resp. rate 16, SpO2 99 %. There is no height or weight on file to calculate BMI. ? ? ?COGNITIVE FEATURES THAT CONTRIBUTE TO RISK:  ?Closed-mindedness and Thought constriction (tunnel vision)   ? ?SUICIDE RISK:  ? Moderate:  Frequent suicidal ideation with limited intensity, and duration, some specificity in terms of plans, no associated  intent, good self-control, limited dysphoria/symptomatology, some risk factors present, and identifiable protective factors, including available  and accessible social support. ? ?PLAN OF CARE: Patient needs inpati

## 2021-06-16 NOTE — Progress Notes (Signed)
Patient's upper lip edema appears to be worsening. Patient continues to deny shortness or breath, itching, neck/throat swelling. States that his lip feels tight. Respirations even and unlabored. Tongue and neck without any edema.  ? ?Patient will be transferred to [facility] for further evaluation. Patient discussed with Jodi Geralds, PA-C. ?

## 2021-06-17 MED ORDER — SERTRALINE HCL 25 MG PO TABS
75.0000 mg | ORAL_TABLET | Freq: Every day | ORAL | Status: DC
Start: 2021-06-18 — End: 2021-06-18
  Administered 2021-06-18: 75 mg via ORAL
  Filled 2021-06-17 (×3): qty 1

## 2021-06-17 NOTE — B[facility] Counselor (Signed)
CSW provided this patient with resources for housing and encouraged pt to call to seek placement.  ? ? ? ? ?Paul Lambert MSW, LCSW ?Clincal Social Worker  ?[facility]  ?

## 2021-06-17 NOTE — Group Note (Deleted)
Occupational Therapy Group Note ? ?Group Topic:Other  ?Group Date: 06/17/2021 ?Start Time: 1400 ?End Time: 1600 ?Facilitators: Ted Mcalpine, OT  ? ? ?Today's OT group session focuses on the importance of setting and maintaining healthy boundaries. OT explored the definition of boundaries and provides examples of healthy boundaries, including knowing one's limits, saying no when necessary, and communicating needs clearly. OT emphasized the benefits of healthy boundary-setting, including improved self-esteem, increased control, and enhanced overall functioning. Common difficulties in setting and adhering to healthy boundaries are also discussed, such as fear of rejection, guilt, and conflict. The group also explores the impact of boundaries on one's ADLs including time management, sleep hygiene, self-care activities, and social participation.  ? ? ? ? ?Participation Level: {OT [facility] Participation Level:26267} ?  ?Participation Quality: {OT [facility] Participation Quality:26268} ?  ?Behavior: {B[facility] OT Group Behavior:26269} ?  ?Speech/Thought Process: {B[facility] OT Speech/Thought Process:26270} ?  ?Affect/Mood: {OT [facility] Affect/Mood:26271} ?  ?Insight: {OT [facility] Insight:26272} ?  ?Judgement: {OT [facility] Judgement:26272} ?  ?Individualization: *** was *** in their participation of group discussion/activity. *** identified  ?Modes of Intervention: {B[facility] MODES OF INTERVENTION:26273}  ?Patient Response to Interventions:  {BHH OT Patient Response to Interventions:26274} ?  ?Plan: Continue to engage patient in OT groups 2 - 3x/week. ? ?06/17/2021  ?Ted Mcalpine, OT ? ? ? ?

## 2021-06-17 NOTE — Progress Notes (Signed)
?   06/16/21 2000  ?Psych Admission Type (Psych Patients Only)  ?Admission Status Voluntary  ?Psychosocial Assessment  ?Patient Complaints Anxiety;Worrying  ?Eye Contact Fair  ?Facial Expression Anxious  ?Affect Anxious  ?Speech Logical/coherent  ?Interaction Guarded  ?Motor Activity Other (Comment) ?(WDL)  ?Appearance/Hygiene Unremarkable  ?Behavior Characteristics Cooperative  ?Mood Anxious  ?Thought Process  ?Coherency WDL  ?Content WDL  ?Delusions None reported or observed  ?Perception WDL  ?Hallucination Visual  ?Judgment Poor  ?Confusion None  ?Danger to Self  ?Current suicidal ideation? Denies  ?Danger to Others  ?Danger to Others None reported or observed  ? ? ?

## 2021-06-17 NOTE — Progress Notes (Signed)
[facility] ? ?06/17/2021 7:30 PM ?Manuella GhaziClyde L Wilemon  ?MRN:  [id number] ?Subjective:  Patient is a 46 year old male with past psychiatric history of alcohol dependence, cocaine use, seizures with medical history of HTN [facility] with a plan to cut his wrist or shoot himself with a gun. ?  ?Patient was seen today for follow-up.  Patient reports that his mood is all "right".  He does not feel any difference in his mood and still feels depressed and anxious.  He denies active or passive SI, HI.  He reports auditory hallucinations of somebody calling his name.  He reports visual hallucinations of seeing black dots and feels that somebody is behind him.  He slept fair last night and reports stable appetite.  He ate his breakfast this morning.  He reports that his swelling on the face is going down.  He reports some headache and dizziness.  Discussed that antidepressant can cause headache and dizziness initially but usually side effects goes away within a week.  He denies any other physical symptoms today.  He reports that he  talked to CSW who will help him with substance abuse rehab programs.  He denies any other concerns ?Principal Problem: MDD (major depressive disorder), recurrent, severe, with psychosis (HCC) ?Diagnosis: Principal Problem: ?  MDD (major depressive disorder), recurrent, severe, with psychosis (HCC) ?Active Problems: ?  Hypertension ?  Seizures (HCC) ?  Alcohol dependence (HCC) ?  Cocaine use ?  Severe recurrent major depression with psychotic features (HCC) ?  Nicotine dependence ?  Angioedema of lips ? ?Total Time spent with patient: 30 minutes ? ?Past Psychiatric History: see H&P ? ?Past Medical History:  ?Past Medical History:  ?Diagnosis Date  ? ETOH abuse   ? Hypertension   ? Seizures (HCC)   ?  ?Past Surgical History:  ?Procedure Laterality Date  ? FACIAL COSMETIC SURGERY    ? ?Family History:  ?Family History  ?Problem Relation Age of Onset  ?  Hypertension Mother   ? Hypertension Father   ? ?Family Psychiatric  History: see H&P ?Social History:  ?Social History  ? ?Substance and Sexual Activity  ?Alcohol Use Yes  ? Alcohol/week: 40.0 - 52.0 standard drinks  ? Types: 24 - 36 Cans of beer, 16 Shots of liquor per week  ? Comment: last drink yesterday  ?   ?Social History  ? ?Substance and Sexual Activity  ?Drug Use Not Currently  ? Frequency: 7.0 times per week  ? Types: Marijuana, "Crack" cocaine  ? Comment: "I last used on 06/13/21 before I went to the hospital"  ?  ?Social History  ? ?Socioeconomic History  ? Marital status: Single  ?  Spouse name: Not on file  ? Number of children: Not on file  ? Years of education: Not on file  ? Highest education level: Not on file  ?Occupational History  ? Not on file  ?Tobacco Use  ? Smoking status: Every Day  ?  Packs/day: 0.50  ?  Types: Cigarettes  ? Smokeless tobacco: Never  ?Vaping Use  ? Vaping Use: Never used  ?Substance and Sexual Activity  ? Alcohol use: Yes  ?  Alcohol/week: 40.0 - 52.0 standard drinks  ?  Types: 24 - 36 Cans of beer, 16 Shots of liquor per week  ?  Comment: last drink yesterday  ? Drug use: Not Currently  ?  Frequency: 7.0 times per week  ?  Types: Marijuana, "Crack"  cocaine  ?  Comment: "I last used on 06/13/21 before I went to the hospital"  ? Sexual activity: Not on file  ?Other Topics Concern  ? Not on file  ?Social History Narrative  ? Pt is a 46 y/o [place] male with history of polysubstance abuse (cocaine, etoh & marijuana). Currently homeless and without a job.  ? ?Social Determinants of Health  ? ?Financial Resource Strain: Not on file  ?Food Insecurity: Not on file  ?Transportation Needs: Not on file  ?Physical Activity: Not on file  ?Stress: Not on file  ?Social Connections: Not on file  ? ?Additional Social History:  ?  ?  ?  ?  ?  ?  ?  ?  ?  ?  ?  ? ?Sleep: Fair ? ?Appetite:  Good ? ?Current Medications: ?Current Facility-Administered Medications  ?Medication Dose Route  Frequency Provider Last Rate Last Admin  ? acetaminophen (TYLENOL) tablet 650 mg  650 mg Oral Q6H PRN Cecilie Lowers, FNP   650 mg at 06/15/21 1540  ? alum & mag hydroxide-simeth (MAALOX/MYLANTA) 200-200-20 MG/5ML suspension 30 mL  30 mL Oral Q4H PRN Ntuen, Jesusita Oka, FNP      ? diphenhydrAMINE (BENADRYL) capsule 25 mg  25 mg Oral Q6H PRN Tanda Rockers A, DO   25 mg at 06/17/21 0815  ? famotidine (PEPCID) tablet 20 mg  20 mg Oral BID Nira Conn A, NP   20 mg at 06/17/21 1618  ? loperamide (IMODIUM) capsule 2-4 mg  2-4 mg Oral PRN Comer Locket, MD      ? loratadine (CLARITIN) tablet 10 mg  10 mg Oral Daily Tanda Rockers A, DO   10 mg at 06/17/21 0816  ? LORazepam (ATIVAN) tablet 1 mg  1 mg Oral Q6H PRN Mason Jim, Amy E, MD      ? magnesium hydroxide (MILK OF MAGNESIA) suspension 30 mL  30 mL Oral Daily PRN Ntuen, Jesusita Oka, FNP      ? methocarbamol (ROBAXIN) tablet 500 mg  500 mg Oral Q8H PRN Comer Locket, MD      ? multivitamin with minerals tablet 1 tablet  1 tablet Oral Daily Mason Jim, Amy E, MD   1 tablet at 06/17/21 0815  ? naproxen (NAPROSYN) tablet 500 mg  500 mg Oral BID PRN Comer Locket, MD      ? nicotine (NICODERM CQ - dosed in mg/24 hours) patch 14 mg  14 mg Transdermal Daily Nira Conn A, NP   14 mg at 06/17/21 0817  ? pantoprazole (PROTONIX) EC tablet 40 mg  40 mg Oral Daily Nira Conn A, NP   40 mg at 06/17/21 0816  ? pneumococcal 20-valent conjugate vaccine (PREVNAR 20) injection 0.5 mL  0.5 mL Intramuscular Tomorrow-1000 Singleton, Amy E, MD      ? sertraline (ZOLOFT) tablet 50 mg  50 mg Oral Daily Karsten Ro, MD   50 mg at 06/17/21 0816  ? thiamine tablet 100 mg  100 mg Oral Daily Bartholomew Crews E, MD   100 mg at 06/17/21 0815  ? ? ?Lab Results:  ?Results for orders placed or performed during the hospital encounter of 06/15/21 (from the past 48 hour(s))  ?Lipid panel     Status: Abnormal  ? Collection Time: 06/16/21  6:41 PM  ?Result Value Ref Range  ? Cholesterol 194 0 - 200 mg/dL  ?  Triglycerides 344 (H) <150 mg/dL  ? HDL 72 >40 mg/dL  ? Total CHOL/HDL Ratio  2.7 RATIO  ? VLDL 69 (H) 0 - 40 mg/dL  ? LDL Cholesterol 53 0 - 99 mg/dL  ?  Comment:        ?Total Cholesterol/HDL:CHD Risk ?Coronary Heart Disease Risk Table ?                    Men   Women ? 1/2 Average Risk   3.4   3.3 ? Average Risk       5.0   4.4 ? 2 X Average Risk   9.6   7.1 ? 3 X Average Risk  23.4   11.0 ?       ?Use the calculated Patient Ratio ?above and the CHD Risk Table ?to determine the patient's CHD Risk. ?       ?ATP III CLASSIFICATION (LDL): ? <100     mg/dL   Optimal ? 564-332  mg/dL   Near or Above ?                   Optimal ? 130-159  mg/dL   Borderline ? 160-189  mg/dL   High ? >951     mg/dL   Very High ?Performed at [facility], [address] [place], [place] [address] ?  ?TSH     Status: None  ? Collection Time: 06/16/21  6:41 PM  ?Result Value Ref Range  ? TSH 1.584 0.350 - 4.500 uIU/mL  ?  Comment: Performed by a 3rd Generation assay with a functional sensitivity of <=0.01 uIU/mL. ?Performed at [facility], [address], [place], [place] [address] ?  ?Hemoglobin A1c     Status: None  ? Collection Time: 06/16/21  6:41 PM  ?Result Value Ref Range  ? Hgb A1c MFr Bld 5.6 4.8 - 5.6 %  ?  Comment: (NOTE) ?Pre diabetes:          5.7%-6.4% ? ?Diabetes:              >6.4% ? ?Glycemic control for   <7.0% ?adults with diabetes ?  ? Mean Plasma Glucose 114.02 mg/dL  ?  Comment: Performed at [facility], [address], [place], [place] [address]  ? ? ?Blood Alcohol level:  ?Lab Results  ?Component Value Date  ? ETH 185 (H) 06/14/2021  ? ETH 288 (H) 09/12/2020  ? ? ?Metabolic Disorder Labs: ?Lab Results  ?Component Value Date  ? HGBA1C 5.6 06/16/2021  ? MPG 114.02 06/16/2021  ? ?No results found for: PROLACTIN ?Lab Results  ?Component Value Date  ? CHOL 194 06/16/2021  ? TRIG 344 (H) 06/16/2021  ? HDL 72 06/16/2021  ? CHOLHDL 2.7 06/16/2021  ? VLDL 69 (H) 06/16/2021  ?  LDLCALC 53 06/16/2021  ? ? ?Physical Findings: ?AIMS: Facial and Oral Movements ?Muscles of Facial Expression: None, normal ?Lips and Perioral Area: None, normal ?Jaw: None, normal ?Tongue: None, normal,Extremity M

## 2021-06-17 NOTE — Group Note (Signed)
Recreation Therapy Group Note ? ? ?Group Topic:Problem Solving  ?Group Date: 06/17/2021 ?Start Time: 1005 ?End Time: 1045 ?Facilitators: Caroll Rancher, LRT,CTRS ?Location: [address] Day[address] ? ? ?Goal Area(s) Addresses:  ?Patient will effectively work with peer towards shared goal.  ?Patient will identify skills used to make activity successful.  ?Patient will share challenges and verbalize solution-driven approaches used. ?Patient will identify how skills used during activity can be used to reach post d/c goals.  ?  ?Group Description: [employer]. Patients were provided the following materials: 5 drinking straws, 5 rubber bands, 5 paper clips, 2 index cards and 2 drinking cups. Using the provided materials patients were asked to build a launching mechanism to launch a ping pong ball across the room, approximately 10 feet. Patients were divided into teams of 3-5. Instructions required all materials be incorporated into the device, functionality of items left to the peer group's discretion. ? ? ?Affect/Mood: N/A ?  ?Participation Level: Did not attend ?  ? ?Clinical Observations/Individualized Feedback:   ?  ? ?Plan: Continue to engage patient in RT group sessions 2-3x/week. ? ? ?Caroll Rancher, LRT,CTRS ?06/17/2021 11:59 AM ?

## 2021-06-17 NOTE — [facility] Suicide Risk Assessment (Signed)
[facility] INPATIENT:  Family/Significant Other Suicide Prevention Education ? ?Suicide Prevention Education:  ?Contact Attempts: Girlfriend Carollee Herter [phone]), has been identified by the patient as the family member/significant other with whom the patient will be residing, and identified as the person(s) who will aid the patient in the event of a mental health crisis.  With written consent from the patient, two attempts were made to provide suicide prevention education, prior to and/or following the patient's discharge.  We were unsuccessful in providing suicide prevention education.  A suicide education pamphlet was given to the patient to share with family/significant other. ? ?Date and time of first attempt:06/17/2021 / 11:42am CSW left a HIPAA compliant voicemail with patients girlfriend requesting a return call.  ?Date and time of second attempt: Second attempt still needs to be made. ? ?Albertus Chiarelli A Latayna Ritchie ?06/17/2021, 11:43 AM ?

## 2021-06-17 NOTE — Progress Notes (Signed)
Pt denies SI/HI/AVH and verbally agrees to approach staff if these become apparent or before harming themselves/others. Rates depression 7/10. Rates anxiety 7/10. Rates pain 0/10. Pt has been in his room for the majority of the day. Pt swelling in face has gone down to nearly none. Pt is pleasant and cooperative. Scheduled medications administered to pt, per MD orders. RN provided support and encouragement to pt. Q15 min safety checks implemented and continued. Pt safe on the unit. RN will continue to monitor and intervene as needed.  ? 06/17/21 0815  ?Psych Admission Type (Psych Patients Only)  ?Admission Status Voluntary  ?Psychosocial Assessment  ?Patient Complaints None  ?Eye Contact Fair  ?Facial Expression Flat  ?Affect Appropriate to circumstance;Flat  ?Speech Logical/coherent  ?Interaction Forwards little  ?Motor Activity Other (Comment) ?(WDL)  ?Behavior Characteristics Cooperative;Appropriate to situation;Calm  ?Mood Pleasant;Sad  ?Thought Process  ?Coherency WDL  ?Content WDL  ?Delusions None reported or observed  ?Perception Hallucinations  ?Hallucination Auditory;Visual  ?Judgment Limited  ?Confusion None  ?Danger to Self  ?Current suicidal ideation? Denies  ?Danger to Others  ?Danger to Others None reported or observed  ? ? ?

## 2021-06-18 ENCOUNTER — Encounter (HOSPITAL_COMMUNITY): Payer: Self-pay

## 2021-06-18 MED ORDER — SERTRALINE HCL 100 MG PO TABS
100.0000 mg | ORAL_TABLET | Freq: Every day | ORAL | Status: DC
  Administered 2021-06-19 – 2021-06-21 (×3): 100 mg via ORAL
  Filled 2021-06-18 (×3): qty 1
  Filled 2021-06-18: qty 7
  Filled 2021-06-18 (×2): qty 1

## 2021-06-18 MED ORDER — TRAZODONE HCL 50 MG PO TABS: 50.0000 mg | ORAL_TABLET | Freq: Every evening | ORAL | Status: DC | PRN

## 2021-06-18 MED ORDER — HYDROCHLOROTHIAZIDE 12.5 MG PO TABS
12.5000 mg | ORAL_TABLET | Freq: Every day | ORAL | Status: DC
Start: 2021-06-18 — End: 2021-06-20
  Administered 2021-06-18 – 2021-06-20 (×3): 12.5 mg via ORAL
  Filled 2021-06-18 (×5): qty 1

## 2021-06-18 MED ORDER — GABAPENTIN 100 MG PO CAPS
200.0000 mg | ORAL_CAPSULE | Freq: Every day | ORAL | Status: DC
Start: 2021-06-18 — End: 2021-06-21
  Administered 2021-06-18 – 2021-06-20 (×3): 200 mg via ORAL
  Filled 2021-06-18 (×6): qty 2

## 2021-06-18 MED ORDER — AMLODIPINE BESYLATE 5 MG PO TABS
5.0000 mg | ORAL_TABLET | Freq: Every day | ORAL | Status: DC
  Administered 2021-06-18 – 2021-06-21 (×4): 5 mg via ORAL
  Filled 2021-06-18 (×3): qty 1
  Filled 2021-06-18: qty 7
  Filled 2021-06-18 (×3): qty 1

## 2021-06-18 MED ORDER — GABAPENTIN 100 MG PO CAPS
100.0000 mg | ORAL_CAPSULE | Freq: Two times a day (BID) | ORAL | Status: DC
  Administered 2021-06-18 – 2021-06-21 (×6): 100 mg via ORAL
  Filled 2021-06-18 (×3): qty 1
  Filled 2021-06-18: qty 28
  Filled 2021-06-18 (×5): qty 1
  Filled 2021-06-18: qty 28

## 2021-06-18 NOTE — Group Note (Signed)
[place] LCSW Group Therapy Note ? ? ?Group Date: 06/18/2021 ?Start Time: 1100 ?End Time: 1200 ? ? ?Type of Therapy/Topic:  Group Therapy:  Emotion Regulation ? ?Participation Level:  Minimal  ? ? ? ?Description of Group:   ? The purpose of this group is to assist patients in learning to regulate negative emotions and experience positive emotions. Patients will be guided to discuss ways in which they have been vulnerable to their negative emotions. These vulnerabilities will be juxtaposed with experiences of positive emotions or situations, and patients challenged to use positive emotions to combat negative ones. Special emphasis will be placed on coping with negative emotions in conflict situations, and patients will process healthy conflict resolution skills. ? ?Therapeutic Goals: ?Patient will identify two positive emotions or experiences to reflect on in order to balance out negative emotions:  ?Patient will label two or more emotions that they find the most difficult to experience:  ?Patient will be able to demonstrate positive conflict resolution skills through discussion or role plays:  ? ?Summary of Patient Progress: Patient attended and participated in group. Pt was able to identify different emotions and learn about healthy ways to cope and regulate these emotions.  ? ? ? ?Therapeutic Modalities:   ?Cognitive Behavioral Therapy ?Feelings Identification ?Dialectical Behavioral Therapy ? ? ?Vassie Moselle, LCSW ?

## 2021-06-18 NOTE — [facility] Suicide Risk Assessment (Signed)
[facility] INPATIENT:  Family/Significant Other Suicide Prevention Education ? ?Suicide Prevention Education:  ?Education Completed; Girlfriend Carollee Herter [phone]), has been identified by the patient as the family member/significant other with whom the patient will be residing, and identified as the person(s) who will aid the patient in the event of a mental health crisis (suicidal ideations/suicide attempt).  With written consent from the patient, the family member/significant other has been provided the following suicide prevention education, prior to the and/or following the discharge of the patient. ? ?She states that the day he came to the hospital he was making statement of wanting to die. She states his alcohol use is his main issue. When he is not consuming alcohol he is a completely different person.  ? ?The suicide prevention education provided includes the following: ?Suicide risk factors ?Suicide prevention and interventions ?National Suicide Hotline telephone number ?[facility] assessment telephone number ?[facility] Emergency Assistance 911 ?[place] and/or Residential Mobile Crisis Unit telephone number ? ?Request made of family/significant other to: ?Remove weapons (e.g., guns, rifles, knives), all items previously/currently identified as safety concern.   ?Remove drugs/medications (over-the-counter, prescriptions, illicit drugs), all items previously/currently identified as a safety concern. ? ?The family member/significant other verbalizes understanding of the suicide prevention education information provided.  The family member/significant other agrees to remove the items of safety concern listed above. ? ?Cortny Bambach A Melvyn Hommes ?06/18/2021, 10:00 AM ?

## 2021-06-18 NOTE — [facility] Counselor (Signed)
CSW spoke with [facility] DA's office who shared that this patients warrant was withdrawn and his new court hearing is scheduled for 08/17/2019.  ? ?CSW informed patient and bondsman of this update. ? ? ? ?Darletta Moll MSW, LCSW ?Clincal Social Worker  ?[facility]  ?

## 2021-06-18 NOTE — Progress Notes (Incomplete)
Patient has been in his room isolative.  Patient currently denies SI, HI and AVH.   ?

## 2021-06-18 NOTE — Progress Notes (Signed)
[facility] MD Progress Note ? ?06/18/2021 3:10 PM ?Paul Lambert  ?MRN:  [id number] ?Subjective:  Patient is a 46 year old male with past psychiatric history of alcohol dependence, cocaine use, seizures with medical history of HTN initially presented to [facility] ED due to [facility] with a plan to cut his wrist or shoot himself with a gun. ?  ?Patient was seen today for follow-up. Patient reports that his mood is "all right".  He reports some improvement in his mood and anxiety.  Currently, he denies active or passive SI, HI.  He is still reporting auditory hallucinations and heard somebody calling his name last night.  He reports visual hallucinations of seeing dots or somebody walking behind him. He reports that he did not sleep well last night and was not able to fall asleep.  Although charting shows patient slept 7.5 hours last night.  He reports stable appetite.  He reports some nausea but denies any headache, dizziness, chest pain, SOB, vomiting, abdominal pain, diarrhea, and constipation.  He reports that his swelling on the face is completely gone.  Discussed that we will increase Zoloft to 100 mg today and add trazodone for insomnia.  He verbalizes understanding and agrees with the plan. He reports some cravings.  Discussed if he would be interested in starting gabapentin or naltrexone .  He agrees to start any of the 2 meds discussed.  he reports that the CSW is going to call some rehab places for him.  Discussed that CSW is also trying to get his warrant withdrawn due to missed court hearing and will try to reschedule his hearing.  He verbalizes understanding. He denies any other concerns. ? ?Principal Problem: MDD (major depressive disorder), recurrent, severe, with psychosis (HCC) ?Diagnosis: Principal Problem: ?  MDD (major depressive disorder), recurrent, severe, with psychosis (HCC) ?Active Problems: ?  Hypertension ?  Seizures (HCC) ?  Alcohol dependence (HCC) ?  Cocaine use ?  Severe recurrent major depression  with psychotic features (HCC) ?  Nicotine dependence ?  Angioedema of lips ? ?Total Time spent with patient: 30 minutes ? ?Past Psychiatric History: see H&P ? ?Past Medical History:  ?Past Medical History:  ?Diagnosis Date  ? ETOH abuse   ? Hypertension   ? Seizures (HCC)   ?  ?Past Surgical History:  ?Procedure Laterality Date  ? FACIAL COSMETIC SURGERY    ? ?Family History:  ?Family History  ?Problem Relation Age of Onset  ? Hypertension Mother   ? Hypertension Father   ? ?Family Psychiatric  History: see H&P ?Social History:  ?Social History  ? ?Substance and Sexual Activity  ?Alcohol Use Yes  ? Alcohol/week: 40.0 - 52.0 standard drinks  ? Types: 24 - 36 Cans of beer, 16 Shots of liquor per week  ? Comment: last drink yesterday  ?   ?Social History  ? ?Substance and Sexual Activity  ?Drug Use Not Currently  ? Frequency: 7.0 times per week  ? Types: Marijuana, "Crack" cocaine  ? Comment: "I last used on 06/13/21 before I went to the hospital"  ?  ?Social History  ? ?Socioeconomic History  ? Marital status: Single  ?  Spouse name: Not on file  ? Number of children: Not on file  ? Years of education: Not on file  ? Highest education level: Not on file  ?Occupational History  ? Not on file  ?Tobacco Use  ? Smoking status: Every Day  ?  Packs/day: 0.50  ?  Types: Cigarettes  ?  Smokeless tobacco: Never  ?Vaping Use  ? Vaping Use: Never used  ?Substance and Sexual Activity  ? Alcohol use: Yes  ?  Alcohol/week: 40.0 - 52.0 standard drinks  ?  Types: 24 - 36 Cans of beer, 16 Shots of liquor per week  ?  Comment: last drink yesterday  ? Drug use: Not Currently  ?  Frequency: 7.0 times per week  ?  Types: Marijuana, "Crack" cocaine  ?  Comment: "I last used on 06/13/21 before I went to the hospital"  ? Sexual activity: Not on file  ?Other Topics Concern  ? Not on file  ?Social History Narrative  ? Pt is a 46 y/o [place] male with history of polysubstance abuse (cocaine, etoh & marijuana). Currently homeless and without  a job.  ? ?Social Determinants of Health  ? ?Financial Resource Strain: Not on file  ?Food Insecurity: Not on file  ?Transportation Needs: Not on file  ?Physical Activity: Not on file  ?Stress: Not on file  ?Social Connections: Not on file  ? ?Additional Social History:  ?  ?  ?  ?  ?  ?  ?  ?  ?  ?  ?  ? ?Sleep: Good Per Pt he slept poorly. Charting shows he slept 7.5 hours last night ? ?Appetite:  Good ? ?Current Medications: ?Current Facility-Administered Medications  ?Medication Dose Route Frequency Provider Last Rate Last Admin  ? acetaminophen (TYLENOL) tablet 650 mg  650 mg Oral Q6H PRN Cecilie Lowers, FNP   650 mg at 06/15/21 1540  ? alum & mag hydroxide-simeth (MAALOX/MYLANTA) 200-200-20 MG/5ML suspension 30 mL  30 mL Oral Q4H PRN Ntuen, Jesusita Oka, FNP      ? amLODipine (NORVASC) tablet 5 mg  5 mg Oral Daily Caydee Talkington, MD      ? famotidine (PEPCID) tablet 20 mg  20 mg Oral BID Nira Conn A, NP   20 mg at 06/18/21 0831  ? gabapentin (NEURONTIN) capsule 100 mg  100 mg Oral BID Karsten Ro, MD      ? gabapentin (NEURONTIN) capsule 200 mg  200 mg Oral QHS Modine Oppenheimer, MD      ? hydrochlorothiazide (HYDRODIURIL) tablet 12.5 mg  12.5 mg Oral Daily Samai Corea, MD      ? loperamide (IMODIUM) capsule 2-4 mg  2-4 mg Oral PRN Comer Locket, MD      ? loratadine (CLARITIN) tablet 10 mg  10 mg Oral Daily Tanda Rockers A, DO   10 mg at 06/18/21 1354  ? LORazepam (ATIVAN) tablet 1 mg  1 mg Oral Q6H PRN Mason Jim, Amy E, MD      ? magnesium hydroxide (MILK OF MAGNESIA) suspension 30 mL  30 mL Oral Daily PRN Ntuen, Jesusita Oka, FNP      ? methocarbamol (ROBAXIN) tablet 500 mg  500 mg Oral Q8H PRN Comer Locket, MD      ? multivitamin with minerals tablet 1 tablet  1 tablet Oral Daily Mason Jim, Amy E, MD   1 tablet at 06/18/21 0830  ? naproxen (NAPROSYN) tablet 500 mg  500 mg Oral BID PRN Comer Locket, MD      ? nicotine (NICODERM CQ - dosed in mg/24 hours) patch 14 mg  14 mg Transdermal Daily Nira Conn A,  NP   14 mg at 06/18/21 0831  ? pantoprazole (PROTONIX) EC tablet 40 mg  40 mg Oral Daily Nira Conn A, NP   40 mg at 06/18/21 0830  ?  pneumococcal 20-valent conjugate vaccine (PREVNAR 20) injection 0.5 mL  0.5 mL Intramuscular Tomorrow-1000 Mason JimSingleton, Amy E, MD      ? Melene Muller[START ON 06/19/2021] sertraline (ZOLOFT) tablet 100 mg  100 mg Oral Daily Sherlynn Tourville, MD      ? thiamine tablet 100 mg  100 mg Oral Daily Mason JimSingleton, Amy E, MD   100 mg at 06/18/21 0831  ? ? ?Lab Results:  ?Results for orders placed or performed during the hospital encounter of 06/15/21 (from the past 48 hour(s))  ?Lipid panel     Status: Abnormal  ? Collection Time: 06/16/21  6:41 PM  ?Result Value Ref Range  ? Cholesterol 194 0 - 200 mg/dL  ? Triglycerides 344 (H) <150 mg/dL  ? HDL 72 >40 mg/dL  ? Total CHOL/HDL Ratio 2.7 RATIO  ? VLDL 69 (H) 0 - 40 mg/dL  ? LDL Cholesterol 53 0 - 99 mg/dL  ?  Comment:        ?Total Cholesterol/HDL:CHD Risk ?Coronary Heart Disease Risk Table ?                    Men   Women ? 1/2 Average Risk   3.4   3.3 ? Average Risk       5.0   4.4 ? 2 X Average Risk   9.6   7.1 ? 3 X Average Risk  23.4   11.0 ?       ?Use the calculated Patient Ratio ?above and the CHD Risk Table ?to determine the patient's CHD Risk. ?       ?ATP III CLASSIFICATION (LDL): ? <100     mg/dL   Optimal ? 161-096100-129  mg/dL   Near or Above ?                   Optimal ? 130-159  mg/dL   Borderline ? 160-189  mg/dL   High ? >045>190     mg/dL   Very High ?Performed at [facility], [address] [place], [place] [address] ?  ?TSH     Status: None  ? Collection Time: 06/16/21  6:41 PM  ?Result Value Ref Range  ? TSH 1.584 0.350 - 4.500 uIU/mL  ?  Comment: Performed by a 3rd Generation assay with a functional sensitivity of <=0.01 uIU/mL. ?Performed at [facility], [address] [place], [place] [address] ?  ?Hemoglobin A1c     Status: None  ? Collection Time: 06/16/21  6:41 PM  ?Result Value Ref Range  ? Hgb A1c MFr  Bld 5.6 4.8 - 5.6 %  ?  Comment: (NOTE) ?Pre diabetes:          5.7%-6.4% ? ?Diabetes:              >6.4% ? ?Glycemic control for   <7.0% ?adults with diabetes ?  ? Mean Plasma Glucose 114.02 mg/dL  ?

## 2021-06-18 NOTE — Progress Notes (Signed)
Chaplain received a consult to see Paul Lambert and provide spiritual support.  Paul Lambert was in good spirits and was grateful for the visit.  He requested a Bible, which chaplain provided, but stated that he did not have any other concerns. ? ?[employer], Bcc ?Pager, [phone] ? ?

## 2021-06-18 NOTE — Progress Notes (Signed)
Adult Psychoeducational Group Note ? ?Date:  06/18/2021 ?Time:  8:13 PM ? ?Group Topic/Focus:  ?Wrap-Up Group:   The focus of this group is to help patients review their daily goal of treatment and discuss progress on daily workbooks. ? ?Participation Level:  Active ? ?Participation Quality:  Appropriate and Attentive ? ?Affect:  Appropriate ? ?Cognitive:  Alert and Appropriate ? ?Insight: Appropriate ? ?Engagement in Group:  Engaged ? ?Modes of Intervention:  Discussion ? ?Additional Comments:   ?Pt was engaged during group discussion. Pt stated that he had a good day and shared that he got some good news about his rehab placement and is excited about his phone interview for it on Monday. Pt rated his day at a 10/10 and stated that his girlfriend is supposed to come visit him at some point in the next few days. Pt denies AVH, SI, HI, anxiety, and depression.  ? ?Vevelyn Pat ?06/18/2021, 8:13 PM ?

## 2021-06-18 NOTE — BH IP Treatment Plan (Signed)
Interdisciplinary Treatment and Diagnostic Plan Update ? ?06/18/2021 ?Time of Session: 10:40am ?FAMOUS SPELLER ?MRN: [id number] ? ?Principal Diagnosis: MDD (major depressive disorder), recurrent, severe, with psychosis ([place]) ? ?Secondary Diagnoses: Principal Problem: ?  MDD (major depressive disorder), recurrent, severe, with psychosis ([place]) ?Active Problems: ?  Hypertension ?  Seizures ([place]) ?  Alcohol dependence ([place]) ?  Cocaine use ?  Severe recurrent major depression with psychotic features ([place]) ?  Nicotine dependence ?  Angioedema of lips ? ? ?Current Medications:  ?Current Facility-Administered Medications  ?Medication Dose Route Frequency Provider Last Rate Last Admin  ? acetaminophen (TYLENOL) tablet 650 mg  650 mg Oral Q6H PRN Laretta Bolster, FNP   650 mg at 06/15/21 1540  ? alum & mag hydroxide-simeth (MAALOX/MYLANTA) 200-200-20 MG/5ML suspension 30 mL  30 mL Oral Q4H PRN Ntuen, Kris Hartmann, FNP      ? famotidine (PEPCID) tablet 20 mg  20 mg Oral BID Lindon Romp A, NP   20 mg at 06/18/21 0831  ? loperamide (IMODIUM) capsule 2-4 mg  2-4 mg Oral PRN Harlow Asa, MD      ? loratadine (CLARITIN) tablet 10 mg  10 mg Oral Daily Wynona Dove A, DO   10 mg at 06/18/21 1354  ? LORazepam (ATIVAN) tablet 1 mg  1 mg Oral Q6H PRN Nelda Marseille, Amy E, MD      ? magnesium hydroxide (MILK OF MAGNESIA) suspension 30 mL  30 mL Oral Daily PRN Ntuen, Kris Hartmann, FNP      ? methocarbamol (ROBAXIN) tablet 500 mg  500 mg Oral Q8H PRN Harlow Asa, MD      ? multivitamin with minerals tablet 1 tablet  1 tablet Oral Daily Nelda Marseille, Amy E, MD   1 tablet at 06/18/21 0830  ? naproxen (NAPROSYN) tablet 500 mg  500 mg Oral BID PRN Harlow Asa, MD      ? nicotine (NICODERM CQ - dosed in mg/24 hours) patch 14 mg  14 mg Transdermal Daily Lindon Romp A, NP   14 mg at 06/18/21 0831  ? pantoprazole (PROTONIX) EC tablet 40 mg  40 mg Oral Daily Lindon Romp A, NP   40 mg at 06/18/21 0830  ? pneumococcal 20-valent conjugate vaccine (PREVNAR  20) injection 0.5 mL  0.5 mL Intramuscular Tomorrow-1000 Nelda Marseille, Amy E, MD      ? Derrill Memo ON 06/19/2021] sertraline (ZOLOFT) tablet 100 mg  100 mg Oral Daily Doda, Vandana, MD      ? thiamine tablet 100 mg  100 mg Oral Daily Nelda Marseille, Amy E, MD   100 mg at 06/18/21 0831  ? traZODone (DESYREL) tablet 50 mg  50 mg Oral QHS PRN Armando Reichert, MD      ? ?PTA Medications: ?Medications Prior to Admission  ?Medication Sig Dispense Refill Last Dose  ? famotidine (PEPCID) 20 MG tablet Take 1 tablet (20 mg total) by mouth 2 (two) times daily. (Patient not taking: Reported on 06/14/2021) 30 tablet 0   ? ibuprofen (ADVIL) 800 MG tablet Take 1 tablet (800 mg total) by mouth 3 (three) times daily. (Patient not taking: Reported on 06/14/2021) 21 tablet 0   ? omeprazole (PRILOSEC) 20 MG capsule Take 1 capsule (20 mg total) by mouth daily. (Patient not taking: Reported on 06/14/2021) 30 capsule 0   ? ondansetron (ZOFRAN-ODT) 4 MG disintegrating tablet Take 1 tablet (4 mg total) by mouth every 8 (eight) hours as needed for nausea or vomiting. (Patient not taking: Reported on 06/14/2021)  20 tablet 0   ? polyethylene glycol (MIRALAX) 17 g packet Take 17 g by mouth daily. (Patient not taking: Reported on 06/14/2021) 14 each 0   ? ? ?Patient Stressors: Financial difficulties   ?Legal issue   ?Occupational concerns   ?Substance abuse   ? ?Patient Strengths: Capable of independent living  ?Communication skills  ?Religious Affiliation  ?Supportive family/friends  ?Work skills  ? ?Treatment Modalities: Medication Management, Group therapy, Case management,  ?1 to 1 session with clinician, Psychoeducation, Recreational therapy. ? ? ?Physician Treatment Plan for Primary Diagnosis: MDD (major depressive disorder), recurrent, severe, with psychosis ([place]) ?Long Term Goal(s):    ? ?Short Term Goals:   ? ?Medication Management: Evaluate patient's response, side effects, and tolerance of medication regimen. ? ?Therapeutic Interventions: 1 to 1 sessions,  Unit Group sessions and Medication administration. ? ?Evaluation of Outcomes: Progressing ? ?Physician Treatment Plan for Secondary Diagnosis: Principal Problem: ?  MDD (major depressive disorder), recurrent, severe, with psychosis ([place]) ?Active Problems: ?  Hypertension ?  Seizures ([place]) ?  Alcohol dependence ([place]) ?  Cocaine use ?  Severe recurrent major depression with psychotic features ([place]) ?  Nicotine dependence ?  Angioedema of lips ? ?Long Term Goal(s):    ? ?Short Term Goals:      ? ?Medication Management: Evaluate patient's response, side effects, and tolerance of medication regimen. ? ?Therapeutic Interventions: 1 to 1 sessions, Unit Group sessions and Medication administration. ? ?Evaluation of Outcomes: Progressing ? ? ?RN Treatment Plan for Primary Diagnosis: MDD (major depressive disorder), recurrent, severe, with psychosis ([place]) ?Long Term Goal(s): Knowledge of disease and therapeutic regimen to maintain health will improve ? ?Short Term Goals: Ability to remain free from injury will improve, Ability to verbalize frustration and anger appropriately will improve, Ability to demonstrate self-control, Ability to participate in decision making will improve, Ability to verbalize feelings will improve, Ability to disclose and discuss suicidal ideas, Ability to identify and develop effective coping behaviors will improve, and Compliance with prescribed medications will improve ? ?Medication Management: RN will administer medications as ordered by provider, will assess and evaluate patient's response and provide education to patient for prescribed medication. RN will report any adverse and/or side effects to prescribing provider. ? ?Therapeutic Interventions: 1 on 1 counseling sessions, Psychoeducation, Medication administration, Evaluate responses to treatment, Monitor vital signs and CBGs as ordered, Perform/monitor CIWA, COWS, AIMS and Fall Risk screenings as ordered, Perform wound care treatments as  ordered. ? ?Evaluation of Outcomes: Progressing ? ? ?LCSW Treatment Plan for Primary Diagnosis: MDD (major depressive disorder), recurrent, severe, with psychosis ([place]) ?Long Term Goal(s): Safe transition to appropriate next level of care at discharge, Engage patient in therapeutic group addressing interpersonal concerns. ? ?Short Term Goals: Engage patient in aftercare planning with referrals and resources, Increase social support, Increase ability to appropriately verbalize feelings, Increase emotional regulation, Facilitate acceptance of mental health diagnosis and concerns, Facilitate patient progression through stages of change regarding substance use diagnoses and concerns, and Identify triggers associated with mental health/substance abuse issues ? ?Therapeutic Interventions: Assess for all discharge needs, 1 to 1 time with Education officer, museum, Explore available resources and support systems, Assess for adequacy in community support network, Educate family and significant other(s) on suicide prevention, Complete Psychosocial Assessment, Interpersonal group therapy. ? ?Evaluation of Outcomes: Progressing ? ? ?Progress in Treatment: ?Attending groups: Yes. ?Participating in groups: Yes. ?Taking medication as prescribed: Yes. ?Toleration medication: Yes. ?Family/Significant other contact made: Yes, individual(s) contacted:  girlfriend, Larene Beach ?Patient understands diagnosis:  Yes. ?Discussing patient identified problems/goals with staff: Yes. ?Medical problems stabilized or resolved: Yes. ?Denies suicidal/homicidal ideation: Yes. ?Issues/concerns per patient self-inventory: No. ?Other: none ? ?New problem(s) identified: No, Describe:  none  ? ?New Short Term/Long Term Goal(s): detox, medication management for mood stabilization; elimination of SI thoughts; development of comprehensive mental wellness/sobriety plan ? ? ? ? ?Patient Goals:  Patient states that he would like to get help with his depression and get off of  drugs.  ? ?Discharge Plan or Barriers: Patient recently admitted. CSW will continue to follow and assess for appropriate referrals and possible discharge planning.  ? ? ?Reason for Continuation of Hospitaliza

## 2021-06-18 NOTE — Group Note (Signed)
Recreation Therapy Group Note ? ? ?Group Topic:Leisure Education  ?Group Date: 06/18/2021 ?Start Time: 1006 ?End Time: 1050 ?Facilitators: Caroll Rancher, LRT,CTRS ?Location: [address] Day[address] ? ? ?Goal Area(s) Addresses:  ?Patient will successfully identify benefits of leisure participation. ?Patient will successfully identify ways to access leisure activities. ?  ?Group Description:  Patients and LRT went over the meaning of leisure and its benefits. Patients were then instructed to create a PSA that explains leisure to your everyday individual.  Patients were given construction paper, glue sticks, magazines, scissors and markers to create their ad.  In addition to explaining leisure and its benefits, patients were to include where leisure could happen and give examples.  Patients were debriefed on being active in leisure, and also how leisure can still provide you with lessons to help you learn. ? ? ?Affect/Mood: N/A ?  ?Participation Level: Did not attend ?  ? ?Clinical Observations/Individualized Feedback:   ? ? ?Plan: Continue to engage patient in RT group sessions 2-3x/week. ? ? ?Caroll Rancher, LRT,CTRS ?06/18/2021 1:10 PM ?

## 2021-06-18 NOTE — BHH Group Notes (Signed)
Spirituality group facilitated by Kathrynn Humble, [place].  ? ?Group Description: Group focused on topic of hope. Patients participated in facilitated discussion around topic, connecting with one another around experiences and definitions for hope. Group members engaged with visual explorer photos, reflecting on what hope looks like for them today. Group engaged in discussion around how their definitions of hope are present today in hospital.  ? ?Modalities: Psycho-social ed, Adlerian, Narrative, MI  ? ?Patient Progress: Lars attended group and was engaged in the group conversation.  Comments were on topic and appropriate and showed some insight.  ? ?[employer], Bcc ?Pager, [phone] ?

## 2021-06-18 NOTE — BHH Suicide Risk Assessment (Signed)
[facility] INPATIENT:  Family/Significant Other Suicide Prevention Education ? ?Suicide Prevention Education:  ?Contact Attempts: Girlfriend Larene Beach [phone]), has been identified by the patient as the family member/significant other with whom the patient will be residing, and identified as the person(s) who will aid the patient in the event of a mental health crisis.  With written consent from the patient, two attempts were made to provide suicide prevention education, prior to and/or following the patient's discharge.  We were unsuccessful in providing suicide prevention education.  A suicide education pamphlet was given to the patient to share with family/significant other. ? ?Date and time of first attempt:06/17/2021 / 11:42am CSW left a HIPAA compliant voicemail with patients girlfriend requesting a return call.  ?Date and time of second attempt: 06/18/2021 / 9:55am CSW left a HIPAA compliant voicemail requesting a return call. ? ? ?Unable to reach supports, SPE pamphlet placed on chart for patient to share with supports at discharge.  ? ? ?[place] ?06/18/2021, 9:57 AM ?

## 2021-06-19 DIAGNOSIS — F333 Major depressive disorder, recurrent, severe with psychotic symptoms: Secondary | ICD-10-CM | POA: Diagnosis not present

## 2021-06-19 MED ORDER — TRAZODONE HCL 50 MG PO TABS
50.0000 mg | ORAL_TABLET | Freq: Every evening | ORAL | Status: DC | PRN
  Administered 2021-06-19: 50 mg via ORAL
  Filled 2021-06-19: qty 1

## 2021-06-19 MED ORDER — ONDANSETRON HCL 4 MG PO TABS: 4.0000 mg | ORAL_TABLET | Freq: Four times a day (QID) | ORAL | Status: DC | PRN

## 2021-06-19 NOTE — Progress Notes (Signed)
Pt A&OX4, cooperative, calm, and walking in the hallway periodically. Pt denies ideations and hallucinations. Pt states he did not sleep well. Pt request sleep aid. Writer notified Mental Health Provider of Pt's request.  ?

## 2021-06-19 NOTE — Progress Notes (Signed)
The focus of this group is to help patients review their daily goal of treatment and discuss progress on daily workbooks. ? ?Pt attended the evening group and responded to all discussion prompts from the [place]. Pt shared that today was a good day on the unit, the highlight of which was waking up sober and "being grateful to God for that." ? ?Pt told that his goal for the coming week was to transition to another facility to continue his treatment, which he hoped to find more details about Monday. "I've never been to this place before, but I feel good about it. I'm ready to get better." ? ?Pt rated his day a 10 out of 10 and his affect was appropriate. ?

## 2021-06-19 NOTE — Progress Notes (Addendum)
[facility] MD Progress Note ? ?06/19/2021 3:03 PM ?Paul Lambert  ?MRN:  [id number] ? ?Subjective: Paul Lambert reports: "I am doing alright, but I didn't sleep good last nigh. I was tossing and turning." ? ?Reason For Admission:  Patient is a 46 year old male with past psychiatric history of alcohol dependence, cocaine use, seizures with medical history of HTN initially presented to [facility] ED due to [facility] with a plan to cut his wrist or shoot himself with a gun. ?  ?Today's patient assessment: Pt presents today with a euthymic mood, and affect is congruent and appropriate.  His attention to personal hygiene and grooming is fair, eye contact is good, speech is clear & coherent. Thought contents are organized and logical, and pt currently denies SI/HI/AVH or paranoia. There is no evidence of delusional thoughts.   ? ?Pt reports today that he is doing well overall, but states that his sleep quality last night was poor because he was "tossing and turning". As per flow sheets, pt slept a total of 7.25 hrs last night. Will however, add Trazodone 50 mg nightly PRN to pt's medication regimen for management of his insomnia. Pt reports a good appetite, but reports feeling slightly nauseous. Will add Zofran as needed for nausea, and will continue other medications as listed below. ? ?Principal Problem: MDD (major depressive disorder), recurrent, severe, with psychosis ([facility]) ?Diagnosis: Principal Problem: ?  MDD (major depressive disorder), recurrent, severe, with psychosis ([facility]) ?Active Problems: ?  Hypertension ?  Seizures ([facility]) ?  Alcohol dependence ([facility]) ?  Cocaine use ?  Severe recurrent major depression with psychotic features ([facility]) ?  Nicotine dependence ?  Angioedema of lips ? ?Total Time spent with patient: 30 minutes ? ?Past Psychiatric History: see H&P ? ?Past Medical History:  ?Past Medical History:  ?Diagnosis Date  ? ETOH abuse   ? Hypertension   ? Seizures ([facility])   ?  ?Past Surgical History:  ?Procedure Laterality Date  ?  FACIAL COSMETIC SURGERY    ? ?Family History:  ?Family History  ?Problem Relation Age of Onset  ? Hypertension Mother   ? Hypertension Father   ? ?Family Psychiatric  History: see H&P ?Social History:  ?Social History  ? ?Substance and Sexual Activity  ?Alcohol Use Yes  ? Alcohol/week: 40.0 - 52.0 standard drinks  ? Types: 24 - 36 Cans of beer, 16 Shots of liquor per week  ? Comment: last drink yesterday  ?   ?Social History  ? ?Substance and Sexual Activity  ?Drug Use Not Currently  ? Frequency: 7.0 times per week  ? Types: Marijuana, "Crack" cocaine  ? Comment: "I last used on 06/13/21 before I went to the hospital"  ?  ?Social History  ? ?Socioeconomic History  ? Marital status: Single  ?  Spouse name: Not on file  ? Number of children: Not on file  ? Years of education: Not on file  ? Highest education level: Not on file  ?Occupational History  ? Not on file  ?Tobacco Use  ? Smoking status: Every Day  ?  Packs/day: 0.50  ?  Types: Cigarettes  ? Smokeless tobacco: Never  ?Vaping Use  ? Vaping Use: Never used  ?Substance and Sexual Activity  ? Alcohol use: Yes  ?  Alcohol/week: 40.0 - 52.0 standard drinks  ?  Types: 24 - 36 Cans of beer, 16 Shots of liquor per week  ?  Comment: last drink yesterday  ? Drug use: Not Currently  ?  Frequency:  7.0 times per week  ?  Types: Marijuana, "Crack" cocaine  ?  Comment: "I last used on 06/13/21 before I went to the hospital"  ? Sexual activity: Not on file  ?Other Topics Concern  ? Not on file  ?Social History Narrative  ? Pt is a 46 y/o [place] American male with history of polysubstance abuse (cocaine, etoh & marijuana). Currently homeless and without a job.  ? ?Social Determinants of Health  ? ?Financial Resource Strain: Not on file  ?Food Insecurity: Not on file  ?Transportation Needs: Not on file  ?Physical Activity: Not on file  ?Stress: Not on file  ?Social Connections: Not on file  ? ?Additional Social History:  ?  ? Sleep: Good Per Pt he slept poorly. Charting shows he  slept 7.5 hours last night ? ?Appetite:  Good ? ?Current Medications: ?Current Facility-Administered Medications  ?Medication Dose Route Frequency Provider Last Rate Last Admin  ? acetaminophen (TYLENOL) tablet 650 mg  650 mg Oral Q6H PRN Cecilie Lowers, FNP   650 mg at 06/15/21 1540  ? alum & mag hydroxide-simeth (MAALOX/MYLANTA) 200-200-20 MG/5ML suspension 30 mL  30 mL Oral Q4H PRN Ntuen, Jesusita Oka, FNP      ? amLODipine (NORVASC) tablet 5 mg  5 mg Oral Daily Karsten Ro, MD   5 mg at 06/19/21 0737  ? famotidine (PEPCID) tablet 20 mg  20 mg Oral BID Nira Conn A, NP   20 mg at 06/19/21 0735  ? gabapentin (NEURONTIN) capsule 100 mg  100 mg Oral BID Karsten Ro, MD   100 mg at 06/19/21 0736  ? gabapentin (NEURONTIN) capsule 200 mg  200 mg Oral QHS Karsten Ro, MD   200 mg at 06/18/21 2108  ? hydrochlorothiazide (HYDRODIURIL) tablet 12.5 mg  12.5 mg Oral Daily Karsten Ro, MD   12.5 mg at 06/19/21 0740  ? loperamide (IMODIUM) capsule 2-4 mg  2-4 mg Oral PRN Comer Locket, MD      ? loratadine (CLARITIN) tablet 10 mg  10 mg Oral Daily Tanda Rockers A, DO   10 mg at 06/19/21 1126  ? magnesium hydroxide (MILK OF MAGNESIA) suspension 30 mL  30 mL Oral Daily PRN Ntuen, Jesusita Oka, FNP      ? methocarbamol (ROBAXIN) tablet 500 mg  500 mg Oral Q8H PRN Comer Locket, MD      ? multivitamin with minerals tablet 1 tablet  1 tablet Oral Daily Mason Jim, Jadier Rockers E, MD   1 tablet at 06/19/21 5956  ? naproxen (NAPROSYN) tablet 500 mg  500 mg Oral BID PRN Comer Locket, MD      ? nicotine (NICODERM CQ - dosed in mg/24 hours) patch 14 mg  14 mg Transdermal Daily Nira Conn A, NP   14 mg at 06/19/21 0736  ? ondansetron (ZOFRAN) tablet 4 mg  4 mg Oral Q6H PRN Nkwenti, Doris, NP      ? pantoprazole (PROTONIX) EC tablet 40 mg  40 mg Oral Daily Nira Conn A, NP   40 mg at 06/19/21 0732  ? pneumococcal 20-valent conjugate vaccine (PREVNAR 20) injection 0.5 mL  0.5 mL Intramuscular Tomorrow-1000 Mason Jim, Dwayna Kentner E, MD      ?  sertraline (ZOLOFT) tablet 100 mg  100 mg Oral Daily Karsten Ro, MD   100 mg at 06/19/21 0732  ? thiamine tablet 100 mg  100 mg Oral Daily Bartholomew Crews E, MD   100 mg at 06/19/21 0736  ? traZODone (DESYREL) tablet  50 mg  50 mg Oral QHS PRN Starleen BlueNkwenti, Doris, NP      ? ?Lab Results:  ?No results found for this or any previous visit (from the past 48 hour(s)). ? ?Blood Alcohol level:  ?Lab Results  ?Component Value Date  ? ETH 185 (H) 06/14/2021  ? ETH 288 (H) 09/12/2020  ? ?Metabolic Disorder Labs: ?Lab Results  ?Component Value Date  ? HGBA1C 5.6 06/16/2021  ? MPG 114.02 06/16/2021  ? ?No results found for: PROLACTIN ?Lab Results  ?Component Value Date  ? CHOL 194 06/16/2021  ? TRIG 344 (H) 06/16/2021  ? HDL 72 06/16/2021  ? CHOLHDL 2.7 06/16/2021  ? VLDL 69 (H) 06/16/2021  ? LDLCALC 53 06/16/2021  ? ?Physical Findings: ?AIMS: Facial and Oral Movements ?Muscles of Facial Expression: None, normal ?Lips and Perioral Area: None, normal ?Jaw: None, normal ?Tongue: None, normal,Extremity Movements ?Upper (arms, wrists, hands, fingers): None, normal ?Lower (legs, knees, ankles, toes): None, normal, Trunk Movements ?Neck, shoulders, hips: None, normal, Overall Severity ?Severity of abnormal movements (highest score from questions above): None, normal ?Incapacitation due to abnormal movements: None, normal ?Patient's awareness of abnormal movements (rate only patient's report): No Awareness, Dental Status ?Current problems with teeth and/or dentures?: No ?Does patient usually wear dentures?: No  ?CIWA:  CIWA-Ar Total: 4 ?COWS:  COWS Total Score: 0 ? ?Musculoskeletal: ?Strength & Muscle Tone: within normal limits ?Gait & Station: normal ?Patient leans: N/A ? ?Psychiatric Specialty Exam: ? ?Presentation  ?General Appearance: Appropriate for Environment; Fairly Groomed ? ?Eye Contact:Good ? ?Speech:Clear and Coherent ? ?Speech Volume:Normal ? ?Handedness:Right ? ?Mood and Affect  ?Mood:Euthymic ? ?Affect:Appropriate;  Congruent ? ?Thought Process  ?Thought Processes:Coherent ? ?Descriptions of Associations:Intact ? ?Orientation:Full (Time, Place and Person) ? ?Thought Content:Logical ? ?History of Schizophrenia/Schizoaffective disor

## 2021-06-19 NOTE — Progress Notes (Signed)
Pt sleep without difficulty throughout the night. Pt snoring majority of the time he was sleeping. No PRN requested for insomnia.  ? ? 06/19/21 0534  ?Sleep  ?Number of Hours 7.25  ? ? ?

## 2021-06-19 NOTE — [facility] Group Notes (Signed)
Psychoeducational Group Note ? ?Date: 06/19/2021 ?Time: 0900-1000 ? ? ? ?Goal Setting  ? ?Purpose of Group: This group helps to provide patients with the steps of setting a goal that is specific, measurable, attainable, realistic and time specific. A discussion on how we keep ourselves stuck with negative self talk. Homework given for Patients to write 30 positive attributes about themselves. ? ? ? ?Participation Level:  Active ? ?Participation Quality:  Appropriate ? ?Affect:  Appropriate ? ?Cognitive:  Appropriate ? ?Insight:  Improving ? ?Engagement in Group:  Engaged ? ?Additional Comments:  Pt states that he is here for alcohol and had been having trouble with detox. Rates his energy at a 10. Participated fully in the group. ? ?Vira Blanco A ?

## 2021-06-19 NOTE — Group Note (Signed)
?  [facility] LCSW Group Therapy Note ? ?Date/Time:  06/19/2021 11:15AM-12:00PM ? ?Type of Therapy and Topic:  Group Therapy:  Feelings About Hospitalization ? ?Participation Level:  Active  ? ?Description of Group ?This process group involved patients discussing their feelings related to being hospitalized, as well as the benefits they see to being in the hospital.  These feelings and benefits were itemized.  The group then brainstormed specific ways in which they could seek those same benefits when they discharge and return home. ? ?Therapeutic Goals ?Patient will identify and describe positive and negative feelings related to hospitalization ?Patient will verbalize benefits of hospitalization to themselves personally ?Patients will brainstorm together ways they can obtain similar benefits in the outpatient setting, identify barriers to wellness and possible solutions ? ?Summary of Patient Progress:  The patient expressed his primary feelings about being hospitalized are "fine" because he is here for helping with his drinking problem.  This is getting him "off the streets" while they are looking for a place for me to go where I can stay longer and get used to not having substances.  He did not understand his discharge plan in terms of when/where he is supposed to call on Monday, but this was clarified for him. ? ?Therapeutic Modalities ?Cognitive Behavioral Therapy ?Motivational Interviewing ? ? ? ?Ambrose Mantle, LCSW ?06/19/2021, 3:03 PM ?   ?

## 2021-06-19 NOTE — Progress Notes (Signed)

## 2021-06-20 DIAGNOSIS — F333 Major depressive disorder, recurrent, severe with psychotic symptoms: Secondary | ICD-10-CM | POA: Diagnosis not present

## 2021-06-20 MED ORDER — HYDROCHLOROTHIAZIDE 12.5 MG PO TABS
12.5000 mg | ORAL_TABLET | Freq: Every day | ORAL | Status: DC
  Administered 2021-06-21: 12.5 mg via ORAL
  Filled 2021-06-20 (×4): qty 1

## 2021-06-20 MED ORDER — TRAZODONE HCL 100 MG PO TABS
100.0000 mg | ORAL_TABLET | Freq: Every evening | ORAL | Status: DC | PRN
  Administered 2021-06-20: 100 mg via ORAL
  Filled 2021-06-20: qty 7

## 2021-06-20 MED ORDER — HYDROCHLOROTHIAZIDE 12.5 MG PO TABS: 12.5000 mg | ORAL_TABLET | Freq: Once | ORAL | Status: DC

## 2021-06-20 MED ORDER — HYDROCHLOROTHIAZIDE 25 MG PO TABS: 25.0000 mg | ORAL_TABLET | Freq: Every day | ORAL | Status: DC

## 2021-06-20 NOTE — Group Note (Signed)
[facility] LCSW Group Therapy Note ? ?06/20/2021  10:00-11:00AM ? ?Type of Therapy and Topic:  Group Therapy:  Acknowledging and Resolving Issues with Mothers ? ?Participation Level:  Active  ? ?Description of Group:   Patients in this group were asked to briefly describe their experience with the mother figure(s) in their lives, both in childhood and adulthood.  Different types of support provided by these individuals were identified.   Patients were then encouraged to determine whether their mother figure was or is a healthy or unhealthy support.  The manner in which that early relationship has shaped patient's feelings and life decisions was pointed out and acknowledged.  Group members gave support to each other.  CSW led a discussion on how helpful it can be to resolve past issues, and how this can be done whether the mother figure is now alive or already deceased.  An emphasis was placed on continuing to work with a therapist on these issues  when patients leave the hospital in order to be able to focus on the future instead of the past, to continue becoming healthier and happier.  ? ?Therapeutic Goals: ?1)  discuss the possibility of mother figure(s) being positive and/or negative in one's life, normalizing that some people never had positive experiences with "maternal" persons ? 2)  describe patient's specific example of mother figure(s), allowing time to vent ? 3)  identify the patient's current need for resolution in the relationship with the aforementioned person ? 4)  elicit commitments to work on resolving feelings about mother figure(s) in order to move forward in life and wellness ?  ?Summary of Patient Progress:  The patient expressed full comprehension of the concepts presented, and said his mother was a great provider financially and emotionally.  There were some ups and downs in the relationship, but the relationship with father was "always good." ?  ?Therapeutic Modalities:   ?Processing ?Brief  Solution-Focused Therapy ? ?Lynnell Chad  ? ?   ?

## 2021-06-20 NOTE — [facility] Group Notes (Signed)
Adult Psychoeducational Group Not ?Date:  06/20/2021 ?Time:  9024-0973 ?Group Topic/Focus: PROGRESSIVE RELAXATION. A group where deep breathing is taught and tensing and relaxation muscle groups is used. Imagery is used as well.  Pts are asked to imagine 3 pillars that hold them up when they are not able to hold themselves up and to share that with the group. ? ?Participation Level:  Active ? ?Participation Quality:  Appropriate ? ?Affect:  Appropriate ? ?Cognitive:  Oriented ? ?Insight: Improving ? ?Engagement in Group:  Engaged ? ?Modes of Intervention:  Activity, Discussion, Education, and Support ? ?Additional Comments:  there were 3 patients in the group. They were not able to maintain their concentration and we started talking about Anger  ? ?Paul Lambert A ? ? ?

## 2021-06-20 NOTE — Progress Notes (Signed)
?   06/19/21 2045  ?Psych Admission Type (Psych Patients Only)  ?Admission Status Voluntary  ?Psychosocial Assessment  ?Patient Complaints None  ?Eye Contact Fair  ?Facial Expression Flat  ?Affect Flat  ?Speech Logical/coherent  ?Interaction Guarded  ?Motor Activity Other (Comment) ?(WDL)  ?Appearance/Hygiene Unremarkable  ?Behavior Characteristics Cooperative;Appropriate to situation  ?Mood Pleasant  ?Thought Process  ?Coherency WDL  ?Content WDL  ?Delusions None reported or observed  ?Perception Hallucinations  ?Hallucination Visual;Auditory  ?Judgment Poor  ?Confusion None  ?Danger to Self  ?Current suicidal ideation? Denies  ?Danger to Others  ?Danger to Others None reported or observed  ? ? ?

## 2021-06-20 NOTE — Progress Notes (Signed)
Pt was encouraged but didn't attend orientation/goals group. ?

## 2021-06-20 NOTE — Progress Notes (Addendum)
[facility] MD Progress Note ? ?06/20/2021 12:11 PM ?Paul Lambert  ?MRN:  [id number] ? ?Subjective: Paul Lambert reports: "My sleep last night was better than what it has been. I am okay. Just a little nausea" ? ?Reason For Admission:  Patient is a 46 year old male with past psychiatric history of alcohol dependence, cocaine use, seizures with medical history of HTN initially presented to [facility], ED due to [facility] with a plan to cut his wrist or shoot himself with a gun. ?  ?Today's patient assessment: Pt continues to present today with a euthymic mood, and affect is congruent and appropriate.  His attention to personal hygiene and grooming is fair, eye contact is good, speech is clear & coherent. Thought contents are organized and logical, and pt currently denies SI/HI/AVH or paranoia. There is no evidence of delusional thoughts.   ? ?Pt reports a better sleep quality last night than the night prior. However, as per the nursing flow sheets, he only slept a total of 3.5 hrs last night. Nightly dose of Trazodone is being increased to 100 mg nightly for insomnia. He reports a good appetite, and denies being in any physical pain. He denies any medication related side effects. Will continue medications as listed below. ? ?Principal Problem: MDD (major depressive disorder), recurrent, severe, with psychosis ([place]) ?Diagnosis: Principal Problem: ?  MDD (major depressive disorder), recurrent, severe, with psychosis ([place]) ?Active Problems: ?  Hypertension ?  Seizures ([place]) ?  Alcohol dependence ([place]) ?  Cocaine use ?  Severe recurrent major depression with psychotic features ([place]) ?  Nicotine dependence ?  Angioedema of lips ? ?Total Time spent with patient: 30 minutes ? ?Past Psychiatric History: see H&P ? ?Past Medical History:  ?Past Medical History:  ?Diagnosis Date  ? ETOH abuse   ? Hypertension   ? Seizures ([place])   ?  ?Past Surgical History:  ?Procedure Laterality Date  ? FACIAL COSMETIC SURGERY    ? ?Family History:  ?Family History   ?Problem Relation Age of Onset  ? Hypertension Mother   ? Hypertension Father   ? ?Family Psychiatric  History: see H&P ?Social History:  ?Social History  ? ?Substance and Sexual Activity  ?Alcohol Use Yes  ? Alcohol/week: 40.0 - 52.0 standard drinks  ? Types: 24 - 36 Cans of beer, 16 Shots of liquor per week  ? Comment: last drink yesterday  ?   ?Social History  ? ?Substance and Sexual Activity  ?Drug Use Not Currently  ? Frequency: 7.0 times per week  ? Types: Marijuana, "Crack" cocaine  ? Comment: "I last used on 06/13/21 before I went to the hospital"  ?  ?Social History  ? ?Socioeconomic History  ? Marital status: Single  ?  Spouse name: Not on file  ? Number of children: Not on file  ? Years of education: Not on file  ? Highest education level: Not on file  ?Occupational History  ? Not on file  ?Tobacco Use  ? Smoking status: Every Day  ?  Packs/day: 0.50  ?  Types: Cigarettes  ? Smokeless tobacco: Never  ?Vaping Use  ? Vaping Use: Never used  ?Substance and Sexual Activity  ? Alcohol use: Yes  ?  Alcohol/week: 40.0 - 52.0 standard drinks  ?  Types: 24 - 36 Cans of beer, 16 Shots of liquor per week  ?  Comment: last drink yesterday  ? Drug use: Not Currently  ?  Frequency: 7.0 times per week  ?  Types: Marijuana, "  Crack" cocaine  ?  Comment: "I last used on 06/13/21 before I went to the hospital"  ? Sexual activity: Not on file  ?Other Topics Concern  ? Not on file  ?Social History Narrative  ? Pt is a 46 y/o [place] American male with history of polysubstance abuse (cocaine, etoh & marijuana). Currently homeless and without a job.  ? ?Social Determinants of Health  ? ?Financial Resource Strain: Not on file  ?Food Insecurity: Not on file  ?Transportation Needs: Not on file  ?Physical Activity: Not on file  ?Stress: Not on file  ?Social Connections: Not on file  ? ?Additional Social History:  ?  ? Sleep: Good Per Pt he slept poorly. Charting shows he slept 7.5 hours last night ? ?Appetite:  Good ? ?Current  Medications: ?Current Facility-Administered Medications  ?Medication Dose Route Frequency Provider Last Rate Last Admin  ? acetaminophen (TYLENOL) tablet 650 mg  650 mg Oral Q6H PRN Laretta Bolster, FNP   650 mg at 06/20/21 1151  ? alum & mag hydroxide-simeth (MAALOX/MYLANTA) 200-200-20 MG/5ML suspension 30 mL  30 mL Oral Q4H PRN Ntuen, Tina C, FNP      ? amLODipine (NORVASC) tablet 5 mg  5 mg Oral Daily Rosita Kea, Vandana, MD   5 mg at 06/20/21 0739  ? famotidine (PEPCID) tablet 20 mg  20 mg Oral BID Lindon Romp A, NP   20 mg at 06/20/21 [id number]  ? gabapentin (NEURONTIN) capsule 100 mg  100 mg Oral BID Armando Reichert, MD   100 mg at 06/20/21 [id number]  ? gabapentin (NEURONTIN) capsule 200 mg  200 mg Oral QHS Armando Reichert, MD   200 mg at 06/19/21 2044  ? hydrochlorothiazide (HYDRODIURIL) tablet 12.5 mg  12.5 mg Oral Once Nicholes Rough, NP      ? Derrill Memo ON 06/21/2021] hydrochlorothiazide (HYDRODIURIL) tablet 25 mg  25 mg Oral Daily Nkwenti, Doris, NP      ? loperamide (IMODIUM) capsule 2-4 mg  2-4 mg Oral PRN Harlow Asa, MD      ? loratadine (CLARITIN) tablet 10 mg  10 mg Oral Daily Wynona Dove A, DO   10 mg at 06/20/21 0940  ? magnesium hydroxide (MILK OF MAGNESIA) suspension 30 mL  30 mL Oral Daily PRN Ntuen, Kris Hartmann, FNP      ? methocarbamol (ROBAXIN) tablet 500 mg  500 mg Oral Q8H PRN Harlow Asa, MD      ? multivitamin with minerals tablet 1 tablet  1 tablet Oral Daily Viann Fish E, MD   1 tablet at 06/20/21 [id number]  ? naproxen (NAPROSYN) tablet 500 mg  500 mg Oral BID PRN Harlow Asa, MD      ? nicotine (NICODERM CQ - dosed in mg/24 hours) patch 14 mg  14 mg Transdermal Daily Lindon Romp A, NP   14 mg at 06/20/21 0739  ? ondansetron (ZOFRAN) tablet 4 mg  4 mg Oral Q6H PRN Nkwenti, Doris, NP      ? pantoprazole (PROTONIX) EC tablet 40 mg  40 mg Oral Daily Lindon Romp A, NP   40 mg at 06/20/21 0739  ? pneumococcal 20-valent conjugate vaccine (PREVNAR 20) injection 0.5 mL  0.5 mL Intramuscular Tomorrow-1000  Nelda Marseille, Trayvond Viets E, MD      ? sertraline (ZOLOFT) tablet 100 mg  100 mg Oral Daily Armando Reichert, MD   100 mg at 06/20/21 0739  ? thiamine tablet 100 mg  100 mg Oral Daily Harlow Asa, MD  100 mg at 06/20/21 0739  ? traZODone (DESYREL) tablet 100 mg  100 mg Oral QHS PRN Nicholes Rough, NP      ? ?Lab Results:  ?No results found for this or any previous visit (from the past 48 hour(s)). ? ?Blood Alcohol level:  ?Lab Results  ?Component Value Date  ? ETH 185 (H) 06/14/2021  ? ETH 288 (H) 09/12/2020  ? ?Metabolic Disorder Labs: ?Lab Results  ?Component Value Date  ? HGBA1C 5.6 06/16/2021  ? MPG 114.02 06/16/2021  ? ?No results found for: PROLACTIN ?Lab Results  ?Component Value Date  ? CHOL 194 06/16/2021  ? TRIG 344 (H) 06/16/2021  ? HDL 72 06/16/2021  ? CHOLHDL 2.7 06/16/2021  ? VLDL 69 (H) 06/16/2021  ? [place] 53 06/16/2021  ? ?Physical Findings: ?AIMS: Facial and Oral Movements ?Muscles of Facial Expression: None, normal ?Lips and Perioral Area: None, normal ?Jaw: None, normal ?Tongue: None, normal,Extremity Movements ?Upper (arms, wrists, hands, fingers): None, normal ?Lower (legs, knees, ankles, toes): None, normal, Trunk Movements ?Neck, shoulders, hips: None, normal, Overall Severity ?Severity of abnormal movements (highest score from questions above): None, normal ?Incapacitation due to abnormal movements: None, normal ?Patient's awareness of abnormal movements (rate only patient's report): No Awareness, Dental Status ?Current problems with teeth and/or dentures?: No ?Does patient usually wear dentures?: No  ?CIWA:  CIWA-Ar Total: 4 ?COWS:  COWS Total Score: 0 ? ?Musculoskeletal: ?Strength & Muscle Tone: within normal limits ?Gait & Station: normal ?Patient leans: N/A ? ?Psychiatric Specialty Exam: ? ?Presentation  ?General Appearance: Appropriate for Environment; Fairly Groomed ? ?Eye Contact:Fair ? ?Speech:Clear and Coherent ? ?Speech Volume:Normal ? ?Handedness:Right ? ?Mood and Affect   ?Mood:Euthymic ? ?Affect:Appropriate ? ?Thought Process  ?Thought Processes:Coherent ? ?Descriptions of Associations:Intact ? ?Orientation:Full (Time, Place and Person) ? ?Thought Content:Logical ? ?History of Schizophrenia/S

## 2021-06-20 NOTE — Progress Notes (Signed)
Pt denies SI/HI/AVH and verbally agrees to approach staff if these become apparent or before harming themselves/others. Rates depression 7/10. Rates anxiety 7/10. Rates pain 0/10. Pt has been in his room lying down for the majority of the morning. Pt has gone to a few groups throughout the day and went outside. Pt has been in the dayroom for most of the evening. Scheduled medications administered to pt, per MD orders. RN provided support and encouragement to pt. Q15 min safety checks implemented and continued. Pt safe on the unit. RN will continue to monitor and intervene as needed.  ? 06/20/21 0739  ?Psych Admission Type (Psych Patients Only)  ?Admission Status Voluntary  ?Psychosocial Assessment  ?Patient Complaints None  ?Eye Contact Fair  ?Facial Expression Other (Comment) ?(WDL)  ?Affect Appropriate to circumstance  ?Speech Logical/coherent  ?Interaction Assertive;Isolative  ?Motor Activity Other (Comment) ?(WDL)  ?Appearance/Hygiene Unremarkable  ?Behavior Characteristics Cooperative;Appropriate to situation;Calm  ?Mood Pleasant  ?Thought Process  ?Coherency WDL  ?Content WDL  ?Delusions None reported or observed  ?Perception Hallucinations  ?Hallucination Visual;Auditory  ?Judgment Impaired  ?Confusion None  ?Danger to Self  ?Current suicidal ideation? Denies  ?Danger to Others  ?Danger to Others None reported or observed  ? ? ?

## 2021-06-21 DIAGNOSIS — F333 Major depressive disorder, recurrent, severe with psychotic symptoms: Principal | ICD-10-CM

## 2021-06-21 MED ORDER — PANTOPRAZOLE SODIUM 40 MG PO TBEC: 40.0000 mg | DELAYED_RELEASE_TABLET | Freq: Every day | ORAL | 0 refills | Status: DC

## 2021-06-21 MED ORDER — GABAPENTIN 100 MG PO CAPS: 100.0000 mg | ORAL_CAPSULE | Freq: Two times a day (BID) | ORAL | 0 refills | Status: DC

## 2021-06-21 MED ORDER — SERTRALINE HCL 100 MG PO TABS: 100.0000 mg | ORAL_TABLET | Freq: Every day | ORAL | 0 refills | Status: DC

## 2021-06-21 MED ORDER — NICOTINE 14 MG/24HR TD PT24: 14.0000 mg | MEDICATED_PATCH | Freq: Every day | TRANSDERMAL | 0 refills | Status: AC

## 2021-06-21 MED ORDER — GABAPENTIN 100 MG PO CAPS: 200.0000 mg | ORAL_CAPSULE | Freq: Every day | ORAL | 0 refills | Status: DC

## 2021-06-21 MED ORDER — AMLODIPINE BESYLATE 5 MG PO TABS: 5.0000 mg | ORAL_TABLET | Freq: Every day | ORAL | 0 refills | Status: DC

## 2021-06-21 MED ORDER — TRAZODONE HCL 100 MG PO TABS: 100.0000 mg | ORAL_TABLET | Freq: Every evening | ORAL | 0 refills | Status: DC | PRN

## 2021-06-21 MED ORDER — HYDROCHLOROTHIAZIDE 12.5 MG PO TABS
12.5000 mg | ORAL_TABLET | Freq: Every day | ORAL | 0 refills | Status: AC
Start: 2021-06-22 — End: 2021-07-22

## 2021-06-21 NOTE — Discharge Summary (Signed)
Physician Discharge Summary Note ? ?Patient:  Paul Lambert is an 46 y.o., male ?MRN:  [id number] ?DOB:  1975-11-18 ?Patient phone:  [phone] (home)  ?Patient address:   ?[address] ?[place] [address],  ?Total Time spent with patient: 30 minutes ? ?Date of Admission:  06/15/2021 ?Date of Discharge: 06/21/2021 ? ?Reason For Admission:  Patient is a 46 year old male with past psychiatric history of alcohol dependence, cocaine use, seizures with medical history of HTN initially presented to [facility], ED due to [facility] with a plan to cut his wrist or shoot himself with a gun. ?                                     ?                                HOSPITAL COURSE ?During the patient's hospitalization, patient had extensive initial psychiatric evaluation, and follow-up psychiatric evaluations every day.  Diagnoses provided & medications started upon initial assessment were as follows: ?-Zoloft 25 mg for MDD ?-Nicotine patch 14 mg daily for nicotine dependence ?-Loratadine 10 mg daily for seasonal allergies ?-Protonix 40 mg daily & Pepcid 20 mg BID for GERD ?-Thiamine & Multivitamin replacements due to ETOH dependence ?  ?During the hospitalization, other adjustments were made to the patient's medication regimen, with medications at discharge being as follows: ?-Zoloft 100 mg for MDD ?-Nicotine patch 14 mg daily for nicotine dependence ?-Gabapentin 100 mg BID and 200 mg nightly for ETOH cravings/anxiety ?-Protonix 40 mg daily & Pepcid 20 mg BID for GERD ?-Trazodone 100 mg Q H S for insomnia ?-Norvasc 5 mg daily for hypertension ?-Hydrochlorothiazide 12.5 mg daily for hypertension ?During this admission, pt was started on Lisinopril for management of his hypertension, but it was stopped due to angioedema, and Norvasc 5 mg daily was started. ?  ?The patient denies having side effects to prescribed psychiatric medication. Patient's care was discussed during the interdisciplinary team meeting every day during the  hospitalization.The patient was evaluated each day by a clinical provider to ascertain response to treatment. Improvement was noted by the patient's report of decreasing symptoms, improved sleep and appetite, affect, medication tolerance, behavior, and participation in unit programming.  Patient was asked each day to complete a self inventory noting mood, mental status, pain, new symptoms, anxiety and concerns.   ?  ?Symptoms were reported as significantly decreased or resolved completely by discharge. On day of discharge, the patient reports that their mood is stable. The patient denied having suicidal thoughts for more than 48 hours prior to discharge.  Patient denies having homicidal thoughts.  Patient denies having auditory hallucinations.  Patient denies any visual hallucinations or other symptoms of psychosis. The patient was motivated to continue taking medication with a goal of continued improvement in mental health.  ?  ?The patient reports their target psychiatric symptoms of alcohol cravings,  psychosis & depression responded well to the psychiatric medications, and the patient reports overall benefit from this psychiatric hospitalization. Supportive psychotherapy was provided to the patient. The patient also participated in regular group therapy while hospitalized. Coping skills, problem solving as well as relaxation therapies were also part of the unit programming. ?  ?Total Time spent with patient: 30 minutes ?  ?Principal Problem: MDD (major depressive disorder), recurrent, severe, with psychosis ([facility]) ?Discharge Diagnoses: Principal  Problem: ?  MDD (major depressive disorder), recurrent, severe, with psychosis ([facility]) ?Active Problems: ?  Hypertension ?  Seizures ([facility]) ?  Alcohol dependence ([facility]) ?  Cocaine use ?  Severe recurrent major depression with psychotic features ([facility]) ?  Nicotine dependence ?  Angioedema of lips ? ?Past Psychiatric History: As above ? ?Past Medical History:  ?Past Medical  History:  ?Diagnosis Date  ? ETOH abuse   ? Hypertension   ? Seizures ([facility])   ?  ?Past Surgical History:  ?Procedure Laterality Date  ? FACIAL COSMETIC SURGERY    ? ?Family History:  ?Family History  ?Problem Relation Age of Onset  ? Hypertension Mother   ? Hypertension Father   ? ?Family Psychiatric  History: none reported ?Social History:  ?Social History  ? ?Substance and Sexual Activity  ?Alcohol Use Yes  ? Alcohol/week: 40.0 - 52.0 standard drinks  ? Types: 24 - 36 Cans of beer, 16 Shots of liquor per week  ? Comment: last drink yesterday  ?   ?Social History  ? ?Substance and Sexual Activity  ?Drug Use Not Currently  ? Frequency: 7.0 times per week  ? Types: Marijuana, "Crack" cocaine  ? Comment: "I last used on 06/13/21 before I went to the hospital"  ?  ?Social History  ? ?Socioeconomic History  ? Marital status: Single  ?  Spouse name: Not on file  ? Number of children: Not on file  ? Years of education: Not on file  ? Highest education level: Not on file  ?Occupational History  ? Not on file  ?Tobacco Use  ? Smoking status: Every Day  ?  Packs/day: 0.50  ?  Types: Cigarettes  ? Smokeless tobacco: Never  ?Vaping Use  ? Vaping Use: Never used  ?Substance and Sexual Activity  ? Alcohol use: Yes  ?  Alcohol/week: 40.0 - 52.0 standard drinks  ?  Types: 24 - 36 Cans of beer, 16 Shots of liquor per week  ?  Comment: last drink yesterday  ? Drug use: Not Currently  ?  Frequency: 7.0 times per week  ?  Types: Marijuana, "Crack" cocaine  ?  Comment: "I last used on 06/13/21 before I went to the hospital"  ? Sexual activity: Not on file  ?Other Topics Concern  ? Not on file  ?Social History Narrative  ? Pt is a 46 y/o [place] American male with history of polysubstance abuse (cocaine, etoh & marijuana). Currently homeless and without a job.  ? ?Social Determinants of Health  ? ?Financial Resource Strain: Not on file  ?Food Insecurity: Not on file  ?Transportation Needs: Not on file  ?Physical Activity: Not on file   ?Stress: Not on file  ?Social Connections: Not on file  ? ?Physical Findings: ?AIMS: Facial and Oral Movements ?Muscles of Facial Expression: None, normal ?Lips and Perioral Area: None, normal ?Jaw: None, normal ?Tongue: None, normal,Extremity Movements ?Upper (arms, wrists, hands, fingers): None, normal ?Lower (legs, knees, ankles, toes): None, normal, Trunk Movements ?Neck, shoulders, hips: None, normal, Overall Severity ?Severity of abnormal movements (highest score from questions above): None, normal ?Incapacitation due to abnormal movements: None, normal ?Patient's awareness of abnormal movements (rate only patient's report): No Awareness, Dental Status ?Current problems with teeth and/or dentures?: No ?Does patient usually wear dentures?: No  ?CIWA:  CIWA-Ar Total: 4 ?COWS:  COWS Total Score: 0 ? ?Musculoskeletal: ?Strength & Muscle Tone: within normal limits ?Gait & Station: normal ?Patient leans: N/A ? ?Psychiatric Specialty Exam: ? ?Presentation  ?  General Appearance: Appropriate for Environment; Fairly Groomed ? ?Eye Contact:Good ? ?Speech:Clear and Coherent ? ?Speech Volume:Normal ? ?Handedness:Right ? ?Mood and Affect  ?Mood:Euthymic ? ?Affect:Appropriate ? ?Thought Process  ?Thought Processes:Coherent ? ?Descriptions of Associations:Intact ? ?Orientation:Full (Time, Place and Person) ? ?Thought Content:Logical ? ?History of Schizophrenia/Schizoaffective disorder:No ? ?Duration of Psychotic Symptoms:Less than six months ? ?Hallucinations:Hallucinations: None ? ?Ideas of Reference:None ? ?Suicidal Thoughts:Suicidal Thoughts: No ? ?Homicidal Thoughts:Homicidal Thoughts: No ? ?Sensorium  ?Memory:Immediate Good; Recent Good; Remote Good ? ?Judgment:Good ? ?Insight:Good ? ?Executive Functions  ?Concentration:Good ? ?Attention Span:Good ? ?Recall:Good ? ?Fund of Knowledge:Good ? ?Language:Good ? ?Psychomotor Activity  ?Psychomotor Activity:Psychomotor Activity: Normal ? ?Assets  ?Assets:Communication  Skills ? ?Sleep  ?Sleep:Sleep: Good ? ?Physical Exam: ?Physical Exam ?Constitutional:   ?   Appearance: Normal appearance.  ?HENT:  ?   Head: Normocephalic.  ?   Nose: Nose normal. No congestion or rhinorrhea.  ?Eyes:  ?

## 2021-06-21 NOTE — Group Note (Signed)
Recreation Therapy Group Note ? ? ?Group Topic:Healthy Decision Making  ?Group Date: 06/21/2021 ?Start Time: 1000 ?End Time: 1035 ?Facilitators: Caroll Rancher, LRT,CTRS ?Location: [address] Day[address] ? ? ?Goal Area(s) Addresses:  ?Patient will effectively work with peer towards shared goal.  ?Patient will identify factors that guided their decision making.  ?Patient will pro-socially communicate ideas during group session.  ?  ?Group Description:  Patients were given a scenario that they were going to into space for several months and needed to bring 15 things necessary for their "survival". The word survival was not defined for the patient, allowing for open interpretation and self-exploration of current values.The list of items selected was prioritized most important to least. Each patient would come up with their own list, then work together to create a combined list of 15 items with a small group of 3-5 peers. LRT discussed each persons list and how it differed from others. The debrief included discussion of priorities, good decisions versus bad decisions, and how it is important to think before acting so we can make the best decision possible. LRT tied the concept of effective communication among group members to patient's support systems outside of the hospital and its benefit post discharge. ? ? ?Affect/Mood: Appropriate ?  ?Participation Level: Engaged ?  ?Participation Quality: Independent ?  ?Behavior: Appropriate ?  ?Speech/Thought Process: Focused ?  ?Insight: Good ?  ?Judgement: Good ?  ?Modes of Intervention: Activity ?  ?Patient Response to Interventions:  Engaged ?  ?Education Outcome: ? Acknowledges education and In group clarification offered   ? ?Clinical Observations/Individualized Feedback: Pt was bright, attentive and engaged.  Pt was able to come up with some things for his list like, food, medicine, clothes, soap, toothbrush and toothpaste.  Pt worked well with peers in coming up with a list.   Pt was appropriate throughout group session.   ? ? ?Plan: Continue to engage patient in RT group sessions 2-3x/week. ? ? ?Caroll Rancher, LRT,CTRS ?06/21/2021 11:44 AM ?

## 2021-06-21 NOTE — Progress Notes (Signed)
?  [facility] Adult Case Management Discharge Plan : ? ?Will you be returning to the same living situation after discharge:  No. Will be staying with girlfriend  ?At discharge, do you have transportation home?: Yes,  girlfriend to pick this patient up ?Do you have the ability to pay for your medications: No. Samples to be provided at discharge  ? ?Release of information consent forms completed and in the chart;  Patient's signature needed at discharge. ? ?Patient to Follow up at: ? Follow-up Information   ? ? Services, [facility]. Call.   ?Why: Please call to schedule a hospital follow up appointment to establish care with this provider for group therapy and medication management services, or you may go to this provider during walk in hours; M-F, from 8 am to 2 pm. ?Contact information: ?[address] ?[place] [address] ?[phone] ? ? ?  ?  ? ? Center, [facility]. Call.   ?Why: A referral has been made on your behalf. Please call daily to check the status of bed placement. ?Contact information: ?[address] ?[place] [address] ?[phone] ? ? ?  ?  ? ?  ?  ? ?  ? ? ?Next level of care provider has access to [facility] Link:no ? ?Safety Planning and Suicide Prevention discussed: Yes,  with girlfriend ? ?  ? ?Has patient been referred to the Quitline?: Yes, faxed on 06/21/2021 ? ?Patient has been referred for addiction treatment: Yes ? ?Otelia Santee, LCSW ?06/21/2021, 9:58 AM ?

## 2021-06-21 NOTE — Progress Notes (Signed)
Paul Lambert D/C'd Home per MD order.  Discussed with the patient and all questions fully answered. ? ?VSS, Skin clean, dry and intact without evidence of skin break down, no evidence of skin tears noted. ? ?An After Visit Summary was printed and given to the patient. Patient received prescription information to collect his medication in the pharmacy. Patient belongings in locker 15 given to him at discharged.  ? ?D/c education completed with patient including follow up instructions, medication list, d/c activities limitations if indicated, with other d/c instructions as indicated by MD - patient able to verbalize understanding, all questions fully answered.  ? ?Patient instructed to return to ED, call 911, or call MD for any changes in condition.  ? ?Patient escorted to the main entrance, and D/C home via private auto at 1310. . ? ?[name] Matilde Sprang ?06/21/2021 1:25 PM  ?

## 2021-06-21 NOTE — Group Note (Signed)
LCSW Group Therapy Note ? ? ?Group Date: 06/21/2021 ?Start Time: 1300 ?End Time: 1400 ? ?Type of Therapy and Topic:  Group Therapy:  Setting Goals ? ?Participation Level:  Did Not Attend ? ?Description of Group: ?In this process group, patients discussed using strengths to work toward goals and address challenges.  Patients identified two positive things about themselves and one goal they were working on.  Patients were given the opportunity to share openly and support each other?s plan for self-empowerment.  The group discussed the value of gratitude and were encouraged to have a daily reflection of positive characteristics or circumstances.  Patients were encouraged to identify a plan to utilize their strengths to work on current challenges and goals. ? ?Therapeutic Goals ?Patient will verbalize personal strengths/positive qualities and relate how these can assist with achieving desired personal goals ?Patients will verbalize affirmation of peers plans for personal change and goal setting ?Patients will explore the value of gratitude and positive focus as related to successful achievement of goals ?Patients will verbalize a plan for regular reinforcement of personal positive qualities and circumstances. ? ? ? ?Summary of Patient Progress: Did not attend ? ? ? ?Therapeutic Modalities ?Cognitive Behavioral Therapy ?Motivational Interviewing ? ? ? ?Albertus Chiarelli MSW, LCSW ?Clincal Social Worker  ?[facility]  ?

## 2021-06-21 NOTE — [facility] Suicide Risk Assessment (Addendum)
Suicide Risk Assessment ? ?Discharge Assessment    ?[facility] Discharge Suicide Risk Assessment ? ? ?Principal Problem: MDD (major depressive disorder), recurrent, severe, with psychosis (HCC) ?Discharge Diagnoses: Principal Problem: ?  MDD (major depressive disorder), recurrent, severe, with psychosis (HCC) ?Active Problems: ?  Hypertension ?  Seizures (HCC) ?  Alcohol dependence (HCC) ?  Cocaine use ?  Severe recurrent major depression with psychotic features (HCC) ?  Nicotine dependence ?  Angioedema of lips ? ?Reason For Admission:  Patient is a 46 year old male with past psychiatric history of alcohol dependence, cocaine use, seizures with medical history of HTN initially presented to [facility] ED due to [facility] with a plan to cut his wrist or shoot himself with a gun. ? ?                               HOSPITAL COURSE ?During the patient's hospitalization, patient had extensive initial psychiatric evaluation, and follow-up psychiatric evaluations every day.  Diagnoses provided & medications started upon initial assessment were as follows: ?-Zoloft 25 mg for MDD ?-Nicotine patch 14 mg daily for nicotine dependence ?-Loratadine 10 mg daily for seasonal allergies ?-Protonix 40 mg daily & Pepcid 20 mg BID for GERD ?-Thiamine & Multivitamin replacements due to ETOH dependence ? ?During the hospitalization, other adjustments were made to the patient's medication regimen, with medications at discharge being as follows: ?-Zoloft 100 mg for MDD ?-Nicotine patch 14 mg daily for nicotine dependence ?-Gabapentin 100 mg BID and 200 mg nightly for ETOH cravings/anxiety ?-Protonix 40 mg daily & Pepcid 20 mg BID for GERD ?-Trazodone 100 mg Q H S for insomnia ?-Norvasc 5 mg daily for hypertension ?-Hydrochlorothiazide 12.5 mg daily for hypertension ?During this admission, pt was started on Lisinopril for management of his hypertension, but it was stopped due to angioedema, and Norvasc 5 mg daily was started. ? ?The patient denies having  side effects to prescribed psychiatric medication. Patient's care was discussed during the interdisciplinary team meeting every day during the hospitalization.The patient was evaluated each day by a clinical provider to ascertain response to treatment. Improvement was noted by the patient's report of decreasing symptoms, improved sleep and appetite, affect, medication tolerance, behavior, and participation in unit programming.  Patient was asked each day to complete a self inventory noting mood, mental status, pain, new symptoms, anxiety and concerns.   ? ?Symptoms were reported as significantly decreased or resolved completely by discharge. On day of discharge, the patient reports that their mood is stable. The patient denied having suicidal thoughts for more than 48 hours prior to discharge.  Patient denies having homicidal thoughts.  Patient denies having auditory hallucinations.  Patient denies any visual hallucinations or other symptoms of psychosis. The patient was motivated to continue taking medication with a goal of continued improvement in mental health.  ? ?The patient reports their target psychiatric symptoms of alcohol cravings,  psychosis & depression responded well to the psychiatric medications, and the patient reports overall benefit from this psychiatric hospitalization. Supportive psychotherapy was provided to the patient. The patient also participated in regular group therapy while hospitalized. Coping skills, problem solving as well as relaxation therapies were also part of the unit programming. ? ?Total Time spent with patient: 30 minutes ? ?Musculoskeletal: ?Strength & Muscle Tone: within normal limits ?Gait & Station: normal ?Patient leans: N/A ? ?Psychiatric Specialty Exam ? ?Presentation  ?General Appearance: Appropriate for Environment; Fairly Groomed ? ?Eye Contact:Good ? ?  Speech:Clear and Coherent ? ?Speech Volume:Normal ? ?Handedness:Right ? ?Mood and Affect  ?Mood:Euthymic ? ?Duration  of Depression Symptoms: Less than two weeks ? ?Affect:Appropriate ? ?Thought Process  ?Thought Processes:Coherent ? ?Descriptions of Associations:Intact ? ?Orientation:Full (Time, Place and Person) ? ?Thought Content:Logical ? ?History of Schizophrenia/Schizoaffective disorder:No ? ?Duration of Psychotic Symptoms:Less than six months ? ?Hallucinations:Hallucinations: None ? ?Ideas of Reference:None ? ?Suicidal Thoughts:Suicidal Thoughts: No ? ?Homicidal Thoughts:Homicidal Thoughts: No ? ?Sensorium  ?Memory:Immediate Good; Recent Good; Remote Good ? ?Judgment:Good ? ?Insight:Good ? ?Executive Functions  ?Concentration:Good ? ?Attention Span:Good ? ?Recall:Good ? ?Fund of Knowledge:Good ? ?Language:Good ? ?Psychomotor Activity  ?Psychomotor Activity:Psychomotor Activity: Normal ? ?Assets  ?Assets:Communication Skills ? ?Sleep  ?Sleep:Sleep: Good ? ?Physical Exam: ?Physical Exam ?Constitutional:   ?   Appearance: Normal appearance.  ?HENT:  ?   Head: Normocephalic.  ?   Nose: Nose normal. No congestion or rhinorrhea.  ?Eyes:  ?   Pupils: Pupils are equal, round, and reactive to light.  ?Pulmonary:  ?   Effort: Pulmonary effort is normal.  ?Musculoskeletal:     ?   General: Normal range of motion.  ?   Cervical back: Normal range of motion.  ?Neurological:  ?   Mental Status: He is alert and oriented to person, place, and time.  ?   Sensory: No sensory deficit.  ?   Coordination: Coordination normal.  ?Psychiatric:     ?   Behavior: Behavior normal.     ?   Thought Content: Thought content normal.  ? ?Review of Systems  ?Constitutional: Negative.  Negative for fever.  ?HENT: Negative.    ?Eyes: Negative.   ?Respiratory: Negative.    ?Cardiovascular: Negative.   ?Gastrointestinal: Negative.  Negative for heartburn.  ?Genitourinary: Negative.   ?Musculoskeletal: Negative.   ?Skin: Negative.   ?Neurological: Negative.   ?Psychiatric/Behavioral:  Positive for depression (symptoms are resolving with current medications. Pt  currently denies SI/HI/AVH and verbally contracts for safety outside of the hospital) and substance abuse (alcohol abuse). Negative for hallucinations and memory loss. The patient is not nervous/anxious (anxiety is resolving with medications). Insomnia: insomnia is resolving with medications.  ?Blood pressure (!) 134/94, pulse 88, temperature 98.3 ?F (36.8 ?C), temperature source Oral, resp. rate 16, SpO2 100 %. There is no height or weight on file to calculate BMI. ? ?Mental Status Per Nursing Assessment::   ?On Admission:  Suicidal ideation indicated by patient, Self-harm thoughts ? ?Demographic Factors:  ?Male ? ?Loss Factors: ?NA ? ?Historical Factors: ?NA ? ?Risk Reduction Factors:   ?Positive social support ? ?Continued Clinical Symptoms:  ?Alcohol/Substance Abuse/Dependencies ? ?Cognitive Features That Contribute To Risk:  ?None   ? ?Suicide Risk:  ?Mild:   There are no identifiable suicide plans, no associated intent, mild dysphoria and related symptoms, good self-control (both objective and subjective assessment), few other risk factors, and identifiable protective factors, including available and accessible social support. ? ? ? Follow-up Information   ? ? Services, [facility]. Call.   ?Why: Please call to schedule a hospital follow up appointment to establish care with this provider for group therapy and medication management services, or you may go to this provider during walk in hours; M-F, from 8 am to 2 pm. ?Contact information: ?[address] ?[place] [address] ?[phone] ? ? ?  ?  ? ? Center, [facility]. Call.   ?Why: A referral has been made on your behalf. Please call to complete phone  interview at [phone]. ?Contact information: ?[address] ?[place] [address] ?[phone] ? ? ?  ?  ? ?  ?  ? ?  ? ?Plan Of Care/Follow-up recommendations:  ?Labs were reviewed with the patient, and abnormal results were discussed with the  patient. ? ?The patient is able to verbalize their individual safety plan to this provider. ? ?# It is recommended to the patient to continue psychiatric medications as prescribed, after discharge from the hospit

## 2021-06-21 NOTE — Plan of Care (Signed)
Patient was unable to complete goal due to attending one recreational therapy group session before discharge. ? ? ?Caroll Rancher, LRT,CTRS ?

## 2021-06-21 NOTE — Progress Notes (Signed)
Recreation Therapy Notes ? ?INPATIENT RECREATION TR PLAN ? ?Patient Details ?Name: Paul Lambert ?MRN: [id number] ?DOB: 14-Oct-1975 ?Today's Date: 06/21/2021 ? ?Rec Therapy Plan ?Is patient appropriate for Therapeutic Recreation?: Yes ?Treatment times per week: about 3 days ?Estimated Length of Stay: 5-7 days ?TR Treatment/Interventions: Group participation (Comment) ? ?Discharge Criteria ?Pt will be discharged from therapy if:: Discharged ?Treatment plan/goals/alternatives discussed and agreed upon by:: Patient/family ? ?Discharge Summary ?Short term goals set: See patient care plan ?Short term goals met: Not met ?Progress toward goals comments: Groups attended ?Which groups?: Other (Comment) (Decision Making) ?Reason goals not met: Pt attended one group session. ?Therapeutic equipment acquired: N/A ?Reason patient discharged from therapy: Discharge from hospital ?Pt/family agrees with progress & goals achieved: Yes ?Date patient discharged from therapy: 06/21/21 ? ? ?Victorino Sparrow, LRT,CTRS ?Victorino Sparrow A ?06/21/2021, 11:56 AM ?

## 2021-06-21 NOTE — Progress Notes (Signed)
?   06/20/21 2000  ?Psych Admission Type (Psych Patients Only)  ?Admission Status Voluntary  ?Psychosocial Assessment  ?Patient Complaints None  ?Eye Contact Fair  ?Facial Expression Animated  ?Affect Appropriate to circumstance  ?Speech Logical/coherent  ?Interaction Assertive  ?Motor Activity Slow  ?Appearance/Hygiene Unremarkable  ?Behavior Characteristics Appropriate to situation;Cooperative  ?Mood Pleasant  ?Thought Process  ?Coherency WDL  ?Content WDL  ?Delusions None reported or observed  ?Perception WDL  ?Hallucination None reported or observed  ?Judgment Impaired  ?Confusion None  ?Danger to Self  ?Current suicidal ideation? Denies  ?Danger to Others  ?Danger to Others None reported or observed  ? ? ?

## 2021-07-21 DIAGNOSIS — Y907 Blood alcohol level of 200-239 mg/100 ml: Secondary | ICD-10-CM | POA: Insufficient documentation

## 2021-07-21 DIAGNOSIS — R451 Restlessness and agitation: Secondary | ICD-10-CM | POA: Insufficient documentation

## 2021-07-21 DIAGNOSIS — F1090 Alcohol use, unspecified, uncomplicated: Secondary | ICD-10-CM | POA: Insufficient documentation

## 2021-07-21 DIAGNOSIS — Z0279 Encounter for issue of other medical certificate: Secondary | ICD-10-CM | POA: Insufficient documentation

## 2021-07-22 ENCOUNTER — Encounter (HOSPITAL_COMMUNITY): Payer: Self-pay | Admitting: Emergency Medicine

## 2021-07-22 ENCOUNTER — Emergency Department (HOSPITAL_COMMUNITY)
Admission: EM | Admit: 2021-07-22 | Discharge: 2021-07-22 | Disposition: A | Discharge status: Home / Self Care | Payer: Self-pay | Attending: Emergency Medicine | Admitting: Emergency Medicine

## 2021-07-22 DIAGNOSIS — F1090 Alcohol use, unspecified, uncomplicated: Secondary | ICD-10-CM

## 2021-07-22 LAB — SALICYLATE LEVEL: Salicylate Lvl: <7 mg/dL — ABNORMAL LOW (ref 7.0–30.0)

## 2021-07-22 LAB — COMPREHENSIVE METABOLIC PANEL
ALT: 29 U/L (ref 0–44)
AST: 31 U/L (ref 15–41)
Albumin: 4.3 g/dL (ref 3.5–5.0)
Alkaline Phosphatase: 50 U/L (ref 38–126)
Anion gap: 9 (ref 5–15)
BUN: 10 mg/dL (ref 6–20)
CO2: 23 mmol/L (ref 22–32)
Calcium: 8.2 mg/dL — ABNORMAL LOW (ref 8.9–10.3)
Chloride: 109 mmol/L (ref 98–111)
Creatinine, Ser: 1.16 mg/dL (ref 0.61–1.24)
GFR, Estimated: >60 mL/min (ref >60)
Glucose, Bld: 91 mg/dL (ref 70–99)
Potassium: 3.5 mmol/L (ref 3.5–5.1)
Sodium: 141 mmol/L (ref 135–145)
Total Bilirubin: 0.8 mg/dL (ref 0.3–1.2)
Total Protein: 7.3 g/dL (ref 6.5–8.1)

## 2021-07-22 LAB — CBC
HCT: 42.4 % (ref 39.0–52.0)
Hemoglobin: 14.6 g/dL (ref 13.0–17.0)
MCH: 31.1 pg (ref 26.0–34.0)
MCHC: 34.4 g/dL (ref 30.0–36.0)
MCV: 90.4 fL (ref 80.0–100.0)
Platelets: 235 10*3/uL (ref 150–400)
RBC: 4.69 MIL/uL (ref 4.22–5.81)
RDW: 12.6 % (ref 11.5–15.5)
WBC: 5.8 10*3/uL (ref 4.0–10.5)
nRBC: 0 % (ref 0.0–0.2)

## 2021-07-22 LAB — ETHANOL: Alcohol, Ethyl (B): 217 mg/dL — ABNORMAL HIGH (ref <10)

## 2021-07-22 LAB — ACETAMINOPHEN LEVEL: Acetaminophen (Tylenol), Serum: <10 ug/mL — ABNORMAL LOW (ref 10–30)
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
## 2021-07-22 NOTE — ED Notes (Signed)
Pt requesting for his home medications. Pt is now yelling out for his meds and starting to act out. EDP made aware and to assess pt as soon as he can. Security called to stand by. Will continue to monitor.

## 2021-08-06 IMAGING — DX DG CHEST 2V
2 series · 2 of 2 positions shown · non-contrast
Comparison: July 31, 2020.

CLINICAL DATA: Productive cough, shortness of breath.

EXAM:
CHEST - 2 VIEW

[chest pa]
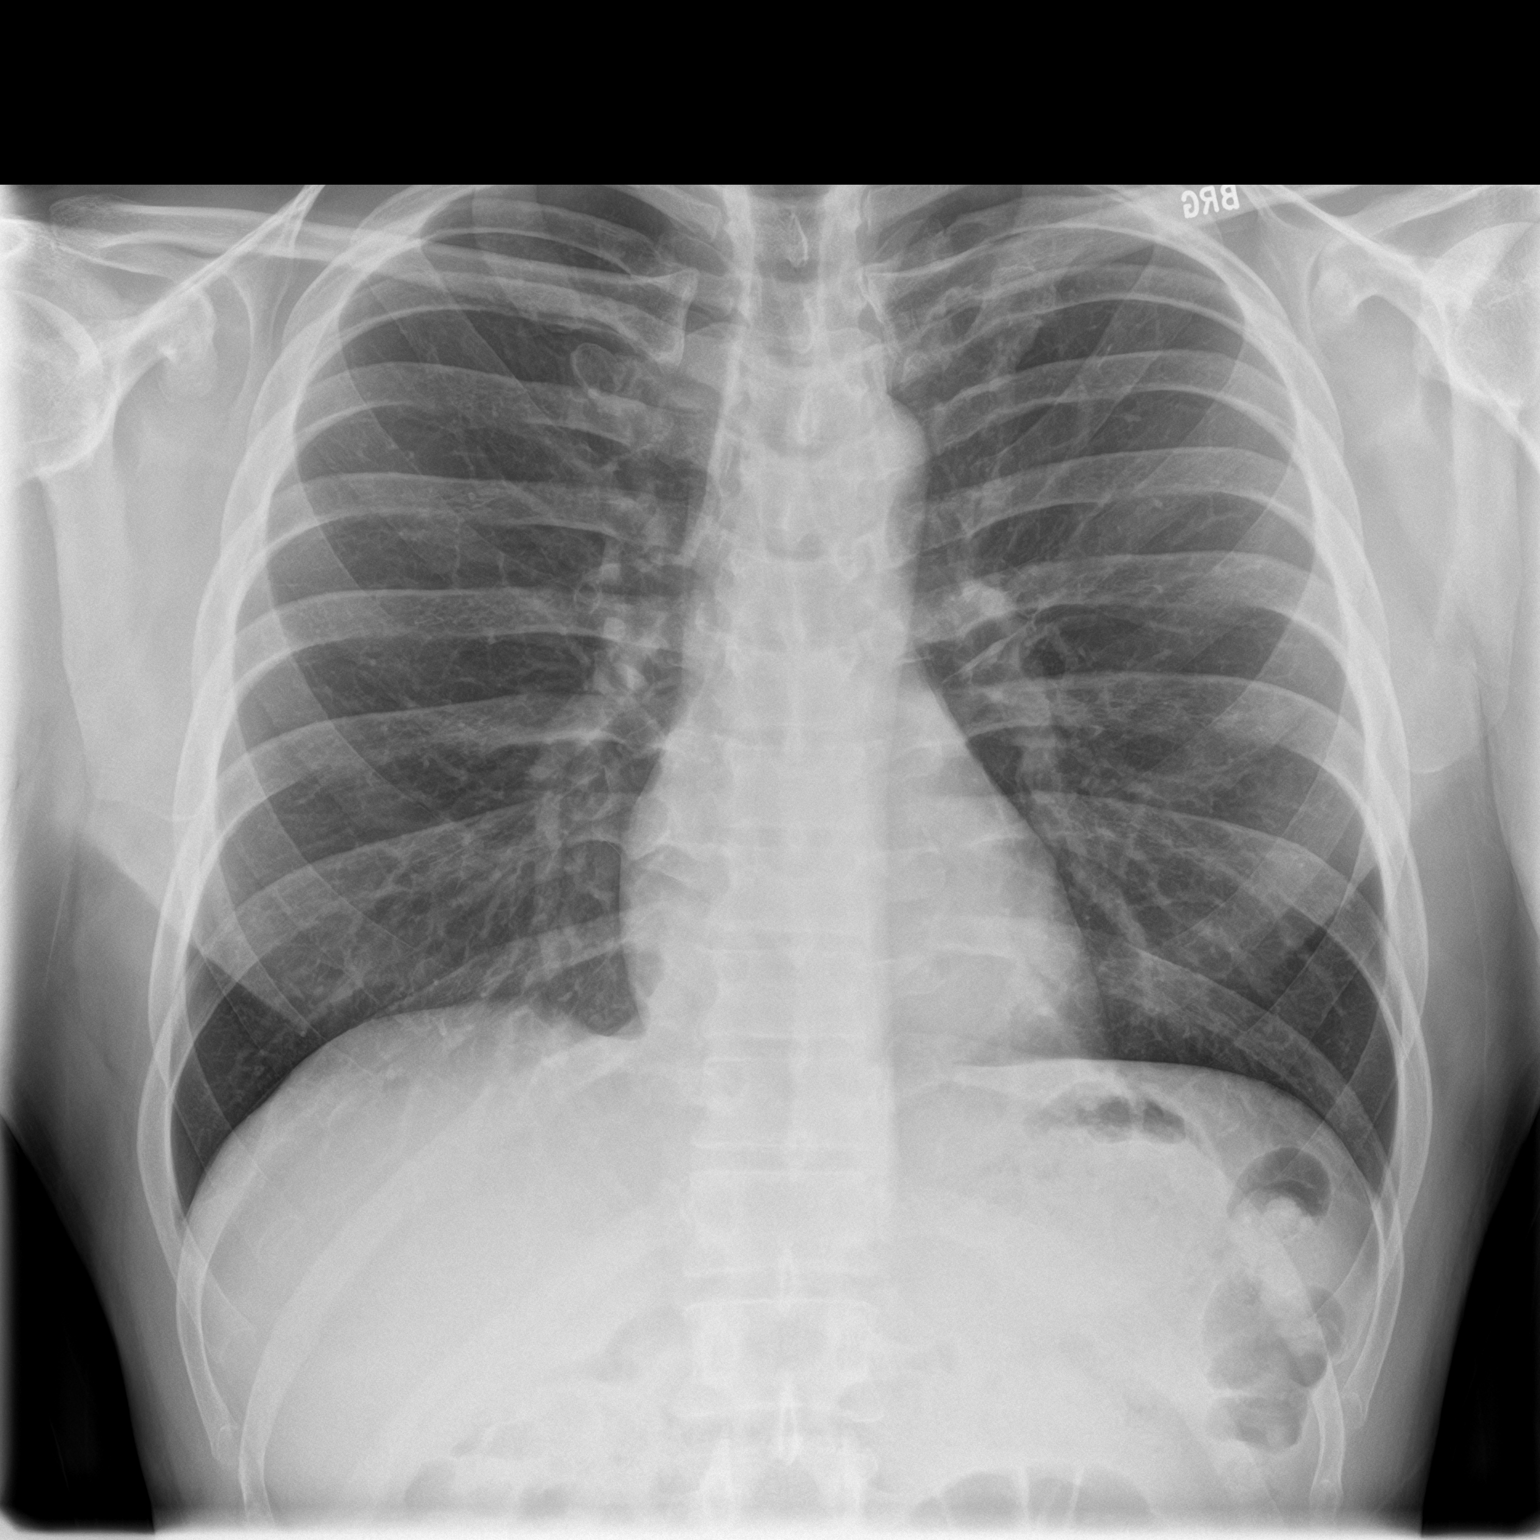

[chest lat]
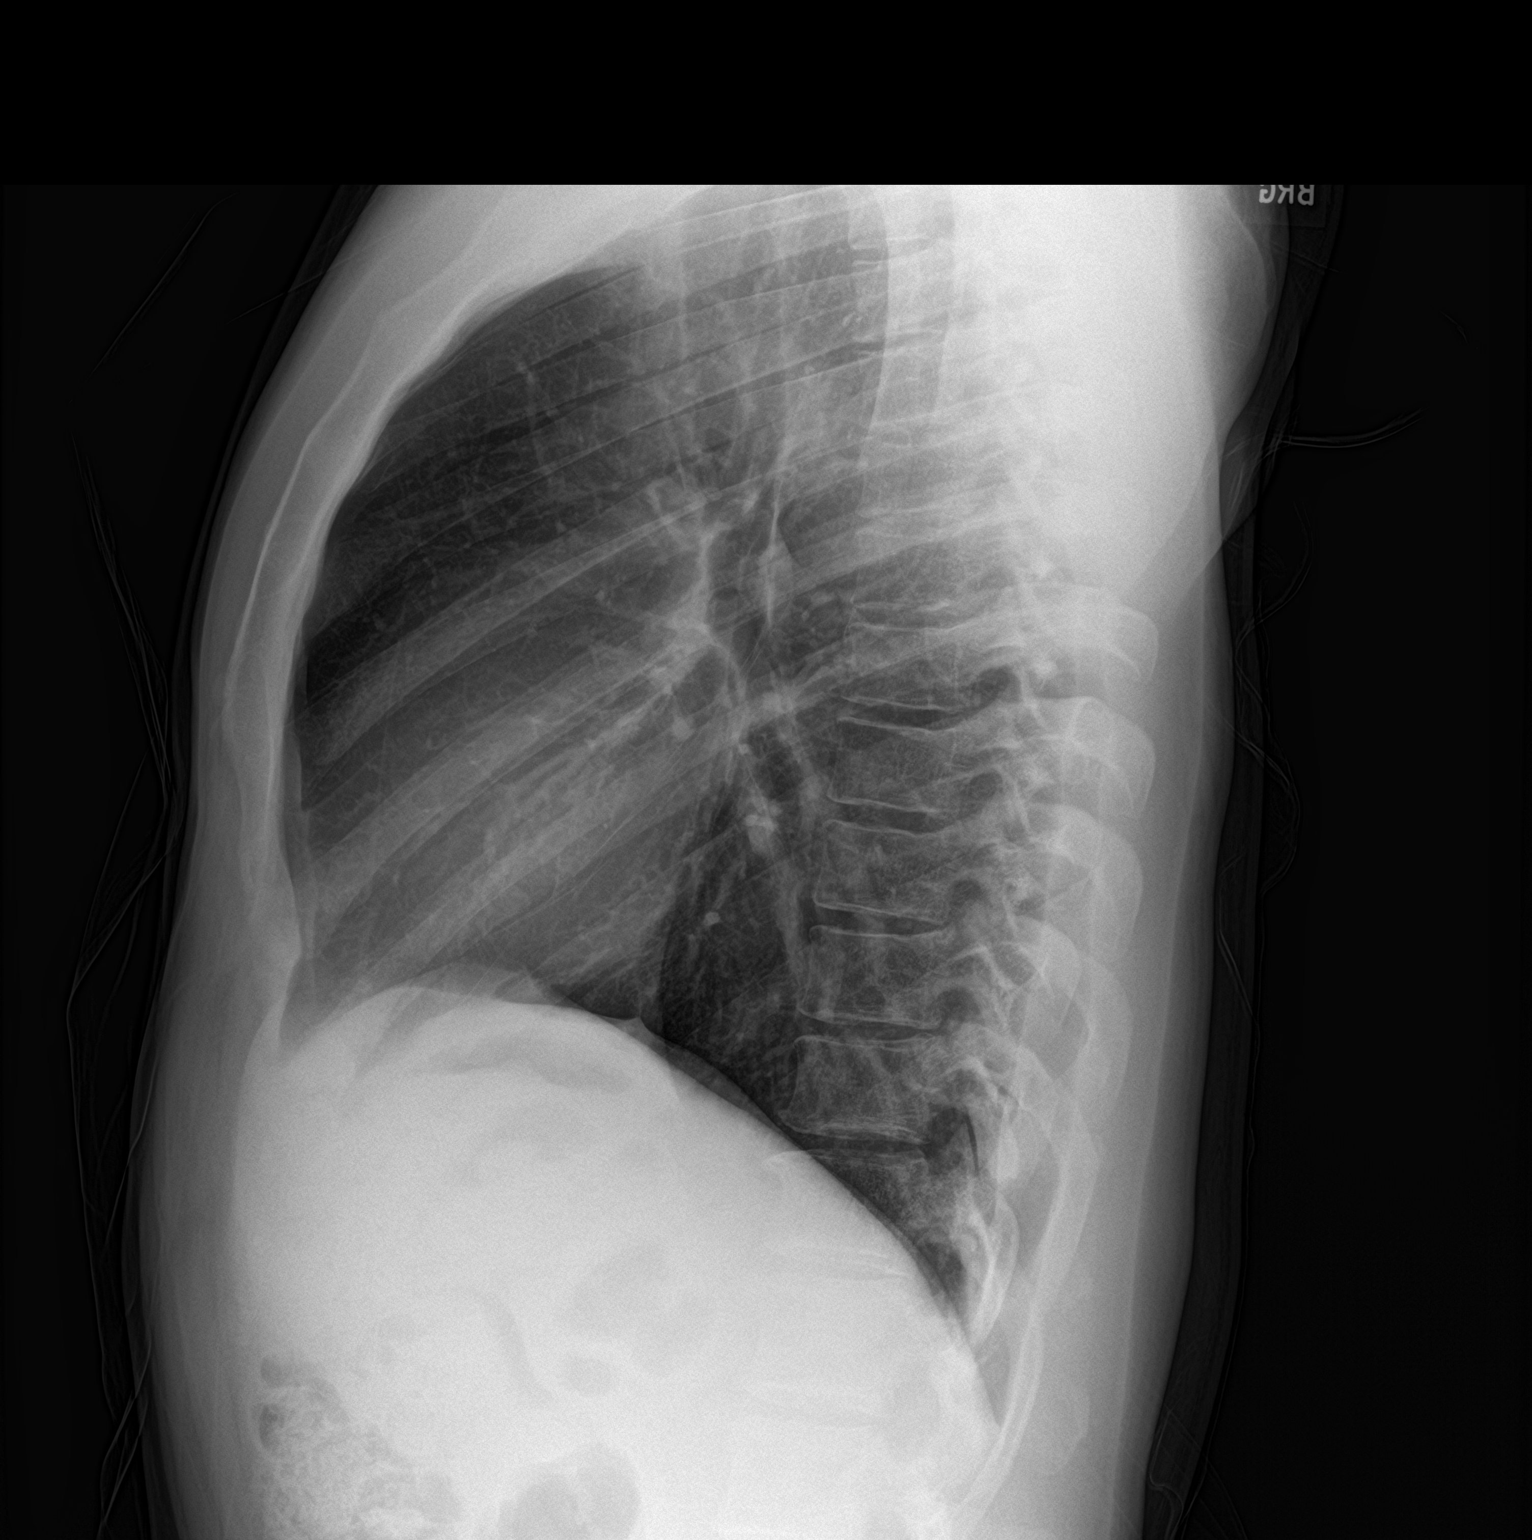

[2 of 2 positions shown; findings below may reference images not displayed]

FINDINGS: The heart size and mediastinal contours are within normal limits.
Both lungs are clear. The visualized skeletal structures are
unremarkable.
IMPRESSION: No active cardiopulmonary disease.

## 2021-09-12 IMAGING — DX DG CHEST 2V
2 series · 2 of 2 positions shown · non-contrast
Comparison: Radiograph 08/30/2020

CLINICAL DATA: Chest pain for 2 days

EXAM:
CHEST - 2 VIEW

[chest pa]
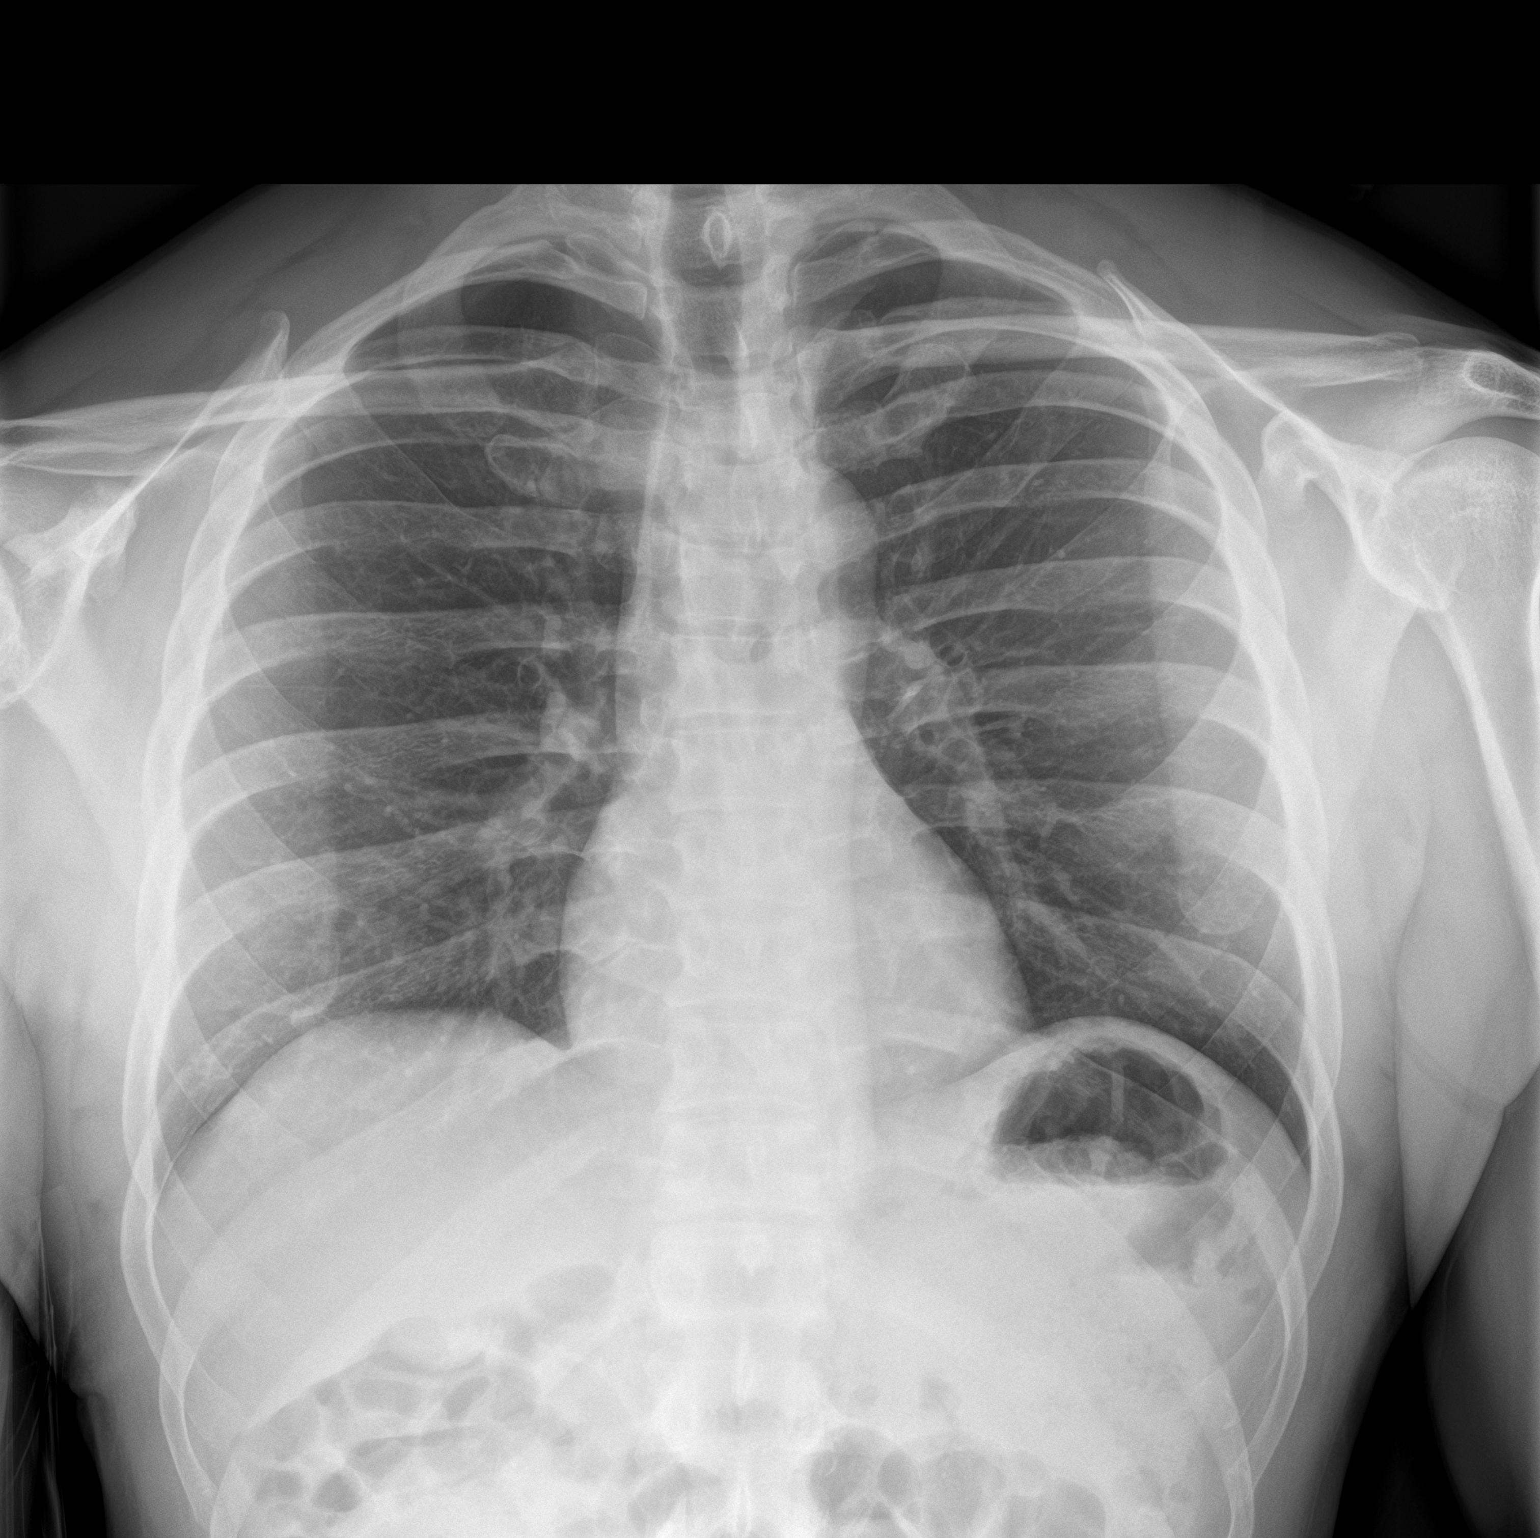

[chest lat]
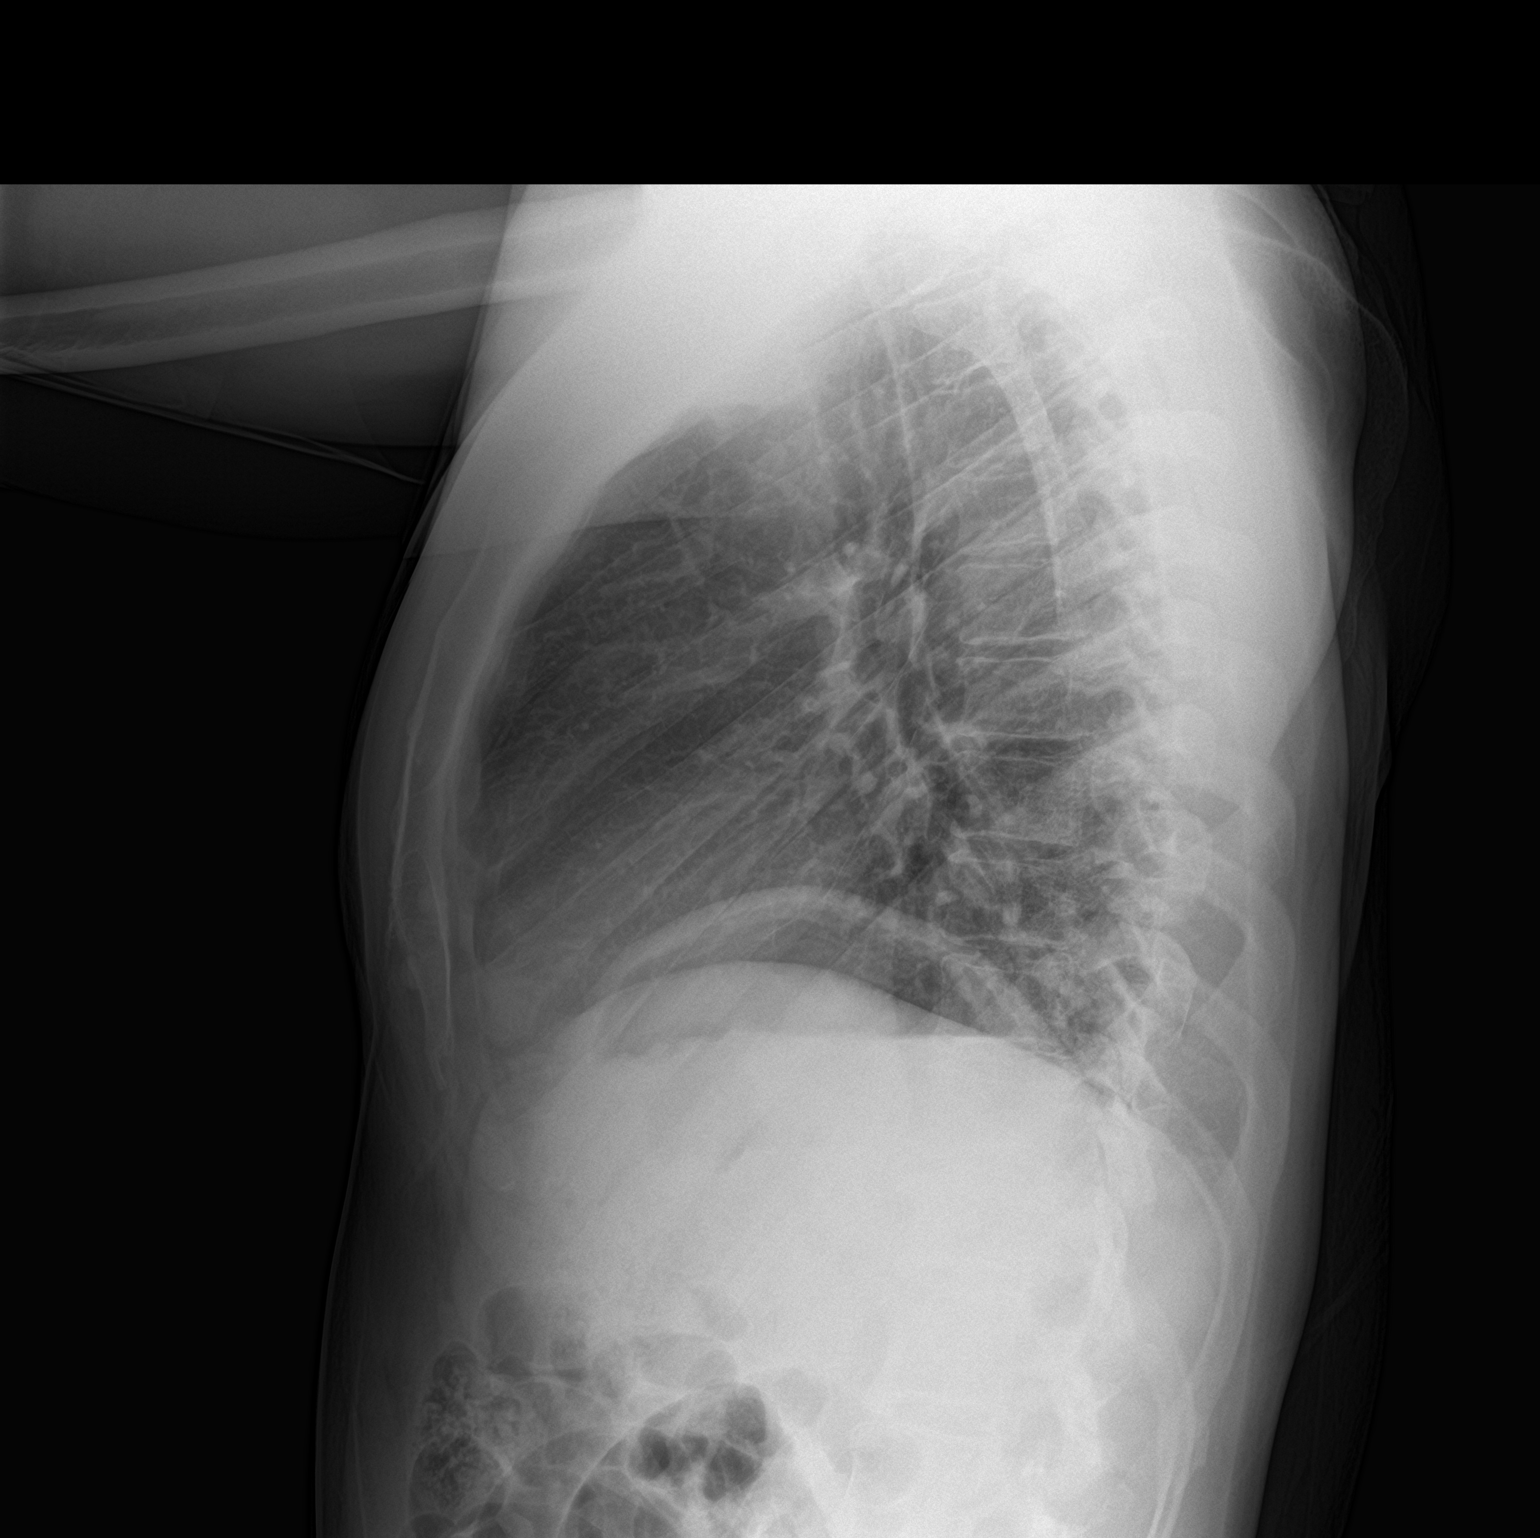

[2 of 2 positions shown; findings below may reference images not displayed]

FINDINGS: No consolidation, features of edema, pneumothorax, or effusion.
Pulmonary vascularity is normally distributed. The cardiomediastinal
contours are unremarkable. No acute osseous or soft tissue
abnormality.
IMPRESSION: No acute cardiopulmonary abnormality.

## 2021-09-25 ENCOUNTER — Emergency Department (HOSPITAL_COMMUNITY): Admission: EM | Admit: 2021-09-25 | Discharge: 2021-09-25 | Discharge status: Against Medical Advice | Payer: Self-pay
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
## 2022-01-06 ENCOUNTER — Emergency Department (HOSPITAL_COMMUNITY)
Admission: EM | Admit: 2022-01-06 | Discharge: 2022-01-06 | Disposition: A | Discharge status: Home / Self Care | Payer: Self-pay | Attending: Emergency Medicine | Admitting: Emergency Medicine

## 2022-01-06 ENCOUNTER — Emergency Department (HOSPITAL_COMMUNITY): Payer: Self-pay

## 2022-01-06 ENCOUNTER — Encounter (HOSPITAL_COMMUNITY): Payer: Self-pay

## 2022-01-06 ENCOUNTER — Other Ambulatory Visit: Payer: Self-pay

## 2022-01-06 DIAGNOSIS — T405X5A Adverse effect of cocaine, initial encounter: Secondary | ICD-10-CM | POA: Insufficient documentation

## 2022-01-06 DIAGNOSIS — R7989 Other specified abnormal findings of blood chemistry: Secondary | ICD-10-CM | POA: Insufficient documentation

## 2022-01-06 DIAGNOSIS — R079 Chest pain, unspecified: Secondary | ICD-10-CM | POA: Insufficient documentation

## 2022-01-06 DIAGNOSIS — R000 Tachycardia, unspecified: Secondary | ICD-10-CM | POA: Insufficient documentation

## 2022-01-06 LAB — BASIC METABOLIC PANEL
Anion gap: 16 — ABNORMAL HIGH (ref 5–15)
BUN: 9 mg/dL (ref 6–20)
CO2: 18 mmol/L — ABNORMAL LOW (ref 22–32)
Calcium: 8.4 mg/dL — ABNORMAL LOW (ref 8.9–10.3)
Chloride: 105 mmol/L (ref 98–111)
Creatinine, Ser: 1.35 mg/dL — ABNORMAL HIGH (ref 0.61–1.24)
GFR, Estimated: >60 mL/min (ref >60)
Glucose, Bld: 119 mg/dL — ABNORMAL HIGH (ref 70–99)
Potassium: 3.6 mmol/L (ref 3.5–5.1)
Sodium: 139 mmol/L (ref 135–145)

## 2022-01-06 LAB — CBC WITH DIFFERENTIAL/PLATELET
Abs Immature Granulocytes: 0.06 10*3/uL (ref 0.00–0.07)
Basophils Absolute: 0.1 10*3/uL (ref 0.0–0.1)
Basophils Relative: 1 %
Eosinophils Absolute: 0 10*3/uL (ref 0.0–0.5)
Eosinophils Relative: 0 %
HCT: 43.5 % (ref 39.0–52.0)
Hemoglobin: 14.8 g/dL (ref 13.0–17.0)
Immature Granulocytes: 1 %
Lymphocytes Relative: 23 %
Lymphs Abs: 1.5 10*3/uL (ref 0.7–4.0)
MCH: 30.3 pg (ref 26.0–34.0)
MCHC: 34 g/dL (ref 30.0–36.0)
MCV: 89.1 fL (ref 80.0–100.0)
Monocytes Absolute: 0.4 10*3/uL (ref 0.1–1.0)
Monocytes Relative: 6 %
Neutro Abs: 4.7 10*3/uL (ref 1.7–7.7)
Neutrophils Relative %: 69 %
Platelets: 214 10*3/uL (ref 150–400)
RBC: 4.88 MIL/uL (ref 4.22–5.81)
RDW: 13.2 % (ref 11.5–15.5)
WBC: 6.8 10*3/uL (ref 4.0–10.5)
nRBC: 0 % (ref 0.0–0.2)

## 2022-01-06 LAB — TROPONIN I (HIGH SENSITIVITY)
Troponin I (High Sensitivity): 8 ng/L (ref <18)
Troponin I (High Sensitivity): 19 ng/L — ABNORMAL HIGH (ref <18)

## 2022-01-06 LAB — RAPID URINE DRUG SCREEN, HOSP PERFORMED
Amphetamines: NOT DETECTED
Barbiturates: NOT DETECTED
Benzodiazepines: NOT DETECTED
Cocaine: POSITIVE — AB
Opiates: NOT DETECTED
Tetrahydrocannabinol: NOT DETECTED

## 2022-01-06 MED ORDER — LORAZEPAM 2 MG/ML IJ SOLN
2.0000 mg | Freq: Once | INTRAMUSCULAR | Status: AC
Start: 2022-01-06 — End: 2022-01-06
  Administered 2022-01-06: 2 mg via INTRAVENOUS
  Filled 2022-01-06: qty 1

## 2022-01-06 MED ORDER — SODIUM CHLORIDE 0.9 % IV BOLUS
1000.0000 mL | Freq: Once | INTRAVENOUS | Status: AC
Start: 2022-01-06 — End: 2022-01-06
  Administered 2022-01-06: 1000 mL via INTRAVENOUS
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
## 2022-01-06 NOTE — ED Provider Notes (Signed)
[facility] EMERGENCY DEPARTMENT Provider Note   CSN: [id number] Arrival date & time: 01/06/22  0109     History  Chief Complaint  Patient presents with   Chest Pain    Paul Lambert is a 46 y.o. male.  Presents to the emergency department for evaluation of chest pain, rapid heart rate after smoking marijuana.  Patient thinks that it might have been laced with something.       Home Medications Prior to Admission medications   Medication Sig Start Date End Date Taking? Authorizing Provider  amLODipine (NORVASC) 5 MG tablet Take 1 tablet (5 mg total) by mouth daily. 06/22/21 07/22/21  Massengill, Harrold Donath, MD  diphenhydrAMINE (BENADRYL ALLERGY) 25 mg capsule Take 1 capsule (25 mg total) by mouth every 6 (six) hours as needed. 06/16/21   Celedonio Savage, MD  famotidine (PEPCID) 20 MG tablet Take 1 tablet (20 mg total) by mouth 2 (two) times daily. Patient not taking: Reported on 06/14/2021 04/27/19   Bethann Berkshire, MD  gabapentin (NEURONTIN) 100 MG capsule Take 1 capsule (100 mg total) by mouth 2 (two) times daily. 06/21/21 07/21/21  Massengill, Harrold Donath, MD  gabapentin (NEURONTIN) 100 MG capsule Take 2 capsules (200 mg total) by mouth at bedtime. 06/21/21 07/21/21  Massengill, Harrold Donath, MD  hydrochlorothiazide (HYDRODIURIL) 12.5 MG tablet Take 1 tablet (12.5 mg total) by mouth daily. 06/22/21 07/22/21  Massengill, Harrold Donath, MD  loratadine (CLARITIN REDITABS) 10 MG dissolvable tablet Take 1 tablet (10 mg total) by mouth daily. As needed for allergy symptoms 06/16/21   Celedonio Savage, MD  pantoprazole (PROTONIX) 40 MG tablet Take 1 tablet (40 mg total) by mouth daily. 06/22/21 07/22/21  Massengill, Harrold Donath, MD  sertraline (ZOLOFT) 100 MG tablet Take 1 tablet (100 mg total) by mouth daily. 06/22/21 07/22/21  Massengill, Harrold Donath, MD  traZODone (DESYREL) 100 MG tablet Take 1 tablet (100 mg total) by mouth at bedtime as needed for sleep. 06/21/21 07/21/21  Massengill, Harrold Donath, MD      Allergies    Ace  inhibitors and Onion    Review of Systems   Review of Systems  Physical Exam Updated Vital Signs BP 135/85   Pulse (!) 106   Temp 97.6 F (36.4 C)   Resp (!) 23   Ht 5\' 9"  (1.753 m)   Wt 79 kg   SpO2 100%   BMI 25.72 kg/m  Physical Exam Vitals and nursing note reviewed.  Constitutional:      General: He is not in acute distress.    Appearance: He is well-developed.  HENT:     Head: Normocephalic and atraumatic.     Mouth/Throat:     Mouth: Mucous membranes are moist.  Eyes:     General: Vision grossly intact. Gaze aligned appropriately.     Extraocular Movements: Extraocular movements intact.     Conjunctiva/sclera: Conjunctivae normal.  Cardiovascular:     Rate and Rhythm: Regular rhythm. Tachycardia present.     Pulses: Normal pulses.     Heart sounds: Normal heart sounds, S1 normal and S2 normal. No murmur heard.    No friction rub. No gallop.  Pulmonary:     Effort: Pulmonary effort is normal. No respiratory distress.     Breath sounds: Normal breath sounds.  Abdominal:     Palpations: Abdomen is soft.     Tenderness: There is no abdominal tenderness. There is no guarding or rebound.     Hernia: No hernia is present.  Musculoskeletal:  General: No swelling.     Cervical back: Full passive range of motion without pain, normal range of motion and neck supple. No pain with movement, spinous process tenderness or muscular tenderness. Normal range of motion.     Right lower leg: No edema.     Left lower leg: No edema.  Skin:    General: Skin is warm and dry.     Capillary Refill: Capillary refill takes less than 2 seconds.     Findings: No ecchymosis, erythema, lesion or wound.  Neurological:     Mental Status: He is alert and oriented to person, place, and time.     GCS: GCS eye subscore is 4. GCS verbal subscore is 5. GCS motor subscore is 6.     Cranial Nerves: Cranial nerves 2-12 are intact.     Sensory: Sensation is intact.     Motor: Motor function  is intact. No weakness or abnormal muscle tone.     Coordination: Coordination is intact.  Psychiatric:        Mood and Affect: Mood normal.        Speech: Speech normal.        Behavior: Behavior normal.     ED Results / Procedures / Treatments   Labs (all labs ordered are listed, but only abnormal results are displayed) Labs Reviewed  BASIC METABOLIC PANEL - Abnormal; Notable for the following components:      Result Value   CO2 18 (*)    Glucose, Bld 119 (*)    Creatinine, Ser 1.35 (*)    Calcium 8.4 (*)    Anion gap 16 (*)    All other components within normal limits  RAPID URINE DRUG SCREEN, HOSP PERFORMED - Abnormal; Notable for the following components:   Cocaine POSITIVE (*)    All other components within normal limits  CBC WITH DIFFERENTIAL/PLATELET  TROPONIN I (HIGH SENSITIVITY)  TROPONIN I (HIGH SENSITIVITY)    EKG EKG Interpretation  Date/Time:  Thursday January 06 2022 01:18:30 EST Ventricular Rate:  152 PR Interval:  114 QRS Duration: 98 QT Interval:  328 QTC Calculation: 522 R Axis:   64 Text Interpretation: Sinus tachycardia Probable left atrial enlargement Nonspecific T abnormalities, lateral leads Prolonged QT interval Confirmed by Gilda Crease [phone]) on 01/06/2022 1:27:46 AM   EKG Interpretation  Date/Time:  Thursday January 06 2022 05:48:14 EST Ventricular Rate:  101 PR Interval:  149 QRS Duration: 75 QT Interval:  374 QTC Calculation: 485 R Axis:   74 Text Interpretation: Sinus tachycardia Borderline prolonged QT interval Baseline wander in lead(s) V6 No acute changes Confirmed by Gilda Crease ([id number]) on 01/06/2022 5:53:49 AM         Radiology DG Chest Port 1 View  Result Date: 01/06/2022 CLINICAL DATA:  Chest pain EXAM: PORTABLE CHEST 1 VIEW COMPARISON:  09/12/2020 FINDINGS: The heart size and mediastinal contours are within normal limits. Both lungs are clear. The visualized skeletal structures are unremarkable.  IMPRESSION: No active disease. Electronically Signed   By: Minerva Fester M.D.   On: 01/06/2022 01:26    Procedures Procedures    Medications Ordered in ED Medications  sodium chloride 0.9 % bolus 1,000 mL (1,000 mLs Intravenous New Bag/Given 01/06/22 0132)  LORazepam (ATIVAN) injection 2 mg (2 mg Intravenous Given 01/06/22 0132)    ED Course/ Medical Decision Making/ A&P  Medical Decision Making Amount and/or Complexity of Data Reviewed Labs: ordered. Radiology: ordered.  Risk Prescription drug management.   Patient presents to the emergency department with chest pain, shortness of breath, anxiety after smoking "a blunt".  Patient thought it was marijuana but thinks it might have been laced with something.  EKG at arrival does not show ischemia or infarct.  First troponin is negative.  Second troponin slightly elevated at 19.  Patient very tachycardic at arrival, this is improving with IV fluids and Ativan.  Patient much more calm now.  He is currently pain-free.  He is at his baseline without any symptoms.  Repeat EKG now that he is rate controlled is without ischemic changes.  Identical to prior EKGs.  Drug screen positive for cocaine.  Presentation consistent with cocaine effect.  No further work-up necessary, will be appropriate for discharge.  Will refer to cardiology for outpatient follow-up.        Final Clinical Impression(s) / ED Diagnoses Final diagnoses:  Cocaine adverse reaction, initial encounter    Rx / DC Orders ED Discharge Orders     None         Letitia Sabala, Canary Brim, MD 01/06/22 [phone]

## 2022-01-07 ENCOUNTER — Emergency Department (HOSPITAL_COMMUNITY): Payer: Self-pay

## 2022-01-07 ENCOUNTER — Other Ambulatory Visit: Payer: Self-pay

## 2022-01-07 ENCOUNTER — Emergency Department (HOSPITAL_COMMUNITY)
Admission: EM | Admit: 2022-01-07 | Discharge: 2022-01-07 | Disposition: A | Discharge status: Home / Self Care | Payer: Self-pay | Attending: Emergency Medicine | Admitting: Emergency Medicine

## 2022-01-07 ENCOUNTER — Encounter (HOSPITAL_COMMUNITY): Payer: Self-pay | Admitting: Emergency Medicine

## 2022-01-07 DIAGNOSIS — F17210 Nicotine dependence, cigarettes, uncomplicated: Secondary | ICD-10-CM | POA: Insufficient documentation

## 2022-01-07 DIAGNOSIS — R079 Chest pain, unspecified: Secondary | ICD-10-CM | POA: Insufficient documentation

## 2022-01-07 DIAGNOSIS — I10 Essential (primary) hypertension: Secondary | ICD-10-CM | POA: Insufficient documentation

## 2022-01-07 DIAGNOSIS — F1490 Cocaine use, unspecified, uncomplicated: Secondary | ICD-10-CM | POA: Insufficient documentation

## 2022-01-07 HISTORY — DX: Cocaine abuse, uncomplicated: F14.10

## 2022-01-07 HISTORY — DX: Other psychoactive substance abuse, uncomplicated: F19.10

## 2022-01-07 LAB — COMPREHENSIVE METABOLIC PANEL
ALT: 21 U/L (ref 0–44)
AST: 40 U/L (ref 15–41)
Albumin: 4.6 g/dL (ref 3.5–5.0)
Alkaline Phosphatase: 51 U/L (ref 38–126)
Anion gap: 20 — ABNORMAL HIGH (ref 5–15)
BUN: 8 mg/dL (ref 6–20)
CO2: 14 mmol/L — ABNORMAL LOW (ref 22–32)
Calcium: 8.5 mg/dL — ABNORMAL LOW (ref 8.9–10.3)
Chloride: 107 mmol/L (ref 98–111)
Creatinine, Ser: 1.51 mg/dL — ABNORMAL HIGH (ref 0.61–1.24)
GFR, Estimated: 57 mL/min — ABNORMAL LOW (ref >60)
Glucose, Bld: 104 mg/dL — ABNORMAL HIGH (ref 70–99)
Potassium: 4.1 mmol/L (ref 3.5–5.1)
Sodium: 141 mmol/L (ref 135–145)
Total Bilirubin: 0.9 mg/dL (ref 0.3–1.2)
Total Protein: 7.8 g/dL (ref 6.5–8.1)

## 2022-01-07 LAB — CBC
HCT: 45.7 % (ref 39.0–52.0)
Hemoglobin: 15.4 g/dL (ref 13.0–17.0)
MCH: 30.2 pg (ref 26.0–34.0)
MCHC: 33.7 g/dL (ref 30.0–36.0)
MCV: 89.6 fL (ref 80.0–100.0)
Platelets: 228 10*3/uL (ref 150–400)
RBC: 5.1 MIL/uL (ref 4.22–5.81)
RDW: 13.4 % (ref 11.5–15.5)
WBC: 7.7 10*3/uL (ref 4.0–10.5)
nRBC: 0 % (ref 0.0–0.2)

## 2022-01-07 LAB — TROPONIN I (HIGH SENSITIVITY): Troponin I (High Sensitivity): 8 ng/L (ref <18)

## 2022-01-07 MED ORDER — SODIUM CHLORIDE 0.9 % IV BOLUS
1000.0000 mL | Freq: Once | INTRAVENOUS | Status: AC
  Administered 2022-01-07: 1000 mL via INTRAVENOUS

## 2022-01-07 MED ORDER — LORAZEPAM 2 MG/ML IJ SOLN
1.0000 mg | Freq: Once | INTRAMUSCULAR | Status: AC
  Administered 2022-01-07: 1 mg via INTRAVENOUS
  Filled 2022-01-07: qty 1

## 2022-01-07 NOTE — Discharge Instructions (Signed)
You were evaluated in the Emergency Department and after careful evaluation, we did not find any emergent condition requiring admission or further testing in the hospital.  Your exam/testing today was overall reassuring.  Important that you refrain from crack or cocaine use in the future as we discussed.  Please return to the Emergency Department if you experience any worsening of your condition.  Thank you for allowing [place] to be a part of your care.

## 2022-01-07 NOTE — ED Notes (Signed)
Pt ambulatory to restroom with no difficulty or distress noted. 

## 2022-01-07 NOTE — ED Provider Notes (Signed)
[facility] Emergency Department Provider Note MRN:  [id number]  Arrival date & time: 01/07/22     Chief Complaint   Ingestion   History of Present Illness   Paul Lambert is a 46 y.o. year-old male with a history of substance use to disorder presenting to the ED with chief complaint of ingestion.  Patient smoked some crack cocaine this evening, now experiencing chest pain, malaise, paranoid.  No shortness of breath.  Review of Systems  A thorough review of systems was obtained and all systems are negative except as noted in the HPI and PMH.   Patient's Health History    Past Medical History:  Diagnosis Date   Cocaine abuse ([place])    ETOH abuse    Hypertension    Seizures ([place])    Substance abuse ([place])     Past Surgical History:  Procedure Laterality Date   FACIAL COSMETIC SURGERY      Family History  Problem Relation Age of Onset   Hypertension Mother    Hypertension Father     Social History   Socioeconomic History   Marital status: Single    Spouse name: Not on file   Number of children: Not on file   Years of education: Not on file   Highest education level: Not on file  Occupational History   Not on file  Tobacco Use   Smoking status: Every Day    Packs/day: 0.50    Types: Cigarettes   Smokeless tobacco: Never  Vaping Use   Vaping Use: Never used  Substance and Sexual Activity   Alcohol use: Yes    Alcohol/week: 40.0 - 52.0 standard drinks of alcohol    Types: 24 - 36 Cans of beer, 16 Shots of liquor per week    Comment: last drink yesterday   Drug use: Not Currently    Frequency: 7.0 times per week    Types: Marijuana, "Crack" cocaine    Comment: "I last used on 06/13/21 before I went to the hospital"   Sexual activity: Not on file  Other Topics Concern   Not on file  Social History Narrative   Pt is a 46 y/o [place] American male with history of polysubstance abuse (cocaine, etoh & marijuana). Currently homeless and  without a job.   Social Determinants of Health   Financial Resource Strain: Not on file  Food Insecurity: Not on file  Transportation Needs: Not on file  Physical Activity: Not on file  Stress: Not on file  Social Connections: Not on file  Intimate Partner Violence: Not on file     Physical Exam   Vitals:   01/07/22 0622 01/07/22 0630  BP: (!) 133/92 (!) 134/94  Pulse: 95 90  Resp:  18  Temp:  98.1 F (36.7 C)  SpO2: 97% 98%    CONSTITUTIONAL: Chronically ill-appearing, NAD NEURO/PSYCH:  Alert and oriented x 3, no focal deficits EYES:  eyes equal and reactive ENT/NECK:  no LAD, no JVD CARDIO: Tachycardic rate, well-perfused, normal S1 and S2 PULM:  CTAB no wheezing or rhonchi GI/GU:  non-distended, non-tender MSK/SPINE:  No gross deformities, no edema SKIN:  no rash, atraumatic   *Additional and/or pertinent findings included in MDM below  Diagnostic and Interventional Summary    EKG Interpretation  Date/Time:  Friday January 07 2022 00:56:35 EST Ventricular Rate:  144 PR Interval:  106 QRS Duration: 90 QT Interval:  296 QTC Calculation: 459 R Axis:   59 Text Interpretation: Sinus  tachycardia Paired ventricular premature complexes Aberrant complex Probable left atrial enlargement Borderline T wave abnormalities Confirmed by Gerlene Fee [phone]) on 01/07/2022 1:14:20 AM       Labs Reviewed  COMPREHENSIVE METABOLIC PANEL - Abnormal; Notable for the following components:      Result Value   CO2 14 (*)    Glucose, Bld 104 (*)    Creatinine, Ser 1.51 (*)    Calcium 8.5 (*)    GFR, Estimated 57 (*)    Anion gap 20 (*)    All other components within normal limits  CBC  TROPONIN I (HIGH SENSITIVITY)    DG Chest Port 1 View  Final Result      Medications  LORazepam (ATIVAN) injection 1 mg (1 mg Intravenous Given 01/07/22 0135)  sodium chloride 0.9 % bolus 1,000 mL (0 mLs Intravenous Stopped 01/07/22 0225)  sodium chloride 0.9 % bolus 1,000 mL (0 mLs  Intravenous Stopped 01/07/22 0533)     Procedures  /  Critical Care .Critical Care  Performed by: Maudie Flakes, MD Authorized by: Maudie Flakes, MD   Critical care provider statement:    Critical care time (minutes):  30   Critical care was necessary to treat or prevent imminent or life-threatening deterioration of the following conditions:  Toxidrome   Critical care was time spent personally by me on the following activities:  Development of treatment plan with patient or surrogate, discussions with consultants, evaluation of patient's response to treatment, examination of patient, ordering and review of laboratory studies, ordering and review of radiographic studies, ordering and performing treatments and interventions, pulse oximetry, re-evaluation of patient's condition and review of old charts   ED Course and Medical Decision Making  Initial Impression and Ddx Patient presenting with sympathomimetic toxidrome from crack cocaine use.  Heart rate in the 140s, having chest discomfort, does not seem to be in acute distress, not clutching his chest, sitting comfortably.  Coronary vasospasm is considered, dissection is of course a consideration but felt to be less likely, awaiting labs, chest x-ray.  Providing Ativan for the symptoms/heart rate.  Past medical/surgical history that increases complexity of ED encounter: Substance use disorder  Interpretation of Diagnostics I personally reviewed the EKG and my interpretation is as follows: Sinus tachycardia  Labs reassuring with no significant blood count or electrolyte disturbance, troponin negative  Patient Reassessment and Ultimate Disposition/Management     Patient allowed to metabolize in the emergency department for 6 hours, had resolution of tachycardia and symptoms.  Feels a lot better, now is appropriate for discharge.  Patient management required discussion with the following services or consulting groups:  None  Complexity  of Problems Addressed Acute illness or injury that poses threat of life of bodily function  Additional Data Reviewed and Analyzed Further history obtained from: Past medical history and medications listed in the EMR, Prior ED visit notes, and Prior labs/imaging results  Additional Factors Impacting ED Encounter Risk None  Barth Kirks. Sedonia Small, [place] [email or web address]  Final Clinical Impressions(s) / ED Diagnoses     ICD-10-CM   1. Cocaine use  F14.90     2. Chest pain, unspecified type  R07.9       ED Discharge Orders     None        Discharge Instructions Discussed with and Provided to Patient:     Discharge Instructions      You were evaluated in the Emergency Department and after  careful evaluation, we did not find any emergent condition requiring admission or further testing in the hospital.  Your exam/testing today was overall reassuring.  Important that you refrain from crack or cocaine use in the future as we discussed.  Please return to the Emergency Department if you experience any worsening of your condition.  Thank you for allowing [place] to be a part of your care.        Sabas Sous, MD 01/07/22 [phone]

## 2022-01-07 NOTE — ED Triage Notes (Signed)
[facility]. Pt admits to smoking crack tonight. Pt seen for same last night. Pt c/o continued chest pain.

## 2022-01-22 ENCOUNTER — Emergency Department (HOSPITAL_COMMUNITY)
Admission: EM | Admit: 2022-01-22 | Discharge: 2022-01-22 | Disposition: A | Discharge status: Home / Self Care | Payer: Self-pay | Attending: Emergency Medicine | Admitting: Emergency Medicine

## 2022-01-22 ENCOUNTER — Encounter (HOSPITAL_COMMUNITY): Payer: Self-pay

## 2022-01-22 ENCOUNTER — Other Ambulatory Visit: Payer: Self-pay

## 2022-01-22 DIAGNOSIS — F32A Depression, unspecified: Secondary | ICD-10-CM | POA: Insufficient documentation

## 2022-01-22 DIAGNOSIS — F1410 Cocaine abuse, uncomplicated: Secondary | ICD-10-CM | POA: Insufficient documentation

## 2022-01-22 DIAGNOSIS — F1010 Alcohol abuse, uncomplicated: Secondary | ICD-10-CM

## 2022-01-22 LAB — COMPREHENSIVE METABOLIC PANEL
ALT: 33 U/L (ref 0–44)
AST: 25 U/L (ref 15–41)
Albumin: 4.4 g/dL (ref 3.5–5.0)
Alkaline Phosphatase: 54 U/L (ref 38–126)
Anion gap: 10 (ref 5–15)
BUN: 7 mg/dL (ref 6–20)
CO2: 21 mmol/L — ABNORMAL LOW (ref 22–32)
Calcium: 8.4 mg/dL — ABNORMAL LOW (ref 8.9–10.3)
Chloride: 104 mmol/L (ref 98–111)
Creatinine, Ser: 1.04 mg/dL (ref 0.61–1.24)
GFR, Estimated: >60 mL/min (ref >60)
Glucose, Bld: 106 mg/dL — ABNORMAL HIGH (ref 70–99)
Potassium: 4 mmol/L (ref 3.5–5.1)
Sodium: 135 mmol/L (ref 135–145)
Total Bilirubin: 1.1 mg/dL (ref 0.3–1.2)
Total Protein: 7.8 g/dL (ref 6.5–8.1)

## 2022-01-22 LAB — CBC WITH DIFFERENTIAL/PLATELET
Abs Immature Granulocytes: 0.02 10*3/uL (ref 0.00–0.07)
Basophils Absolute: 0.1 10*3/uL (ref 0.0–0.1)
Basophils Relative: 1 %
Eosinophils Absolute: 0 10*3/uL (ref 0.0–0.5)
Eosinophils Relative: 0 %
HCT: 43.1 % (ref 39.0–52.0)
Hemoglobin: 15 g/dL (ref 13.0–17.0)
Immature Granulocytes: 0 %
Lymphocytes Relative: 20 %
Lymphs Abs: 1.6 10*3/uL (ref 0.7–4.0)
MCH: 31 pg (ref 26.0–34.0)
MCHC: 34.8 g/dL (ref 30.0–36.0)
MCV: 89 fL (ref 80.0–100.0)
Monocytes Absolute: 0.6 10*3/uL (ref 0.1–1.0)
Monocytes Relative: 7 %
Neutro Abs: 5.9 10*3/uL (ref 1.7–7.7)
Neutrophils Relative %: 72 %
Platelets: 280 10*3/uL (ref 150–400)
RBC: 4.84 MIL/uL (ref 4.22–5.81)
RDW: 13.5 % (ref 11.5–15.5)
WBC: 8.2 10*3/uL (ref 4.0–10.5)
nRBC: 0 % (ref 0.0–0.2)

## 2022-01-22 LAB — RAPID URINE DRUG SCREEN, HOSP PERFORMED
Amphetamines: NOT DETECTED
Barbiturates: NOT DETECTED
Benzodiazepines: NOT DETECTED
Cocaine: POSITIVE — AB
Opiates: NOT DETECTED
Tetrahydrocannabinol: NOT DETECTED

## 2022-01-22 LAB — ETHANOL: Alcohol, Ethyl (B): 49 mg/dL — ABNORMAL HIGH (ref <10)

## 2022-01-22 MED ORDER — THIAMINE HCL 100 MG/ML IJ SOLN: 100.0000 mg | Freq: Every day | INTRAMUSCULAR | Status: DC

## 2022-01-22 MED ORDER — LORAZEPAM 1 MG PO TABS: 0.0000 mg | ORAL_TABLET | Freq: Two times a day (BID) | ORAL | Status: DC

## 2022-01-22 MED ORDER — LORAZEPAM 2 MG/ML IJ SOLN
2.0000 mg | Freq: Once | INTRAMUSCULAR | Status: AC
  Administered 2022-01-22: 2 mg via INTRAVENOUS
  Filled 2022-01-22: qty 1

## 2022-01-22 MED ORDER — LORAZEPAM 2 MG/ML IJ SOLN: 0.0000 mg | Freq: Four times a day (QID) | INTRAMUSCULAR | Status: DC

## 2022-01-22 MED ORDER — THIAMINE MONONITRATE 100 MG PO TABS
100.0000 mg | ORAL_TABLET | Freq: Every day | ORAL | Status: DC
  Administered 2022-01-22: 100 mg via ORAL
  Filled 2022-01-22: qty 1

## 2022-01-22 MED ORDER — LORAZEPAM 1 MG PO TABS: 0.0000 mg | ORAL_TABLET | Freq: Four times a day (QID) | ORAL | Status: DC

## 2022-01-22 MED ORDER — LORAZEPAM 2 MG/ML IJ SOLN: 0.0000 mg | Freq: Two times a day (BID) | INTRAMUSCULAR | Status: DC

## 2022-01-22 MED ORDER — SODIUM CHLORIDE 0.9 % IV BOLUS
1000.0000 mL | Freq: Once | INTRAVENOUS | Status: AC
  Administered 2022-01-22: 1000 mL via INTRAVENOUS

## 2022-01-22 NOTE — ED Notes (Signed)
Breakfast tray given. °

## 2022-01-22 NOTE — BH Assessment (Addendum)
Comprehensive Clinical Assessment (CCA) Note  01/22/2022 Paul Lambert [id number]  Disposition: Roselyn Bering, NP, psych cleared. TTS clinician faxed outpatient resources for follow up. Claris Gower, RN, informed of disposition.   The patient demonstrates the following risk factors for suicide: Chronic risk factors for suicide include: psychiatric disorder of depression and substance use disorder. Acute risk factors for suicide include: family or marital conflict and unemployment. Protective factors for this patient include: positive therapeutic relationship, responsibility to others (children, family), and coping skills. Considering these factors, the overall suicide risk at this point appears to be low. Patient is appropriate for outpatient follow up.  Paul Lambert is a 46 year old male presenting voluntary to [facility] due to alcohol and crack cocaine usage. Patient denied SI, HI and psychosis. Patient brought in by EMS after going to [facility] station stating he wants to stop using crack and alcohol. Patient reported main stressors include, feeling like family doesn't pay him any attention or talk to him. Patient also reported main stressor is his relationship with his wife of 2 months. Patient reported worsening depressive symptoms. Patient reported psych inpatient treatment 06/2021 at [facility]. Patient denied prior suicide attempts and self-harming behaviors. Patient reported poor sleep and normal appetite.   Patient denied receiving any outpatient mental health services. Patient denied being prescribed any psych medications.   Patient currently resides with parents. Patient has been married for 2 months and is experiencing marital discord. Patient has a 46 year old son. Patient is currently employed at the [employer], no work related stressors reported. Patient denied access to guns. Patient was calm and cooperative during assessment. Patient feels that he will keep using if he doesn't get help.    Chief Complaint:  Chief Complaint  Patient presents with   detox from ETOH and crack   Visit Diagnosis:  Alcohol dependence    CCA Screening, Triage and Referral (STR)  Patient Reported Information How did you hear about [place]? Self  What Is the Reason for Your Visit/Call Today? alcohol and crack cocaine addiction  How Long Has This Been Causing You Problems? > than 6 months  What Do You Feel Would Help You the Most Today? Alcohol or Drug Use Treatment   Have You Recently Had Any Thoughts About Hurting Yourself? No  Are You Planning to Commit Suicide/Harm Yourself At This time? No   Flowsheet Row ED from 01/22/2022 in [facility] EMERGENCY DEPARTMENT ED from 01/07/2022 in [facility] EMERGENCY DEPARTMENT ED from 01/06/2022 in [facility] EMERGENCY DEPARTMENT  C-SSRS RISK CATEGORY No Risk No Risk No Risk       Have you Recently Had Thoughts About Hurting Someone Paul Lambert? No  Are You Planning to Harm Someone at This Time? No  Explanation: n/a   Have You Used Any Alcohol or Drugs in the Past 24 Hours? Yes  What Did You Use and How Much? "4 beers and 5 pieces of crack"   Do You Currently Have a Therapist/Psychiatrist? No  Name of Therapist/Psychiatrist: Name of Therapist/Psychiatrist: n/a   Have You Been Recently Discharged From Any Office Practice or Programs? No  Explanation of Discharge From Practice/Program: n/a     CCA Screening Triage Referral Assessment Type of Contact: Tele-Assessment  Telemedicine Service Delivery:   Is this Initial or Reassessment? Is this Initial or Reassessment?: Initial Assessment  Date Telepsych consult ordered in [facility]:  Date Telepsych consult ordered in [facility]: 01/22/22  Time Telepsych consult ordered in [facility]:  Time Telepsych consult ordered in [facility]:  0320  Location of Assessment: [facility]  Provider Location: [facility] Assessment Services   Collateral Involvement: none reported   Does Patient Have a Court Appointed Legal Guardian?  No  Legal Guardian Contact Information: n/a  Copy of Legal Guardianship Form: -- (n/a)  Legal Guardian Notified of Arrival: -- (n/a)  Legal Guardian Notified of Pending Discharge: -- (n/a)  If Minor and Not Living with Parent(s), Who has Custody? n/a  Is CPS involved or ever been involved? Never  Is APS involved or ever been involved? Never   Patient Determined To Be At Risk for Harm To Self or Others Based on Review of Patient Reported Information or Presenting Complaint? No  Method: -- (n/a)  Availability of Means: -- (n/a)  Intent: -- (n/a)  Notification Required: -- (n/a)  Additional Information for Danger to Others Potential: -- (n/a)  Additional Comments for Danger to Others Potential: n/a  Are There Guns or Other Weapons in Your Home? No  Types of Guns/Weapons: n/a  Are These Weapons Safely Secured?                            -- (n/a)  Who Could Verify You Are Able To Have These Secured: n/a  Do You Have any Outstanding Charges, Pending Court Dates, Parole/Probation? none reported  Contacted To Inform of Risk of Harm To Self or Others: -- (n/a)    Does Patient Present under Involuntary Commitment? No    [place] of Residence: [place]   Patient Currently Receiving the Following Services: Not Receiving Services   Determination of Need: Routine (7 days)   Options For Referral: Chemical Dependency Intensive Outpatient Therapy (CDIOP)     CCA Biopsychosocial Patient Reported Schizophrenia/Schizoaffective Diagnosis in Past: No   Strengths: Pt is requesting alcohol and drug treatment.   Mental Health Symptoms Depression:   Fatigue; Hopelessness; Worthlessness; Sleep (too much or little); Increase/decrease in appetite   Duration of Depressive symptoms:  Duration of Depressive Symptoms: -- (uta)   Mania:   None   Anxiety:    Tension; Worrying; Sleep   Psychosis:   None   Duration of Psychotic symptoms:  Duration of Psychotic  Symptoms: N/A   Trauma:   None   Obsessions:   None   Compulsions:   None   Inattention:   None   Hyperactivity/Impulsivity:   N/A   Oppositional/Defiant Behaviors:   N/A   Emotional Irregularity:   N/A   Other Mood/Personality Symptoms:   NA    Mental Status Exam Appearance and self-care  Stature:   Average   Weight:   Average weight   Clothing:   Age-appropriate   Grooming:   Normal   Cosmetic use:   None   Posture/gait:   Normal   Motor activity:   Not Remarkable   Sensorium  Attention:   Normal   Concentration:   Normal   Orientation:   X5   Recall/memory:   Normal   Affect and Mood  Affect:   Appropriate   Mood:   Depressed   Relating  Eye contact:   Normal   Facial expression:   Responsive; Sad   Attitude toward examiner:   Cooperative   Thought and Language  Speech flow:  Normal   Thought content:   Appropriate to Mood and Circumstances   Preoccupation:   None   Hallucinations:   None   Organization:   [employer]  of Knowledge:   Fair   Intelligence:   Average   Abstraction:   Normal   Judgement:   Impaired   Reality Testing:   Adequate   Insight:   Fair   Decision Making:   Normal   Social Functioning  Social Maturity:   Irresponsible   Social Judgement:   Normal   Stress  Stressors:   Housing; Surveyor, quantity; Relationship; Family conflict   Coping Ability:   Exhausted; Overwhelmed   Skill Deficits:   None   Supports:   Support needed; Friends/Service system     Religion: Religion/Spirituality Are You A Religious Person?: No How Might This Affect Treatment?: NA  Leisure/Recreation: Leisure / Recreation Do You Have Hobbies?: Yes Leisure and Hobbies: spending time with son and wife  Exercise/Diet: Exercise/Diet Do You Exercise?: No Have You Gained or Lost A Significant Amount of Weight in the Past Six Months?: No Do You Follow a Special  Diet?: No Do You Have Any Trouble Sleeping?: Yes Explanation of Sleeping Difficulties: "no sleep"   CCA Employment/Education Employment/Work Situation: Employment / Work Situation Employment Situation: Employed Work Stressors: none reported Patient's Job has Been Impacted by Current Illness: Yes Describe how Patient's Job has Been Impacted: uta Has Patient ever Been in the [employer]?: No  Education: Education Is Patient Currently Attending School?: No Last Grade Completed: 10 Did You Product manager?: No Did You Have An Individualized Education Program (IIEP): No Did You Have Any Difficulty At [employer]?: No Patient's Education Has Been Impacted by Current Illness:  (uta)   CCA Family/Childhood History Family and Relationship History: Family history Marital status: Married Number of Years Married: 0.12 (2 months) What types of issues is patient dealing with in the relationship?: financial, responsibility Additional relationship information: n/a Does patient have children?: Yes How many children?: 1 How is patient's relationship with their children?: good  Childhood History:  Childhood History By whom was/is the patient raised?: Both parents Did patient suffer any verbal/emotional/physical/sexual abuse as a child?: No Did patient suffer from severe childhood neglect?: No Has patient ever been sexually abused/assaulted/raped as an adolescent or adult?: No Was the patient ever a victim of a crime or a disaster?: No Witnessed domestic violence?: No Has patient been affected by domestic violence as an adult?: No       CCA Substance Use Alcohol/Drug Use:                           ASAM's:  Six Dimensions of Multidimensional Assessment  Dimension 1:  Acute Intoxication and/or Withdrawal Potential:      Dimension 2:  Biomedical Conditions and Complications:      Dimension 3:  Emotional, Behavioral, or Cognitive Conditions and Complications:     Dimension 4:   Readiness to Change:     Dimension 5:  Relapse, Continued use, or Continued Problem Potential:     Dimension 6:  Recovery/Living Environment:     ASAM Severity Score:    ASAM Recommended Level of Treatment:     Substance use Disorder (SUD)    Recommendations for Services/Supports/Treatments:    Discharge Disposition:    DSM5 Diagnoses: Patient Active Problem List   Diagnosis Date Noted   Nicotine dependence 06/16/2021   Angioedema of lips 06/16/2021   MDD (major depressive disorder), recurrent, severe, 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   Severe recurrent major depression with psychotic features (HCC) 06/15/2021   AKI (acute kidney injury) (HCC) 11/20/2017   Hypertension 11/20/2017  Polycythemia 11/20/2017   Alcohol intoxication with delirium (HCC) 11/20/2017   Rhabdomyolysis 11/20/2017   Seizures (HCC) 11/20/2017   Hypokalemia 11/20/2017   Alcohol dependence (HCC) 11/20/2017   Cocaine use 11/20/2017   Alcoholic ketoacidosis 11/20/2017     Referrals to Alternative Service(s): Referred to Alternative Service(s):   Place:   Date:   Time:    Referred to Alternative Service(s):   Place:   Date:   Time:    Referred to Alternative Service(s):   Place:   Date:   Time:    Referred to Alternative Service(s):   Place:   Date:   Time:     Burnetta SabinLatisha D Foye Haggart, [facility]

## 2022-01-22 NOTE — ED Triage Notes (Signed)
Pt brought in by EMS, was picked up at [facility] station after he told police he needed help, pt states he "wants help to stop using crack and ETOH. Pt reports stressors in life and feeling like family doesn't pay attention or talk to him. Pt alert and oriented, denies SI/HI. Pt cooperative at this time.

## 2022-01-22 NOTE — ED Provider Notes (Signed)
[facility] EMERGENCY DEPARTMENT Provider Note   CSN: [id number] Arrival date & time: 01/22/22  0050     History  Chief Complaint  Patient presents with   detox from ETOH and crack    Paul Lambert is a 46 y.o. male.  Patient presents to the emergency department asking for help with alcohol and crack.  Patient reports that he is losing his family, does not have any support system.  He is not actively homicidal or suicidal, but thinks the drugs are going to kill him.       Home Medications Prior to Admission medications   Medication Sig Start Date End Date Taking? Authorizing Provider  amLODipine (NORVASC) 5 MG tablet Take 1 tablet (5 mg total) by mouth daily. 06/22/21 07/22/21  Massengill, Harrold Donath, MD  diphenhydrAMINE (BENADRYL ALLERGY) 25 mg capsule Take 1 capsule (25 mg total) by mouth every 6 (six) hours as needed. 06/16/21   Celedonio Savage, MD  famotidine (PEPCID) 20 MG tablet Take 1 tablet (20 mg total) by mouth 2 (two) times daily. Patient not taking: Reported on 06/14/2021 04/27/19   Bethann Berkshire, MD  gabapentin (NEURONTIN) 100 MG capsule Take 1 capsule (100 mg total) by mouth 2 (two) times daily. 06/21/21 07/21/21  Massengill, Harrold Donath, MD  gabapentin (NEURONTIN) 100 MG capsule Take 2 capsules (200 mg total) by mouth at bedtime. 06/21/21 07/21/21  Massengill, Harrold Donath, MD  hydrochlorothiazide (HYDRODIURIL) 12.5 MG tablet Take 1 tablet (12.5 mg total) by mouth daily. 06/22/21 07/22/21  Massengill, Harrold Donath, MD  loratadine (CLARITIN REDITABS) 10 MG dissolvable tablet Take 1 tablet (10 mg total) by mouth daily. As needed for allergy symptoms 06/16/21   Celedonio Savage, MD  pantoprazole (PROTONIX) 40 MG tablet Take 1 tablet (40 mg total) by mouth daily. 06/22/21 07/22/21  Massengill, Harrold Donath, MD  sertraline (ZOLOFT) 100 MG tablet Take 1 tablet (100 mg total) by mouth daily. 06/22/21 07/22/21  Massengill, Harrold Donath, MD  traZODone (DESYREL) 100 MG tablet Take 1 tablet (100 mg total) by mouth at  bedtime as needed for sleep. 06/21/21 07/21/21  Massengill, Harrold Donath, MD      Allergies    Ace inhibitors and Onion    Review of Systems   Review of Systems  Physical Exam Updated Vital Signs BP (!) 140/88   Pulse (!) 128   Temp 98.2 F (36.8 C) (Oral)   Resp (!) 22   Ht 5\' 9"  (1.753 m)   Wt 79 kg   SpO2 96%   BMI 25.72 kg/m  Physical Exam Vitals and nursing note reviewed.  Constitutional:      General: He is not in acute distress.    Appearance: He is well-developed.  HENT:     Head: Normocephalic and atraumatic.     Mouth/Throat:     Mouth: Mucous membranes are moist.  Eyes:     General: Vision grossly intact. Gaze aligned appropriately.     Extraocular Movements: Extraocular movements intact.     Conjunctiva/sclera: Conjunctivae normal.  Cardiovascular:     Rate and Rhythm: Regular rhythm. Tachycardia present.     Pulses: Normal pulses.     Heart sounds: Normal heart sounds, S1 normal and S2 normal. No murmur heard.    No friction rub. No gallop.  Pulmonary:     Effort: Pulmonary effort is normal. No respiratory distress.     Breath sounds: Normal breath sounds.  Abdominal:     Palpations: Abdomen is soft.     Tenderness: There is no  abdominal tenderness. There is no guarding or rebound.     Hernia: No hernia is present.  Musculoskeletal:        General: No swelling.     Cervical back: Full passive range of motion without pain, normal range of motion and neck supple. No pain with movement, spinous process tenderness or muscular tenderness. Normal range of motion.     Right lower leg: No edema.     Left lower leg: No edema.  Skin:    General: Skin is warm and dry.     Capillary Refill: Capillary refill takes less than 2 seconds.     Findings: No ecchymosis, erythema, lesion or wound.  Neurological:     Mental Status: He is alert and oriented to person, place, and time.     GCS: GCS eye subscore is 4. GCS verbal subscore is 5. GCS motor subscore is 6.      Cranial Nerves: Cranial nerves 2-12 are intact.     Sensory: Sensation is intact.     Motor: Motor function is intact. No weakness or abnormal muscle tone.     Coordination: Coordination is intact.  Psychiatric:        Mood and Affect: Mood normal.        Speech: Speech normal.        Behavior: Behavior normal.     ED Results / Procedures / Treatments   Labs (all labs ordered are listed, but only abnormal results are displayed) Labs Reviewed  COMPREHENSIVE METABOLIC PANEL - Abnormal; Notable for the following components:      Result Value   CO2 21 (*)    Glucose, Bld 106 (*)    Calcium 8.4 (*)    All other components within normal limits  ETHANOL - Abnormal; Notable for the following components:   Alcohol, Ethyl (B) 49 (*)    All other components within normal limits  RAPID URINE DRUG SCREEN, HOSP PERFORMED - Abnormal; Notable for the following components:   Cocaine POSITIVE (*)    All other components within normal limits  CBC WITH DIFFERENTIAL/PLATELET    EKG EKG Interpretation  Date/Time:  Saturday January 22 2022 01:02:47 EST Ventricular Rate:  150 PR Interval:  95 QRS Duration: 88 QT Interval:  334 QTC Calculation: 528 R Axis:   66 Text Interpretation: Sinus tachycardia Nonspecific T abnormalities, inferior leads Prolonged QT interval Confirmed by Gilda Crease [phone]) on 01/22/2022 3:18:46 AM  Radiology No results found.  Procedures Procedures    Medications Ordered in ED Medications  sodium chloride 0.9 % bolus 1,000 mL (1,000 mLs Intravenous New Bag/Given 01/22/22 0155)  LORazepam (ATIVAN) injection 2 mg (2 mg Intravenous Given 01/22/22 0155)    ED Course/ Medical Decision Making/ A&P                           Medical Decision Making Amount and/or Complexity of Data Reviewed Labs: ordered.  Risk Prescription drug management.   Presents to the emergency department with complaints of polysubstance drug abuse.  He has a history of  problems with alcohol as well as crack cocaine.  Patient last used tonight.  He is expressing depression, feelings of helplessness and hopelessness.  He does not feel like he has a support system.  He has not homicidal or suicidal but is certainly at risk.  Will perform TTS evaluation to see if he qualifies for inpatient versus outpatient management of his polysubstance abuse and depression.  Final Clinical Impression(s) / ED Diagnoses Final diagnoses:  Depression, unspecified depression type  Cocaine abuse (HCC)  Alcohol abuse    Rx / DC Orders ED Discharge Orders     None         Bandy Honaker, Canary Brim, MD 01/22/22 [phone]

## 2022-01-22 NOTE — ED Notes (Signed)
ED Provider at bedside. Dr Blinda Leatherwood

## 2022-01-22 NOTE — ED Notes (Signed)
TTS complete 

## 2022-01-22 NOTE — ED Notes (Addendum)
Gave pt urinal 96mis+ no urine at this time.

## 2022-01-22 NOTE — ED Notes (Signed)
Made aware that the patient has been psych cleared.  Patient states that he is ready to leave.  IV removed and provider made aware

## 2022-02-04 ENCOUNTER — Encounter (HOSPITAL_COMMUNITY): Payer: Self-pay

## 2022-02-04 DIAGNOSIS — M791 Myalgia, unspecified site: Secondary | ICD-10-CM | POA: Insufficient documentation

## 2022-02-04 DIAGNOSIS — Z79899 Other long term (current) drug therapy: Secondary | ICD-10-CM | POA: Insufficient documentation

## 2022-02-04 DIAGNOSIS — R059 Cough, unspecified: Secondary | ICD-10-CM | POA: Insufficient documentation

## 2022-02-04 DIAGNOSIS — R197 Diarrhea, unspecified: Secondary | ICD-10-CM | POA: Insufficient documentation

## 2022-02-04 DIAGNOSIS — R112 Nausea with vomiting, unspecified: Secondary | ICD-10-CM | POA: Insufficient documentation

## 2022-02-04 DIAGNOSIS — I10 Essential (primary) hypertension: Secondary | ICD-10-CM | POA: Insufficient documentation

## 2022-02-04 DIAGNOSIS — Z20822 Contact with and (suspected) exposure to covid-19: Secondary | ICD-10-CM | POA: Insufficient documentation

## 2022-02-04 DIAGNOSIS — R6883 Chills (without fever): Secondary | ICD-10-CM | POA: Insufficient documentation

## 2022-02-04 LAB — RESP PANEL BY RT-PCR (RSV, FLU A&B, COVID)  RVPGX2
Influenza A by PCR: NEGATIVE
Influenza B by PCR: NEGATIVE
Resp Syncytial Virus by PCR: NEGATIVE
SARS Coronavirus 2 by RT PCR: NEGATIVE

## 2022-02-04 NOTE — ED Triage Notes (Signed)
Pt with fever, chills, cough, vomiting for 2 days.

## 2022-02-05 ENCOUNTER — Emergency Department (HOSPITAL_COMMUNITY)
Admission: EM | Admit: 2022-02-05 | Discharge: 2022-02-05 | Disposition: A | Discharge status: Home / Self Care | Payer: Self-pay | Attending: Emergency Medicine | Admitting: Emergency Medicine

## 2022-02-05 DIAGNOSIS — K529 Noninfective gastroenteritis and colitis, unspecified: Secondary | ICD-10-CM

## 2022-02-05 LAB — COMPREHENSIVE METABOLIC PANEL
ALT: 24 U/L (ref 0–44)
AST: 31 U/L (ref 15–41)
Albumin: 3.9 g/dL (ref 3.5–5.0)
Alkaline Phosphatase: 59 U/L (ref 38–126)
Anion gap: 11 (ref 5–15)
BUN: 5 mg/dL — ABNORMAL LOW (ref 6–20)
CO2: 23 mmol/L (ref 22–32)
Calcium: 8.3 mg/dL — ABNORMAL LOW (ref 8.9–10.3)
Chloride: 106 mmol/L (ref 98–111)
Creatinine, Ser: 0.88 mg/dL (ref 0.61–1.24)
GFR, Estimated: >60 mL/min (ref >60)
Glucose, Bld: 90 mg/dL (ref 70–99)
Potassium: 3.7 mmol/L (ref 3.5–5.1)
Sodium: 140 mmol/L (ref 135–145)
Total Bilirubin: 0.7 mg/dL (ref 0.3–1.2)
Total Protein: 7 g/dL (ref 6.5–8.1)

## 2022-02-05 LAB — CBC WITH DIFFERENTIAL/PLATELET
Abs Immature Granulocytes: 0.01 10*3/uL (ref 0.00–0.07)
Basophils Absolute: 0.1 10*3/uL (ref 0.0–0.1)
Basophils Relative: 1 %
Eosinophils Absolute: 0.2 10*3/uL (ref 0.0–0.5)
Eosinophils Relative: 3 %
HCT: 43 % (ref 39.0–52.0)
Hemoglobin: 14.6 g/dL (ref 13.0–17.0)
Immature Granulocytes: 0 %
Lymphocytes Relative: 54 %
Lymphs Abs: 3.1 10*3/uL (ref 0.7–4.0)
MCH: 30.7 pg (ref 26.0–34.0)
MCHC: 34 g/dL (ref 30.0–36.0)
MCV: 90.3 fL (ref 80.0–100.0)
Monocytes Absolute: 0.3 10*3/uL (ref 0.1–1.0)
Monocytes Relative: 6 %
Neutro Abs: 2 10*3/uL (ref 1.7–7.7)
Neutrophils Relative %: 36 %
Platelets: 307 10*3/uL (ref 150–400)
RBC: 4.76 MIL/uL (ref 4.22–5.81)
RDW: 14.1 % (ref 11.5–15.5)
WBC: 5.7 10*3/uL (ref 4.0–10.5)
nRBC: 0 % (ref 0.0–0.2)

## 2022-02-05 MED ORDER — ONDANSETRON HCL 4 MG/2ML IJ SOLN
4.0000 mg | Freq: Once | INTRAMUSCULAR | Status: AC
  Administered 2022-02-05: 4 mg via INTRAVENOUS
  Filled 2022-02-05: qty 2

## 2022-02-05 MED ORDER — ONDANSETRON 8 MG PO TBDP: ORAL_TABLET | ORAL | 0 refills | Status: DC

## 2022-02-05 MED ORDER — SODIUM CHLORIDE 0.9 % IV BOLUS
1000.0000 mL | Freq: Once | INTRAVENOUS | Status: AC
  Administered 2022-02-05: 1000 mL via INTRAVENOUS

## 2022-02-05 MED ORDER — KETOROLAC TROMETHAMINE 30 MG/ML IJ SOLN
30.0000 mg | Freq: Once | INTRAMUSCULAR | Status: AC
  Administered 2022-02-05: 30 mg via INTRAVENOUS
  Filled 2022-02-05: qty 1

## 2022-02-05 NOTE — Discharge Instructions (Signed)
Begin taking Zofran as prescribed as needed for nausea.  Clear liquid diet for the next 12 hours, then gradually advance to normal as tolerated.  Return to the emergency department if you develop severe abdominal pain, high fever, bloody stool, or other new and concerning symptoms.

## 2022-02-05 NOTE — ED Provider Notes (Signed)
[facility] EMERGENCY DEPARTMENT Provider Note   CSN: [id number] Arrival date & time: 02/04/22  2114     History  Chief Complaint  Patient presents with   flu like symptoms    Pt with fever, chills, cough, vomiting for 2 days.    Paul Lambert is a 46 y.o. male.  Patient is a 46 year old male with past medical history of hypertension, seizures, alcohol abuse, depression.  Patient presenting today with complaints of nausea, vomiting, and diarrhea.  This has been ongoing for the past 2 days.  He has had multiple episodes of each with no blood.  He denies any fevers, but has felt chilled and reports body aches.  He also reports some cough.  There are no aggravating or alleviating factors.  The history is provided by the patient.       Home Medications Prior to Admission medications   Medication Sig Start Date End Date Taking? Authorizing Provider  amLODipine (NORVASC) 5 MG tablet Take 1 tablet (5 mg total) by mouth daily. 06/22/21 07/22/21  Massengill, Harrold Donath, MD  diphenhydrAMINE (BENADRYL ALLERGY) 25 mg capsule Take 1 capsule (25 mg total) by mouth every 6 (six) hours as needed. 06/16/21   Celedonio Savage, MD  famotidine (PEPCID) 20 MG tablet Take 1 tablet (20 mg total) by mouth 2 (two) times daily. Patient not taking: Reported on 06/14/2021 04/27/19   Bethann Berkshire, MD  gabapentin (NEURONTIN) 100 MG capsule Take 1 capsule (100 mg total) by mouth 2 (two) times daily. 06/21/21 07/21/21  Massengill, Harrold Donath, MD  gabapentin (NEURONTIN) 100 MG capsule Take 2 capsules (200 mg total) by mouth at bedtime. 06/21/21 07/21/21  Massengill, Harrold Donath, MD  hydrochlorothiazide (HYDRODIURIL) 12.5 MG tablet Take 1 tablet (12.5 mg total) by mouth daily. 06/22/21 07/22/21  Massengill, Harrold Donath, MD  loratadine (CLARITIN REDITABS) 10 MG dissolvable tablet Take 1 tablet (10 mg total) by mouth daily. As needed for allergy symptoms 06/16/21   Celedonio Savage, MD  pantoprazole (PROTONIX) 40 MG tablet Take 1 tablet (40 mg  total) by mouth daily. 06/22/21 07/22/21  Massengill, Harrold Donath, MD  sertraline (ZOLOFT) 100 MG tablet Take 1 tablet (100 mg total) by mouth daily. 06/22/21 07/22/21  Massengill, Harrold Donath, MD  traZODone (DESYREL) 100 MG tablet Take 1 tablet (100 mg total) by mouth at bedtime as needed for sleep. 06/21/21 07/21/21  Massengill, Harrold Donath, MD      Allergies    Ace inhibitors and Onion    Review of Systems   Review of Systems  All other systems reviewed and are negative.   Physical Exam Updated Vital Signs BP (!) 127/93 (BP Location: Left Arm)   Pulse 90   Temp 98.1 F (36.7 C) (Oral)   Resp 16   Ht 5\' 9"  (1.753 m)   Wt 79 kg   SpO2 96%   BMI 25.72 kg/m  Physical Exam Vitals and nursing note reviewed.  Constitutional:      General: He is not in acute distress.    Appearance: He is well-developed. He is not diaphoretic.  HENT:     Head: Normocephalic and atraumatic.  Cardiovascular:     Rate and Rhythm: Normal rate and regular rhythm.     Heart sounds: No murmur heard.    No friction rub.  Pulmonary:     Effort: Pulmonary effort is normal. No respiratory distress.     Breath sounds: Normal breath sounds. No wheezing or rales.  Abdominal:     General: Bowel sounds are normal.  There is no distension.     Palpations: Abdomen is soft.     Tenderness: There is no abdominal tenderness.  Musculoskeletal:        General: Normal range of motion.     Cervical back: Normal range of motion and neck supple.  Skin:    General: Skin is warm and dry.  Neurological:     Mental Status: He is alert and oriented to person, place, and time.     Coordination: Coordination normal.     ED Results / Procedures / Treatments   Labs (all labs ordered are listed, but only abnormal results are displayed) Labs Reviewed  RESP PANEL BY RT-PCR (RSV, FLU A&B, COVID)  [id number]    EKG None  Radiology No results found.  Procedures Procedures    Medications Ordered in ED Medications  sodium chloride 0.9  % bolus 1,000 mL (has no administration in time range)  ondansetron (ZOFRAN) injection 4 mg (has no administration in time range)  ketorolac (TORADOL) 30 MG/ML injection 30 mg (has no administration in time range)    ED Course/ Medical Decision Making/ A&P  Patient presenting here with complaints of nausea, vomiting, and diarrhea for the past 2 days.  His symptoms are most likely viral or foodborne in nature and likely to be self-limited.  Patient was given normal saline along with Zofran and Toradol and appears to be feeling better.  I have not witnessed additional episodes of diarrhea or vomiting while in the ER and I feel can safely be discharged.  CBC and metabolic panel are basically unremarkable.  I will prescribe Zofran and have patient follow-up as needed.  Final Clinical Impression(s) / ED Diagnoses Final diagnoses:  None    Rx / DC Orders ED Discharge Orders     None         Geoffery Lyons, MD 02/05/22 [phone]

## 2022-02-17 ENCOUNTER — Encounter (HOSPITAL_COMMUNITY): Payer: Self-pay | Admitting: Emergency Medicine

## 2022-02-17 ENCOUNTER — Emergency Department (HOSPITAL_COMMUNITY)
Admission: EM | Admit: 2022-02-17 | Discharge: 2022-02-17 | Discharge status: Against Medical Advice | Payer: Self-pay | Attending: Emergency Medicine | Admitting: Emergency Medicine

## 2022-02-17 ENCOUNTER — Other Ambulatory Visit: Payer: Self-pay

## 2022-02-17 DIAGNOSIS — F32A Depression, unspecified: Secondary | ICD-10-CM

## 2022-02-17 DIAGNOSIS — F1910 Other psychoactive substance abuse, uncomplicated: Secondary | ICD-10-CM | POA: Insufficient documentation

## 2022-02-17 DIAGNOSIS — I10 Essential (primary) hypertension: Secondary | ICD-10-CM | POA: Insufficient documentation

## 2022-02-17 DIAGNOSIS — Z5329 Procedure and treatment not carried out because of patient's decision for other reasons: Secondary | ICD-10-CM | POA: Insufficient documentation

## 2022-02-17 DIAGNOSIS — R451 Restlessness and agitation: Secondary | ICD-10-CM | POA: Insufficient documentation

## 2022-02-17 DIAGNOSIS — R45851 Suicidal ideations: Secondary | ICD-10-CM | POA: Insufficient documentation

## 2022-02-17 DIAGNOSIS — Z79899 Other long term (current) drug therapy: Secondary | ICD-10-CM | POA: Insufficient documentation

## 2022-02-17 LAB — BASIC METABOLIC PANEL
Anion gap: 10 (ref 5–15)
BUN: 9 mg/dL (ref 6–20)
CO2: 19 mmol/L — ABNORMAL LOW (ref 22–32)
Calcium: 8.8 mg/dL — ABNORMAL LOW (ref 8.9–10.3)
Chloride: 106 mmol/L (ref 98–111)
Creatinine, Ser: 1.11 mg/dL (ref 0.61–1.24)
GFR, Estimated: >60 mL/min (ref >60)
Glucose, Bld: 94 mg/dL (ref 70–99)
Potassium: 3.7 mmol/L (ref 3.5–5.1)
Sodium: 135 mmol/L (ref 135–145)

## 2022-02-17 LAB — CBC WITH DIFFERENTIAL/PLATELET
Abs Immature Granulocytes: 0.02 10*3/uL (ref 0.00–0.07)
Basophils Absolute: 0.1 10*3/uL (ref 0.0–0.1)
Basophils Relative: 1 %
Eosinophils Absolute: 0 10*3/uL (ref 0.0–0.5)
Eosinophils Relative: 0 %
HCT: 45.4 % (ref 39.0–52.0)
Hemoglobin: 15.6 g/dL (ref 13.0–17.0)
Immature Granulocytes: 0 %
Lymphocytes Relative: 31 %
Lymphs Abs: 2.2 10*3/uL (ref 0.7–4.0)
MCH: 31.3 pg (ref 26.0–34.0)
MCHC: 34.4 g/dL (ref 30.0–36.0)
MCV: 91 fL (ref 80.0–100.0)
Monocytes Absolute: 0.4 10*3/uL (ref 0.1–1.0)
Monocytes Relative: 5 %
Neutro Abs: 4.4 10*3/uL (ref 1.7–7.7)
Neutrophils Relative %: 63 %
Platelets: 275 10*3/uL (ref 150–400)
RBC: 4.99 MIL/uL (ref 4.22–5.81)
RDW: 13.8 % (ref 11.5–15.5)
WBC: 7 10*3/uL (ref 4.0–10.5)
nRBC: 0 % (ref 0.0–0.2)

## 2022-02-17 LAB — ETHANOL: Alcohol, Ethyl (B): 208 mg/dL — ABNORMAL HIGH (ref <10)

## 2022-02-17 NOTE — ED Notes (Signed)
Pt asked what was going to happen, I told him he needed to speak to his nurse as I was not sure of his situation. I got his medic to talk with him and tell him what was going on and he needed to wait to be TTS. Patient got up and walked up MD aware.

## 2022-02-17 NOTE — ED Triage Notes (Addendum)
Pt asking to be sent off to detox from ETOH and cocaine. Hx of same.  Pt admits to using cocaine and drinking alcohol earlier tonight.

## 2022-02-17 NOTE — ED Provider Notes (Signed)
[facility] EMERGENCY DEPARTMENT Provider Note   CSN: [id number] Arrival date & time: 02/17/22  0214     History  Chief Complaint  Patient presents with   Detox    Paul Lambert is a 47 y.o. male.  Patient is a 47 year old male with history of polysubstance abuse, hypertension.  Patient presenting today with complaints of substance abuse and requesting detox.  He states that he has been using crack cocaine, marijuana, and alcohol, most recently this evening.  He reports stressors with his marriage as exacerbating factors.  He describes feeling depressed and at times suicidal.  Patient called law enforcement and was brought here by them.  The history is provided by the patient.       Home Medications Prior to Admission medications   Medication Sig Start Date End Date Taking? Authorizing Provider  amLODipine (NORVASC) 5 MG tablet Take 1 tablet (5 mg total) by mouth daily. 06/22/21 07/22/21  Massengill, Ovid Curd, MD  diphenhydrAMINE (BENADRYL ALLERGY) 25 mg capsule Take 1 capsule (25 mg total) by mouth every 6 (six) hours as needed. 06/16/21   Concepcion Living, MD  famotidine (PEPCID) 20 MG tablet Take 1 tablet (20 mg total) by mouth 2 (two) times daily. Patient not taking: Reported on 06/14/2021 04/27/19   Milton Ferguson, MD  gabapentin (NEURONTIN) 100 MG capsule Take 1 capsule (100 mg total) by mouth 2 (two) times daily. 06/21/21 07/21/21  Massengill, Ovid Curd, MD  gabapentin (NEURONTIN) 100 MG capsule Take 2 capsules (200 mg total) by mouth at bedtime. 06/21/21 07/21/21  Massengill, Ovid Curd, MD  hydrochlorothiazide (HYDRODIURIL) 12.5 MG tablet Take 1 tablet (12.5 mg total) by mouth daily. 06/22/21 07/22/21  Massengill, Ovid Curd, MD  loratadine (CLARITIN REDITABS) 10 MG dissolvable tablet Take 1 tablet (10 mg total) by mouth daily. As needed for allergy symptoms 06/16/21   Concepcion Living, MD  ondansetron (ZOFRAN-ODT) 8 MG disintegrating tablet 8mg  ODT q4 hours prn nausea 02/05/22   Veryl Speak, MD   pantoprazole (PROTONIX) 40 MG tablet Take 1 tablet (40 mg total) by mouth daily. 06/22/21 07/22/21  Massengill, Ovid Curd, MD  sertraline (ZOLOFT) 100 MG tablet Take 1 tablet (100 mg total) by mouth daily. 06/22/21 07/22/21  Massengill, Ovid Curd, MD  traZODone (DESYREL) 100 MG tablet Take 1 tablet (100 mg total) by mouth at bedtime as needed for sleep. 06/21/21 07/21/21  Massengill, Ovid Curd, MD      Allergies    Ace inhibitors and Onion    Review of Systems   Review of Systems  All other systems reviewed and are negative.   Physical Exam Updated Vital Signs BP 131/83   Pulse 95   Temp 98.8 F (37.1 C) (Oral)   Resp 18   Ht 5\' 9"  (1.753 m)   Wt 79 kg   SpO2 100%   BMI 25.72 kg/m  Physical Exam Vitals and nursing note reviewed.  Constitutional:      General: He is not in acute distress.    Appearance: He is well-developed. He is not diaphoretic.  HENT:     Head: Normocephalic and atraumatic.  Cardiovascular:     Rate and Rhythm: Normal rate and regular rhythm.     Heart sounds: No murmur heard.    No friction rub.  Pulmonary:     Effort: Pulmonary effort is normal. No respiratory distress.     Breath sounds: Normal breath sounds. No wheezing or rales.  Abdominal:     General: Bowel sounds are normal. There is no distension.  Palpations: Abdomen is soft.     Tenderness: There is no abdominal tenderness.  Musculoskeletal:        General: Normal range of motion.     Cervical back: Normal range of motion and neck supple.  Skin:    General: Skin is warm and dry.  Neurological:     Mental Status: He is alert and oriented to person, place, and time.     Coordination: Coordination normal.  Psychiatric:        Attention and Perception: He is inattentive.        Mood and Affect: Affect is blunt.        Behavior: Behavior is agitated.        Thought Content: Thought content includes suicidal ideation. Thought content does not include homicidal ideation. Thought content does not  include homicidal or suicidal plan.     ED Results / Procedures / Treatments   Labs (all labs ordered are listed, but only abnormal results are displayed) Labs Reviewed - No data to display  EKG None  Radiology No results found.  Procedures Procedures    Medications Ordered in ED Medications - No data to display  ED Course/ Medical Decision Making/ A&P  Patient with history of polysubstance abuse presenting requesting detox.  Patient states that he feels depressed and somewhat suicidal over his inability to control his addiction and the stress that this is caused his marriage.  Patient's laboratory studies are basically unremarkable (with the exception of an EtOH of 208) and he appears medically cleared otherwise.  TTS will be consulted to assist in determining the final disposition.  Final Clinical Impression(s) / ED Diagnoses Final diagnoses:  None    Rx / DC Orders ED Discharge Orders     None         Veryl Speak, MD 02/17/22 [phone]

## 2022-02-17 NOTE — ED Provider Notes (Signed)
  Physical Exam  BP 131/83   Pulse 95   Temp 98.8 F (37.1 C) (Oral)   Resp 18   Ht 5\' 9"  (1.753 m)   Wt 79 kg   SpO2 100%   BMI 25.72 kg/m   Physical Exam Vitals and nursing note reviewed.  Constitutional:      General: He is not in acute distress.    Appearance: He is well-developed.  HENT:     Head: Normocephalic and atraumatic.  Eyes:     Conjunctiva/sclera: Conjunctivae normal.  Cardiovascular:     Rate and Rhythm: Normal rate and regular rhythm.     Heart sounds: No murmur heard. Pulmonary:     Effort: Pulmonary effort is normal. No respiratory distress.  Musculoskeletal:        General: No swelling.     Cervical back: Neck supple.  Skin:    General: Skin is warm and dry.  Neurological:     Mental Status: He is alert.  Psychiatric:        Mood and Affect: Mood normal.     Procedures  Procedures  ED Course / MDM    Medical Decision Making Amount and/or Complexity of Data Reviewed Labs: ordered.   Patient received an handoff.  Alcohol and polysubstance use requesting detox and associated depression requesting psychiatric assistance.  Patient not under IVC with no suicidal or homicidal ideation, no auditory or visual hallucinations.  Unfortunately, the patient eloped from the emergency department prior to TTS evaluation.       Teressa Lower, MD 02/17/22 [phone]

## 2022-02-21 ENCOUNTER — Telehealth: Payer: Self-pay

## 2022-02-21 NOTE — Telephone Encounter (Signed)
Received patient's information from [facility]. Attempted to reach patient by phone. Patient's mother answered and stated he wasn't at home but will relay the message that [facility] attempted to reach out to him

## 2022-03-29 ENCOUNTER — Emergency Department (HOSPITAL_COMMUNITY)
Admission: EM | Admit: 2022-03-29 | Discharge: 2022-03-29 | Disposition: A | Discharge status: Home / Self Care | Payer: Self-pay | Attending: Emergency Medicine | Admitting: Emergency Medicine

## 2022-03-29 ENCOUNTER — Emergency Department (HOSPITAL_COMMUNITY): Payer: Self-pay

## 2022-03-29 ENCOUNTER — Other Ambulatory Visit: Payer: Self-pay

## 2022-03-29 ENCOUNTER — Encounter (HOSPITAL_COMMUNITY): Payer: Self-pay

## 2022-03-29 DIAGNOSIS — Y93K9 Activity, other involving animal care: Secondary | ICD-10-CM | POA: Insufficient documentation

## 2022-03-29 DIAGNOSIS — W19XXXA Unspecified fall, initial encounter: Secondary | ICD-10-CM | POA: Insufficient documentation

## 2022-03-29 DIAGNOSIS — M25562 Pain in left knee: Secondary | ICD-10-CM | POA: Insufficient documentation

## 2022-03-29 MED ORDER — IBUPROFEN 800 MG PO TABS
800.0000 mg | ORAL_TABLET | Freq: Once | ORAL | Status: AC
  Administered 2022-03-29: 800 mg via ORAL
  Filled 2022-03-29: qty 1

## 2022-03-29 MED ORDER — IBUPROFEN 800 MG PO TABS: 800.0000 mg | ORAL_TABLET | Freq: Three times a day (TID) | ORAL | 0 refills | Status: DC

## 2022-03-29 MED ORDER — HYDROCODONE-ACETAMINOPHEN 5-325 MG PO TABS: ORAL_TABLET | ORAL | 0 refills | Status: DC

## 2022-03-29 NOTE — ED Triage Notes (Signed)
Pt c/o left knee injury, slipped while chasing dog 2 days ago. Pt unable to bear weight on left leg.

## 2022-03-29 NOTE — Discharge Instructions (Signed)
Use the crutches for walking or standing.  Wear the brace on your knee for support you may remove at bedtime.  Elevate your knee and apply ice packs on and off.  Call the orthopedic provider listed to arrange a follow-up appointment.

## 2022-04-01 NOTE — ED Provider Notes (Signed)
[facility] Provider Note   CSN: [id number] Arrival date & time: 03/29/22  1316     History  Chief Complaint  Patient presents with   Knee Injury    Left    Paul Lambert is a 47 y.o. male.  HPI      Paul Lambert is a 47 y.o. male who presents to the Emergency Department complaining of pain to his left anterior knee.  Symptoms began 2 days ago after a mechanical fall while chasing his dog.  States he fell on the anterior knee.  He has had pain to the knee associated with movement and weightbearing.  Some mild swelling.  He denies any numbness of his leg, hip pain, ankle pain or pain of his lower leg.   Home Medications Prior to Admission medications   Medication Sig Start Date End Date Taking? Authorizing Provider  HYDROcodone-acetaminophen (NORCO/VICODIN) 5-325 MG tablet Take one tab po q 4 hrs prn pain 03/29/22  Yes Jaikob Borgwardt, PA-C  ibuprofen (ADVIL) 800 MG tablet Take 1 tablet (800 mg total) by mouth 3 (three) times daily. Take with food 03/29/22  Yes Murle Hellstrom, PA-C  amLODipine (NORVASC) 5 MG tablet Take 1 tablet (5 mg total) by mouth daily. 06/22/21 07/22/21  Massengill, Ovid Curd, MD  diphenhydrAMINE (BENADRYL ALLERGY) 25 mg capsule Take 1 capsule (25 mg total) by mouth every 6 (six) hours as needed. 06/16/21   Concepcion Living, MD  famotidine (PEPCID) 20 MG tablet Take 1 tablet (20 mg total) by mouth 2 (two) times daily. Patient not taking: Reported on 06/14/2021 04/27/19   Milton Ferguson, MD  gabapentin (NEURONTIN) 100 MG capsule Take 1 capsule (100 mg total) by mouth 2 (two) times daily. 06/21/21 07/21/21  Massengill, Ovid Curd, MD  gabapentin (NEURONTIN) 100 MG capsule Take 2 capsules (200 mg total) by mouth at bedtime. 06/21/21 07/21/21  Massengill, Ovid Curd, MD  hydrochlorothiazide (HYDRODIURIL) 12.5 MG tablet Take 1 tablet (12.5 mg total) by mouth daily. 06/22/21 07/22/21  Massengill, Ovid Curd, MD  loratadine (CLARITIN REDITABS) 10 MG  dissolvable tablet Take 1 tablet (10 mg total) by mouth daily. As needed for allergy symptoms 06/16/21   Concepcion Living, MD  ondansetron (ZOFRAN-ODT) 8 MG disintegrating tablet '8mg'$  ODT q4 hours prn nausea 02/05/22   Veryl Speak, MD  pantoprazole (PROTONIX) 40 MG tablet Take 1 tablet (40 mg total) by mouth daily. 06/22/21 07/22/21  Massengill, Ovid Curd, MD  sertraline (ZOLOFT) 100 MG tablet Take 1 tablet (100 mg total) by mouth daily. 06/22/21 07/22/21  Massengill, Ovid Curd, MD  traZODone (DESYREL) 100 MG tablet Take 1 tablet (100 mg total) by mouth at bedtime as needed for sleep. 06/21/21 07/21/21  Massengill, Ovid Curd, MD      Allergies    Ace inhibitors and Onion    Review of Systems   Review of Systems  Constitutional:  Negative for chills and fever.  Gastrointestinal:  Negative for nausea and vomiting.  Musculoskeletal:  Positive for arthralgias (Left knee pain).  Skin:  Negative for color change, rash and wound.  Neurological:  Negative for weakness and numbness.    Physical Exam Updated Vital Signs BP (!) 157/112 (BP Location: Right Arm)   Pulse (!) 116   Temp 99.1 F (37.3 C) (Oral)   Resp 20   Ht '5\' 9"'$  (1.753 m)   Wt 86.2 kg   SpO2 99%   BMI 28.06 kg/m  Physical Exam Vitals and nursing note reviewed.  Constitutional:  General: He is not in acute distress.    Appearance: He is not ill-appearing or toxic-appearing.  HENT:     Head: Atraumatic.  Cardiovascular:     Rate and Rhythm: Normal rate and regular rhythm.     Pulses: Normal pulses.  Pulmonary:     Effort: Pulmonary effort is normal.  Musculoskeletal:        General: Swelling, tenderness and signs of injury present. No deformity.     Comments: Diffuse edema to the anterior aspect of the left knee.  No palpable bony deformity, erythema, excessive warmth or palpable effusion.  No step-off deformity of the patella.  No obvious ligamentous instability.  Skin:    General: Skin is warm.     Capillary Refill: Capillary  refill takes less than 2 seconds.     Findings: No bruising or erythema.  Neurological:     General: No focal deficit present.     Mental Status: He is alert.     Sensory: No sensory deficit.     Motor: No weakness.     ED Results / Procedures / Treatments   Labs (all labs ordered are listed, but only abnormal results are displayed) Labs Reviewed - No data to display  EKG None  Radiology DG Knee 2 Views Left  Result Date: 03/29/2022 CLINICAL DATA:  Fall, left knee pain EXAM: LEFT KNEE - 1-2 VIEW COMPARISON:  03/28/2022 FINDINGS: No evidence of fracture or dislocation. Trace joint effusion. No evidence of arthropathy or other focal bone abnormality. Soft tissue swelling at the medial aspect of the knee. IMPRESSION: 1. No acute fracture or dislocation of the left knee. 2. Trace knee joint effusion, nonspecific. 3. Soft tissue swelling at the medial aspect of the knee. Electronically Signed   By: Davina Poke D.O.   On: 03/29/2022 14:50     Procedures Procedures    Medications Ordered in ED Medications  ibuprofen (ADVIL) tablet 800 mg (800 mg Oral Given 03/29/22 1512)    ED Course/ Medical Decision Making/ A&P                             Medical Decision Making Left knee pain after mechanical fall that occurred 2 days ago.  He has pain with movement and weightbearing.  Pain is diffuse and located anteriorly.  Neurovascularly intact and compartments of the extremity are soft.  Differential would include musculoskeletal injury, internal derangement, fracture  Amount and/or Complexity of Data Reviewed Radiology: ordered.    Details: X-ray of the left knee shows soft tissue swelling with trace effusion no evidence of acute fracture or dislocation Discussion of management or test interpretation with external provider(s): Likely sprain, patient agreeable to symptomatic treatment, crutches and knee immobilizer given.  RICE therapy recommended and he is agreeable to close  outpatient follow-up with orthopedics.  Risk Prescription drug management.           Final Clinical Impression(s) / ED Diagnoses Final diagnoses:  Acute pain of left knee    Rx / DC Orders ED Discharge Orders          Ordered    ibuprofen (ADVIL) 800 MG tablet  3 times daily        03/29/22 1519    HYDROcodone-acetaminophen (NORCO/VICODIN) 5-325 MG tablet        03/29/22 1519              Zayne Marovich, PA-C 04/01/22 1329  Hayden Rasmussen, MD 04/02/22 [phone]

## 2022-04-02 ENCOUNTER — Emergency Department (HOSPITAL_COMMUNITY)
Admission: EM | Admit: 2022-04-02 | Discharge: 2022-04-02 | Disposition: A | Discharge status: Home / Self Care | Payer: Self-pay | Attending: Emergency Medicine | Admitting: Emergency Medicine

## 2022-04-02 ENCOUNTER — Other Ambulatory Visit: Payer: Self-pay

## 2022-04-02 ENCOUNTER — Encounter (HOSPITAL_COMMUNITY): Payer: Self-pay | Admitting: *Deleted

## 2022-04-02 DIAGNOSIS — Z79899 Other long term (current) drug therapy: Secondary | ICD-10-CM | POA: Insufficient documentation

## 2022-04-02 DIAGNOSIS — X501XXA Overexertion from prolonged static or awkward postures, initial encounter: Secondary | ICD-10-CM | POA: Insufficient documentation

## 2022-04-02 DIAGNOSIS — M25562 Pain in left knee: Secondary | ICD-10-CM

## 2022-04-02 DIAGNOSIS — S8992XA Unspecified injury of left lower leg, initial encounter: Secondary | ICD-10-CM | POA: Insufficient documentation

## 2022-04-02 MED ORDER — KETOROLAC TROMETHAMINE 15 MG/ML IJ SOLN
15.0000 mg | Freq: Once | INTRAMUSCULAR | Status: AC
  Administered 2022-04-02: 15 mg via INTRAMUSCULAR
  Filled 2022-04-02: qty 1

## 2022-04-02 MED ORDER — ETODOLAC 400 MG PO TABS: 400.0000 mg | ORAL_TABLET | Freq: Two times a day (BID) | ORAL | 0 refills | Status: DC

## 2022-04-02 NOTE — ED Provider Notes (Signed)
[facility] Provider Note   CSN: [id number] Arrival date & time: 04/02/22  1801     History  Chief Complaint  Patient presents with   Knee Injury    Paul Lambert is a 47 y.o. male.  47 year old male presents today for evaluation of left knee pain.  He was seen 4 days ago in this emergency department for the same complaint.  He states he was chasing a dog when he twisted his knee.  He was given knee immobilizer and crutches.  He states he was given pain medication while he was in the emergency department and is still having some pain and would like more pain medication.  Patient was able to ambulate without difficulty.  Remained on the phone throughout the interview.  Denies fever, chills, any new injuries.  The history is provided by the patient. No language interpreter was used.       Home Medications Prior to Admission medications   Medication Sig Start Date End Date Taking? Authorizing Provider  etodolac (LODINE) 400 MG tablet Take 1 tablet (400 mg total) by mouth 2 (two) times daily. 04/02/22  Yes Raniyah Curenton, PA-C  amLODipine (NORVASC) 5 MG tablet Take 1 tablet (5 mg total) by mouth daily. 06/22/21 07/22/21  Massengill, Ovid Curd, MD  diphenhydrAMINE (BENADRYL ALLERGY) 25 mg capsule Take 1 capsule (25 mg total) by mouth every 6 (six) hours as needed. 06/16/21   Concepcion Living, MD  famotidine (PEPCID) 20 MG tablet Take 1 tablet (20 mg total) by mouth 2 (two) times daily. Patient not taking: Reported on 06/14/2021 04/27/19   Milton Ferguson, MD  gabapentin (NEURONTIN) 100 MG capsule Take 1 capsule (100 mg total) by mouth 2 (two) times daily. 06/21/21 07/21/21  Massengill, Ovid Curd, MD  gabapentin (NEURONTIN) 100 MG capsule Take 2 capsules (200 mg total) by mouth at bedtime. 06/21/21 07/21/21  Massengill, Ovid Curd, MD  hydrochlorothiazide (HYDRODIURIL) 12.5 MG tablet Take 1 tablet (12.5 mg total) by mouth daily. 06/22/21 07/22/21  Massengill, Ovid Curd, MD   HYDROcodone-acetaminophen (NORCO/VICODIN) 5-325 MG tablet Take one tab po q 4 hrs prn pain 03/29/22   Triplett, Tammy, PA-C  loratadine (CLARITIN REDITABS) 10 MG dissolvable tablet Take 1 tablet (10 mg total) by mouth daily. As needed for allergy symptoms 06/16/21   Concepcion Living, MD  ondansetron (ZOFRAN-ODT) 8 MG disintegrating tablet '8mg'$  ODT q4 hours prn nausea 02/05/22   Veryl Speak, MD  pantoprazole (PROTONIX) 40 MG tablet Take 1 tablet (40 mg total) by mouth daily. 06/22/21 07/22/21  Massengill, Ovid Curd, MD  sertraline (ZOLOFT) 100 MG tablet Take 1 tablet (100 mg total) by mouth daily. 06/22/21 07/22/21  Massengill, Ovid Curd, MD  traZODone (DESYREL) 100 MG tablet Take 1 tablet (100 mg total) by mouth at bedtime as needed for sleep. 06/21/21 07/21/21  Massengill, Ovid Curd, MD      Allergies    Ace inhibitors and Onion    Review of Systems   Review of Systems  Musculoskeletal:  Positive for arthralgias and joint swelling. Negative for gait problem.  All other systems reviewed and are negative.   Physical Exam Updated Vital Signs BP (!) 151/113   Pulse (!) 115   Temp 98.4 F (36.9 C) (Oral)   Resp 20   Ht '5\' 9"'$  (1.753 m)   Wt 86.2 kg   SpO2 95%   BMI 28.06 kg/m  Physical Exam Vitals and nursing note reviewed.  Constitutional:      General: He is  not in acute distress.    Appearance: Normal appearance. He is not ill-appearing.  HENT:     Head: Normocephalic and atraumatic.     Nose: Nose normal.  Eyes:     Conjunctiva/sclera: Conjunctivae normal.  Pulmonary:     Effort: Pulmonary effort is normal. No respiratory distress.  Musculoskeletal:        General: No deformity.     Comments: Refusing to move the left knee.  Without tenderness to palpation.  No significant swelling or deformity noted.  No evidence of patellar dislocation.  Ankle with full range of motion and without tenderness to palpation.  Left hip with full range of motion and without tenderness to palpation.  Skin:     Findings: No rash.  Neurological:     Mental Status: He is alert.     ED Results / Procedures / Treatments   Labs (all labs ordered are listed, but only abnormal results are displayed) Labs Reviewed - No data to display  EKG None  Radiology No results found.  Procedures Procedures    Medications Ordered in ED Medications  ketorolac (TORADOL) 15 MG/ML injection 15 mg (has no administration in time range)    ED Course/ Medical Decision Making/ A&P                             Medical Decision Making Risk Prescription drug management.   47 year old male presents with left knee pain.  Ongoing for the past 4 days.  Previously evaluated and had an x-ray done.  States pain is worsening.  No signs of septic joint.  Without warmth, erythema or significant tenderness to palpation.  He was able to ambulate into the room without much difficulty.  Does not have his knee immobilizer in place.  I offered him another x-ray however he refused.  Appears to be looking for particular pain medication.  He names Percocet.  I offered him Toradol, and discharged with Lodine.  He is in agreement.  Discharged in stable condition.   Final Clinical Impression(s) / ED Diagnoses Final diagnoses:  Acute pain of left knee    Rx / DC Orders ED Discharge Orders          Ordered    etodolac (LODINE) 400 MG tablet  2 times daily        04/02/22 1911              Evlyn Courier, PA-C 04/02/22 1916    Isla Pence, MD 04/03/22 [phone]

## 2022-04-02 NOTE — ED Triage Notes (Signed)
Here by POV for L knee injury 4d ago, seen here 4d ago for the same, here for worsening pain and swelling. not using prescribed crutches or knee immobizer. Ambulatory, carrying knee immobilizer.

## 2022-04-02 NOTE — Discharge Instructions (Signed)
Your exam today is overall reassuring.  X-ray from a few days ago did not show any fractures or other bony injury.  We discussed obtaining an x-ray today however you did not want to proceed with them.  You received a shot of Toradol in the emergency department.  I have sent Lodine into the pharmacy for you.  For any concerning symptoms or you can return to the emergency department.

## 2022-04-02 NOTE — ED Notes (Signed)
Ice applied to left knee and pillow support

## 2022-04-16 ENCOUNTER — Emergency Department (HOSPITAL_COMMUNITY)
Admission: EM | Admit: 2022-04-16 | Discharge: 2022-04-16 | Disposition: A | Discharge status: Home / Self Care | Payer: Self-pay | Attending: Emergency Medicine | Admitting: Emergency Medicine

## 2022-04-16 ENCOUNTER — Emergency Department (HOSPITAL_COMMUNITY)
Admission: EM | Admit: 2022-04-16 | Discharge: 2022-04-17 | Disposition: A | Discharge status: Home / Self Care | Payer: Self-pay | Attending: Emergency Medicine | Admitting: Emergency Medicine

## 2022-04-16 ENCOUNTER — Other Ambulatory Visit: Payer: Self-pay

## 2022-04-16 ENCOUNTER — Encounter (HOSPITAL_COMMUNITY): Payer: Self-pay

## 2022-04-16 ENCOUNTER — Encounter (HOSPITAL_COMMUNITY): Payer: Self-pay | Admitting: *Deleted

## 2022-04-16 ENCOUNTER — Emergency Department (HOSPITAL_COMMUNITY): Payer: Self-pay

## 2022-04-16 DIAGNOSIS — M25562 Pain in left knee: Secondary | ICD-10-CM

## 2022-04-16 DIAGNOSIS — I10 Essential (primary) hypertension: Secondary | ICD-10-CM | POA: Insufficient documentation

## 2022-04-16 DIAGNOSIS — X509XXA Other and unspecified overexertion or strenuous movements or postures, initial encounter: Secondary | ICD-10-CM | POA: Insufficient documentation

## 2022-04-16 DIAGNOSIS — M25511 Pain in right shoulder: Secondary | ICD-10-CM | POA: Insufficient documentation

## 2022-04-16 DIAGNOSIS — Z79899 Other long term (current) drug therapy: Secondary | ICD-10-CM | POA: Insufficient documentation

## 2022-04-16 DIAGNOSIS — F17210 Nicotine dependence, cigarettes, uncomplicated: Secondary | ICD-10-CM | POA: Insufficient documentation

## 2022-04-16 DIAGNOSIS — M79605 Pain in left leg: Secondary | ICD-10-CM | POA: Insufficient documentation

## 2022-04-16 MED ORDER — IBUPROFEN 800 MG PO TABS
800.0000 mg | ORAL_TABLET | Freq: Once | ORAL | Status: AC
  Administered 2022-04-16: 800 mg via ORAL
  Filled 2022-04-16: qty 1
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
## 2022-04-16 NOTE — Discharge Instructions (Addendum)
Evaluation for your right shoulder pain and left knee pain is overall reassuring.  Exam did not reveal any swelling, bruising or concern for effusion.  Have low suspicion that there is a fracture or dislocation given how clinically well you appear.  You can continue taking Tylenol and ibuprofen as needed for symptomatic relief.  Otherwise recommend follow-up with your PCP.
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
## 2022-04-16 NOTE — ED Notes (Signed)
Patient transported to X-ray 

## 2022-04-16 NOTE — ED Triage Notes (Signed)
Pt arrived from home via POV c/o left leg pain esp knee, ankle and foot 10/10 on pain scale. Pt states that while walking his dog he twisted his leg.

## 2022-04-17 MED ORDER — NAPROXEN 500 MG PO TABS: 500.0000 mg | ORAL_TABLET | Freq: Two times a day (BID) | ORAL | 0 refills | Status: DC

## 2022-04-17 NOTE — ED Provider Notes (Signed)
[facility] Provider Note MRN:  [id number]  Arrival date & time: 04/17/22     Chief Complaint   Leg Pain   History of Present Illness   [facility] is a 47 y.o. year-old male with a history of substance use disorder, hypertension presenting to the ED with chief complaint of leg pain.  Pain to the left leg, more specifically the left knee.  Thinks he injured it while he had his dog on the leash and the dog wrapped the leash around him.  This occurred a week or so ago.  No other injuries or complaints.  Review of Systems  A thorough review of systems was obtained and all systems are negative except as noted in the HPI and PMH.   Patient's Health History    Past Medical History:  Diagnosis Date   Cocaine abuse ([facility])    ETOH abuse    Hypertension    Seizures ([facility])    Substance abuse ([facility])     Past Surgical History:  Procedure Laterality Date   FACIAL COSMETIC SURGERY      Family History  Problem Relation Age of Onset   Hypertension Mother    Hypertension Father     Social History   Socioeconomic History   Marital status: Single    Spouse name: Not on file   Number of children: Not on file   Years of education: Not on file   Highest education level: Not on file  Occupational History   Not on file  Tobacco Use   Smoking status: Some Days    Packs/day: 0.50    Types: Cigarettes   Smokeless tobacco: Never  Vaping Use   Vaping Use: Never used  Substance and Sexual Activity   Alcohol use: Yes    Alcohol/week: 40.0 - 52.0 standard drinks of alcohol    Types: 24 - 36 Cans of beer, 16 Shots of liquor per week   Drug use: Yes    Frequency: 7.0 times per week    Types: Marijuana   Sexual activity: Not on file  Other Topics Concern   Not on file  Social History Narrative   Pt is a 47 y/o [facility] male with history of polysubstance abuse (cocaine, etoh & marijuana). Currently homeless and without a job.   Social  Determinants of Health   Financial Resource Strain: Not on file  Food Insecurity: Not on file  Transportation Needs: Not on file  Physical Activity: Not on file  Stress: Not on file  Social Connections: Not on file  Intimate Partner Violence: Not on file     Physical Exam   Vitals:   04/16/22 2251  BP: (!) 130/93  Pulse: (!) 118  Resp: 18  Temp: 97.6 F (36.4 C)  SpO2: 97%    CONSTITUTIONAL: Well-appearing, NAD NEURO/PSYCH:  Alert and oriented x 3, no focal deficits EYES:  eyes equal and reactive ENT/NECK:  no LAD, no JVD CARDIO: Regular rate, well-perfused, normal S1 and S2 PULM:  CTAB no wheezing or rhonchi GI/GU:  non-distended, non-tender MSK/SPINE:  No gross deformities, no edema SKIN:  no rash, atraumatic   *Additional and/or pertinent findings included in MDM below  Diagnostic and Interventional Summary    EKG Interpretation  Date/Time:    Ventricular Rate:    PR Interval:    QRS Duration:   QT Interval:    QTC Calculation:   R Axis:     Text Interpretation:  Labs Reviewed - No data to display  DG Foot Complete Left    (Results Pending)  DG Ankle Complete Left    (Results Pending)  DG Knee 3 Views Left    (Results Pending)    Medications - No data to display   Procedures  /  Critical Care Procedures  ED Course and Medical Decision Making  Initial Impression and Ddx The extremities appear symmetric and normal, no increased warmth or erythema, preserved range of motion of the joints, highly doubt DVT or other vascular emergency, neurovascularly intact, highly doubt septic joint.  Past medical/surgical history that increases complexity of ED encounter: Substance use disorder, frequent ED visitor.  Interpretation of Diagnostics I personally reviewed the knee x-ray and my interpretation is as follows: Mild effusion, no fractures to the leg    Patient Reassessment and Ultimate Disposition/Management     Discharge  Patient management  required discussion with the following services or consulting groups:  None  Complexity of Problems Addressed Acute complicated illness or Injury  Additional Data Reviewed and Analyzed Further history obtained from: None  Additional Factors Impacting ED Encounter Risk Prescriptions  Barth Kirks. Sedonia Small, [place] [email or web address]  Final Clinical Impressions(s) / ED Diagnoses     ICD-10-CM   1. Acute pain of left knee  M25.562       ED Discharge Orders          Ordered    naproxen (NAPROSYN) 500 MG tablet  2 times daily        04/17/22 0057             Discharge Instructions Discussed with and Provided to Patient:    Discharge Instructions      You were evaluated in the [facility] and after careful evaluation, we did not find any emergent condition requiring admission or further testing in the hospital.  Your exam/testing today is overall reassuring.  X-rays without any significant injuries.  Use the Naprosyn anti-inflammatory twice daily for pain.  If not improving over the next week, recommend follow-up with an orthopedic specialist.  Please return to the [facility] if you experience any worsening of your condition.   Thank you for allowing [place] to be a part of your care.      Maudie Flakes, MD 04/17/22 [phone]

## 2022-04-17 NOTE — Discharge Instructions (Signed)
You were evaluated in the Emergency Department and after careful evaluation, we did not find any emergent condition requiring admission or further testing in the hospital.  Your exam/testing today is overall reassuring.  X-rays without any significant injuries.  Use the Naprosyn anti-inflammatory twice daily for pain.  If not improving over the next week, recommend follow-up with an orthopedic specialist.  Please return to the Emergency Department if you experience any worsening of your condition.   Thank you for allowing [place] to be a part of your care.

## 2022-04-26 ENCOUNTER — Other Ambulatory Visit: Payer: Self-pay

## 2022-04-26 ENCOUNTER — Encounter (HOSPITAL_COMMUNITY): Payer: Self-pay

## 2022-04-26 DIAGNOSIS — I10 Essential (primary) hypertension: Secondary | ICD-10-CM | POA: Insufficient documentation

## 2022-04-26 DIAGNOSIS — Z79899 Other long term (current) drug therapy: Secondary | ICD-10-CM | POA: Insufficient documentation

## 2022-04-26 DIAGNOSIS — G8929 Other chronic pain: Secondary | ICD-10-CM | POA: Insufficient documentation

## 2022-04-26 DIAGNOSIS — M25562 Pain in left knee: Secondary | ICD-10-CM | POA: Insufficient documentation

## 2022-04-26 NOTE — ED Triage Notes (Signed)
Pt complaining of left knee pain that started 10 days ago, was supposed to see orthopedic, but was incarcerated. Now is complaining of his knee pain. He also wants to seek help with alcohol.

## 2022-04-27 ENCOUNTER — Emergency Department (HOSPITAL_COMMUNITY)
Admission: EM | Admit: 2022-04-27 | Discharge: 2022-04-27 | Disposition: A | Discharge status: Home / Self Care | Payer: Self-pay | Attending: Emergency Medicine | Admitting: Emergency Medicine

## 2022-04-27 DIAGNOSIS — G8929 Other chronic pain: Secondary | ICD-10-CM

## 2022-04-27 MED ORDER — IBUPROFEN 800 MG PO TABS
800.0000 mg | ORAL_TABLET | Freq: Once | ORAL | Status: AC
  Administered 2022-04-27: 800 mg via ORAL
  Filled 2022-04-27: qty 1

## 2022-04-27 MED ORDER — IBUPROFEN 800 MG PO TABS: 800.0000 mg | ORAL_TABLET | Freq: Three times a day (TID) | ORAL | 0 refills | Status: DC

## 2022-04-27 NOTE — ED Provider Notes (Signed)
[facility] Provider Note   CSN: [id number] Arrival date & time: 04/26/22  2242     History  Chief Complaint  Patient presents with   Knee Pain    [facility] is a 47 y.o. male.  Patient is a 47 year old male with past medical history of chronic alcoholism, chronic pain syndrome, hypertension.  Patient presenting today with complaints of left knee pain.  This has been an ongoing problem for quite some time.  He denies any new injury or trauma.  Is requesting medication for his pain.  The history is provided by the patient.       Home Medications Prior to Admission medications   Medication Sig Start Date End Date Taking? Authorizing Provider  ibuprofen (ADVIL) 800 MG tablet Take 1 tablet (800 mg total) by mouth 3 (three) times daily. 04/27/22  Yes Azha Constantin, Nathaneil Canary, MD  amLODipine (NORVASC) 5 MG tablet Take 1 tablet (5 mg total) by mouth daily. 06/22/21 07/22/21  Massengill, Ovid Curd, MD  diphenhydrAMINE (BENADRYL ALLERGY) 25 mg capsule Take 1 capsule (25 mg total) by mouth every 6 (six) hours as needed. 06/16/21   Cresenzo, Angelyn Punt, MD  etodolac (LODINE) 400 MG tablet Take 1 tablet (400 mg total) by mouth 2 (two) times daily. 04/02/22   Evlyn Courier, PA-C  famotidine (PEPCID) 20 MG tablet Take 1 tablet (20 mg total) by mouth 2 (two) times daily. Patient not taking: Reported on 06/14/2021 04/27/19   Milton Ferguson, MD  gabapentin (NEURONTIN) 100 MG capsule Take 1 capsule (100 mg total) by mouth 2 (two) times daily. 06/21/21 07/21/21  Massengill, Ovid Curd, MD  gabapentin (NEURONTIN) 100 MG capsule Take 2 capsules (200 mg total) by mouth at bedtime. 06/21/21 07/21/21  Massengill, Ovid Curd, MD  hydrochlorothiazide (HYDRODIURIL) 12.5 MG tablet Take 1 tablet (12.5 mg total) by mouth daily. 06/22/21 07/22/21  Massengill, Ovid Curd, MD  HYDROcodone-acetaminophen (NORCO/VICODIN) 5-325 MG tablet Take one tab po q 4 hrs prn pain 03/29/22   Triplett, Tammy, PA-C  loratadine  (CLARITIN REDITABS) 10 MG dissolvable tablet Take 1 tablet (10 mg total) by mouth daily. As needed for allergy symptoms 06/16/21   Cresenzo, Angelyn Punt, MD  naproxen (NAPROSYN) 500 MG tablet Take 1 tablet (500 mg total) by mouth 2 (two) times daily. 04/17/22   Maudie Flakes, MD  ondansetron (ZOFRAN-ODT) 8 MG disintegrating tablet 8mg  ODT q4 hours prn nausea 02/05/22   Veryl Speak, MD  pantoprazole (PROTONIX) 40 MG tablet Take 1 tablet (40 mg total) by mouth daily. 06/22/21 07/22/21  Massengill, Ovid Curd, MD  sertraline (ZOLOFT) 100 MG tablet Take 1 tablet (100 mg total) by mouth daily. 06/22/21 07/22/21  Massengill, Ovid Curd, MD  traZODone (DESYREL) 100 MG tablet Take 1 tablet (100 mg total) by mouth at bedtime as needed for sleep. 06/21/21 07/21/21  Massengill, Ovid Curd, MD      Allergies    Ace inhibitors and Onion    Review of Systems   Review of Systems  All other systems reviewed and are negative.   Physical Exam Updated Vital Signs BP (!) 124/91   Pulse 90   Temp 98.4 F (36.9 C) (Oral)   Resp 15   Ht 5\' 9"  (1.753 m)   Wt 86.2 kg   SpO2 95%   BMI 28.06 kg/m  Physical Exam Vitals and nursing note reviewed.  Constitutional:      Appearance: Normal appearance.  HENT:     Head: Normocephalic and atraumatic.  Pulmonary:  Effort: Pulmonary effort is normal.  Musculoskeletal:     Comments: The left knee appears grossly normal.  There is no swelling or effusion.  He has pain with range of motion, but no crepitus.  The knee appears stable.  Skin:    General: Skin is warm and dry.  Neurological:     Mental Status: He is alert.     ED Results / Procedures / Treatments   Labs (all labs ordered are listed, but only abnormal results are displayed) Labs Reviewed - No data to display  EKG None  Radiology No results found.  Procedures Procedures    Medications Ordered in ED Medications  ibuprofen (ADVIL) tablet 800 mg (800 mg Oral Given 04/27/22 0345)    ED Course/ Medical  Decision Making/ A&P  No x-rays indicated.  This is a chronic problem.  Patient will be given Motrin and discharged with the same.  To follow-up with PCP.  Final Clinical Impression(s) / ED Diagnoses Final diagnoses:  Chronic pain of left knee    Rx / DC Orders ED Discharge Orders          Ordered    ibuprofen (ADVIL) 800 MG tablet  3 times daily        04/27/22 0331              Veryl Speak, MD 04/27/22 [phone]

## 2022-04-27 NOTE — Discharge Instructions (Addendum)
Begin taking Motrin as prescribed.  Follow-up with your primary doctor if not improving.

## 2022-05-04 ENCOUNTER — Emergency Department (HOSPITAL_COMMUNITY)
Admission: EM | Admit: 2022-05-04 | Discharge: 2022-05-04 | Disposition: A | Discharge status: Home / Self Care | Payer: Self-pay | Attending: Emergency Medicine | Admitting: Emergency Medicine

## 2022-05-04 ENCOUNTER — Other Ambulatory Visit: Payer: Self-pay

## 2022-05-04 DIAGNOSIS — F1010 Alcohol abuse, uncomplicated: Secondary | ICD-10-CM | POA: Insufficient documentation

## 2022-05-04 DIAGNOSIS — F1490 Cocaine use, unspecified, uncomplicated: Secondary | ICD-10-CM | POA: Insufficient documentation

## 2022-05-04 DIAGNOSIS — R000 Tachycardia, unspecified: Secondary | ICD-10-CM | POA: Insufficient documentation

## 2022-05-04 DIAGNOSIS — Y908 Blood alcohol level of 240 mg/100 ml or more: Secondary | ICD-10-CM | POA: Insufficient documentation

## 2022-05-04 DIAGNOSIS — Z789 Other specified health status: Secondary | ICD-10-CM

## 2022-05-04 LAB — COMPREHENSIVE METABOLIC PANEL
ALT: 42 U/L (ref 0–44)
AST: 34 U/L (ref 15–41)
Albumin: 4.7 g/dL (ref 3.5–5.0)
Alkaline Phosphatase: 55 U/L (ref 38–126)
Anion gap: 10 (ref 5–15)
BUN: 5 mg/dL — ABNORMAL LOW (ref 6–20)
CO2: 23 mmol/L (ref 22–32)
Calcium: 8.4 mg/dL — ABNORMAL LOW (ref 8.9–10.3)
Chloride: 107 mmol/L (ref 98–111)
Creatinine, Ser: 1.03 mg/dL (ref 0.61–1.24)
GFR, Estimated: >60 mL/min (ref >60)
Glucose, Bld: 101 mg/dL — ABNORMAL HIGH (ref 70–99)
Potassium: 3.7 mmol/L (ref 3.5–5.1)
Sodium: 140 mmol/L (ref 135–145)
Total Bilirubin: 0.7 mg/dL (ref 0.3–1.2)
Total Protein: 7.8 g/dL (ref 6.5–8.1)

## 2022-05-04 LAB — CBC WITH DIFFERENTIAL/PLATELET
Abs Immature Granulocytes: 0.01 10*3/uL (ref 0.00–0.07)
Basophils Absolute: 0.1 10*3/uL (ref 0.0–0.1)
Basophils Relative: 1 %
Eosinophils Absolute: 0 10*3/uL (ref 0.0–0.5)
Eosinophils Relative: 0 %
HCT: 45.8 % (ref 39.0–52.0)
Hemoglobin: 15.9 g/dL (ref 13.0–17.0)
Immature Granulocytes: 0 %
Lymphocytes Relative: 45 %
Lymphs Abs: 3.2 10*3/uL (ref 0.7–4.0)
MCH: 31 pg (ref 26.0–34.0)
MCHC: 34.7 g/dL (ref 30.0–36.0)
MCV: 89.3 fL (ref 80.0–100.0)
Monocytes Absolute: 0.4 10*3/uL (ref 0.1–1.0)
Monocytes Relative: 6 %
Neutro Abs: 3.3 10*3/uL (ref 1.7–7.7)
Neutrophils Relative %: 48 %
Platelets: 247 10*3/uL (ref 150–400)
RBC: 5.13 MIL/uL (ref 4.22–5.81)
RDW: 13.2 % (ref 11.5–15.5)
WBC: 7 10*3/uL (ref 4.0–10.5)
nRBC: 0 % (ref 0.0–0.2)

## 2022-05-04 LAB — RAPID URINE DRUG SCREEN, HOSP PERFORMED
Amphetamines: NOT DETECTED
Barbiturates: NOT DETECTED
Benzodiazepines: NOT DETECTED
Cocaine: POSITIVE — AB
Opiates: NOT DETECTED
Tetrahydrocannabinol: NOT DETECTED

## 2022-05-04 LAB — ACETAMINOPHEN LEVEL: Acetaminophen (Tylenol), Serum: <10 ug/mL — ABNORMAL LOW (ref 10–30)

## 2022-05-04 LAB — ETHANOL: Alcohol, Ethyl (B): 249 mg/dL — ABNORMAL HIGH (ref <10)

## 2022-05-04 LAB — SALICYLATE LEVEL: Salicylate Lvl: <7 mg/dL — ABNORMAL LOW (ref 7.0–30.0)

## 2022-05-04 MED ORDER — LACTATED RINGERS IV BOLUS
1000.0000 mL | Freq: Once | INTRAVENOUS | Status: AC
  Administered 2022-05-04: 1000 mL via INTRAVENOUS

## 2022-05-04 MED ORDER — CHLORDIAZEPOXIDE HCL 25 MG PO CAPS
50.0000 mg | ORAL_CAPSULE | Freq: Once | ORAL | Status: AC
  Administered 2022-05-04: 50 mg via ORAL
  Filled 2022-05-04: qty 2

## 2022-05-04 MED ORDER — CHLORDIAZEPOXIDE HCL 25 MG PO CAPS: ORAL_CAPSULE | ORAL | 0 refills | Status: DC

## 2022-05-04 NOTE — ED Triage Notes (Signed)
Pt states he wants to stop drinking and needs help to do so. Pt calm and cooperative in triage.

## 2022-05-04 NOTE — ED Notes (Signed)
Went into Patients room and he said he was ready to leave and didn't want any resources.
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
## 2022-05-04 NOTE — ED Provider Notes (Signed)
Emergency Medicine Observation Re-evaluation Note  Paul Lambert is a 47 y.o. male, seen on rounds today.  Pt initially presented to the ED for complaints of Alcohol Problem Currently, the patient is awake, alert and answering questions appropriately.  Physical Exam  BP (!) 132/105   Pulse 90   Temp 98.5 F (36.9 C)   Resp 18   Ht 5\' 9"  (1.753 m)   Wt 86.2 kg   SpO2 97%   BMI 28.06 kg/m  Physical Exam Vitals and nursing note reviewed.  Constitutional:      General: He is not in acute distress.    Appearance: He is well-developed.  HENT:     Head: Normocephalic and atraumatic.  Eyes:     Conjunctiva/sclera: Conjunctivae normal.  Cardiovascular:     Rate and Rhythm: Normal rate and regular rhythm.     Heart sounds: No murmur heard. Pulmonary:     Effort: Pulmonary effort is normal. No respiratory distress.  Musculoskeletal:        General: No swelling.     Cervical back: Neck supple.  Skin:    General: Skin is warm and dry.  Neurological:     Mental Status: He is alert.  Psychiatric:        Mood and Affect: Mood normal.      ED Course / MDM  EKG:EKG Interpretation  Date/Time:  Wednesday May 04 2022 01:34:59 EDT Ventricular Rate:  123 PR Interval:  141 QRS Duration: 73 QT Interval:  327 QTC Calculation: 468 R Axis:   72 Text Interpretation: Sinus tachycardia Low voltage, precordial leads Confirmed by Merrily Pew [phone]) on 05/04/2022 2:19:18 AM  I have reviewed the labs performed to date as well as medications administered while in observation.  Recent changes in the last 24 hours include patient no longer intoxicated, not displaying signs of psychosis and is requesting discharge.  I gave him a prescription for Librium and instructed him to follow-up with [facility] for alcohol use resources.  He is currently denying SI, HI auditory visual hallucinations.  He does not want a wait for TTS evaluation.  As he is not under IVC, patient cannot be held in the emergency  department and ultimately left the emergency department Plan  Current plan is for discharge.    Teressa Lower, MD 05/04/22 [phone]

## 2022-05-04 NOTE — ED Notes (Signed)
I attempted to get another set of vital signs and provide patient with discharge paperwork. He said he didn't need anything and was leaving

## 2022-05-10 DIAGNOSIS — G8929 Other chronic pain: Secondary | ICD-10-CM | POA: Diagnosis not present

## 2022-05-10 DIAGNOSIS — R000 Tachycardia, unspecified: Secondary | ICD-10-CM | POA: Insufficient documentation

## 2022-05-10 DIAGNOSIS — I10 Essential (primary) hypertension: Secondary | ICD-10-CM | POA: Diagnosis not present

## 2022-05-10 DIAGNOSIS — M25562 Pain in left knee: Secondary | ICD-10-CM | POA: Diagnosis not present

## 2022-05-10 DIAGNOSIS — Z79899 Other long term (current) drug therapy: Secondary | ICD-10-CM | POA: Insufficient documentation

## 2022-05-11 ENCOUNTER — Emergency Department (HOSPITAL_COMMUNITY)
Admission: EM | Admit: 2022-05-11 | Discharge: 2022-05-11 | Disposition: A | Discharge status: Home / Self Care | Payer: Self-pay | Attending: Emergency Medicine | Admitting: Emergency Medicine

## 2022-05-11 ENCOUNTER — Other Ambulatory Visit: Payer: Self-pay

## 2022-05-11 DIAGNOSIS — G8929 Other chronic pain: Secondary | ICD-10-CM

## 2022-05-11 NOTE — ED Provider Notes (Signed)
[place] Provider Note   CSN: [id number] Arrival date & time: 05/10/22  2344     History  Chief Complaint  Patient presents with   Knee Pain    JOHNTAY BRANDES is a 47 y.o. male.  HPI     This is a 47 year old male who presents requesting replacement left knee immobilizer.  Patient has been seen and evaluated multiple times for ongoing and chronic knee pain.  He states pain over the medial left knee mostly with ambulation.  He has not followed up with orthopedics and when asked why he states "I have got a lot of stuff going on."  No new changes.  No recent trauma.  Home Medications Prior to Admission medications   Medication Sig Start Date End Date Taking? Authorizing Provider  amLODipine (NORVASC) 5 MG tablet Take 1 tablet (5 mg total) by mouth daily. 06/22/21 07/22/21  Massengill, Ovid Curd, MD  chlordiazePOXIDE (LIBRIUM) 25 MG capsule 50mg  PO TID x 1D, then 25-50mg  PO BID X 1D, then 25-50mg  PO QD X 1D 05/04/22   Kommor, Madison, MD  diphenhydrAMINE (BENADRYL ALLERGY) 25 mg capsule Take 1 capsule (25 mg total) by mouth every 6 (six) hours as needed. Patient not taking: Reported on 05/04/2022 06/16/21   Concepcion Living, MD  etodolac (LODINE) 400 MG tablet Take 1 tablet (400 mg total) by mouth 2 (two) times daily. Patient not taking: Reported on 05/04/2022 04/02/22   Evlyn Courier, PA-C  famotidine (PEPCID) 20 MG tablet Take 1 tablet (20 mg total) by mouth 2 (two) times daily. Patient not taking: Reported on 06/14/2021 04/27/19   Milton Ferguson, MD  gabapentin (NEURONTIN) 100 MG capsule Take 1 capsule (100 mg total) by mouth 2 (two) times daily. 06/21/21 07/21/21  Massengill, Ovid Curd, MD  gabapentin (NEURONTIN) 100 MG capsule Take 2 capsules (200 mg total) by mouth at bedtime. 06/21/21 07/21/21  Massengill, Ovid Curd, MD  hydrochlorothiazide (HYDRODIURIL) 12.5 MG tablet Take 1 tablet (12.5 mg total) by mouth daily. 06/22/21 07/22/21  Massengill, Ovid Curd, MD   HYDROcodone-acetaminophen (NORCO/VICODIN) 5-325 MG tablet Take one tab po q 4 hrs prn pain Patient not taking: Reported on 05/04/2022 03/29/22   Triplett, Tammy, PA-C  ibuprofen (ADVIL) 800 MG tablet Take 1 tablet (800 mg total) by mouth 3 (three) times daily. 04/27/22   Veryl Speak, MD  loratadine (CLARITIN REDITABS) 10 MG dissolvable tablet Take 1 tablet (10 mg total) by mouth daily. As needed for allergy symptoms Patient not taking: Reported on 05/04/2022 06/16/21   Concepcion Living, MD  naproxen (NAPROSYN) 500 MG tablet Take 1 tablet (500 mg total) by mouth 2 (two) times daily. 04/17/22   Maudie Flakes, MD  ondansetron (ZOFRAN-ODT) 8 MG disintegrating tablet 8mg  ODT q4 hours prn nausea Patient not taking: Reported on 05/04/2022 02/05/22   Veryl Speak, MD  pantoprazole (PROTONIX) 40 MG tablet Take 1 tablet (40 mg total) by mouth daily. 06/22/21 07/22/21  Massengill, Ovid Curd, MD  sertraline (ZOLOFT) 100 MG tablet Take 1 tablet (100 mg total) by mouth daily. 06/22/21 07/22/21  Massengill, Ovid Curd, MD  traZODone (DESYREL) 100 MG tablet Take 1 tablet (100 mg total) by mouth at bedtime as needed for sleep. 06/21/21 07/21/21  Massengill, Ovid Curd, MD      Allergies    Ace inhibitors and Onion    Review of Systems   Review of Systems  Musculoskeletal:        Knee pain  All other systems reviewed and are  negative.   Physical Exam Updated Vital Signs BP (!) 129/95   Pulse (!) 120   Temp 98.3 F (36.8 C)   Resp 18   Ht 1.753 m (5\' 9" ) Comment: Simultaneous filing. User may not have seen previous data.  Wt 86.2 kg Comment: Simultaneous filing. User may not have seen previous data.  SpO2 95%   BMI 28.06 kg/m  Physical Exam Vitals and nursing note reviewed.  Constitutional:      Appearance: He is well-developed. He is not ill-appearing.  HENT:     Head: Normocephalic and atraumatic.  Eyes:     Pupils: Pupils are equal, round, and reactive to light.  Cardiovascular:     Rate and Rhythm:  Regular rhythm. Tachycardia present.  Pulmonary:     Effort: Pulmonary effort is normal. No respiratory distress.  Abdominal:     Palpations: Abdomen is soft.  Musculoskeletal:     Cervical back: Neck supple.     Comments: Pains with range of motion of the left knee, no obvious overlying skin changes, tenderness palpation medial along the joint line  Lymphadenopathy:     Cervical: No cervical adenopathy.  Skin:    General: Skin is warm and dry.  Neurological:     Mental Status: He is alert and oriented to person, place, and time.  Psychiatric:        Mood and Affect: Mood normal.     ED Results / Procedures / Treatments   Labs (all labs ordered are listed, but only abnormal results are displayed) Labs Reviewed - No data to display  EKG None  Radiology No results found.  Procedures Procedures    Medications Ordered in ED Medications - No data to display  ED Course/ Medical Decision Making/ A&P                             Medical Decision Making  This patient presents to the ED for concern of knee pain, this involves an extensive number of treatment options, and is a complaint that carries with it a high risk of complications and morbidity.  I considered the following differential and admission for this acute, potentially life threatening condition.  The differential diagnosis includes chronic knee pain, acute pain or arthritis, meniscus or ligamentous injury  MDM:    This is a 47 year old male who presents requesting replacement for his left knee brace.  He states the brace broke.  He is nontoxic and vital signs are notable for heart rate of 120.  He has a significant history of alcohol and cocaine abuse.  He is awake, alert, oriented.  He is ambulatory independently and appears to have capacity.  He does not endorse any new symptoms.  I have reviewed his chart.  Last had x-ray imaging on 3/9 which revealed a small effusion.  Encouraged him to follow-up with orthopedics  as previously recommended.  Replacement knee brace was provided.  Do not feel he needs further workup at this time.  (Labs, imaging, consults)  Labs: I Ordered, and personally interpreted labs.  The pertinent results include: None  Imaging Studies ordered: I ordered imaging studies including none I independently visualized and interpreted imaging. I agree with the radiologist interpretation  Additional history obtained from review.  External records from outside source obtained and reviewed including imaging studies  Cardiac Monitoring: The patient N/A maintained on a cardiac monitor.  If on the cardiac monitor, I personally viewed and interpreted  the cardiac monitored which showed an underlying rhythm of: N/A  Reevaluation: After the interventions noted above, I reevaluated the patient and found that they have :stayed the same  Social Determinants of Health:  substance abuse history  Disposition: Discharge  Co morbidities that complicate the patient evaluation  Past Medical History:  Diagnosis Date   Cocaine abuse ([place])    ETOH abuse    Hypertension    Seizures ([place])    Substance abuse ([place])      Medicines No orders of the defined types were placed in this encounter.   I have reviewed the patients home medicines and have made adjustments as needed  Problem List / ED Course: Problem List Items Addressed This Visit   None Visit Diagnoses     Chronic pain of left knee    -  Primary                   Final Clinical Impression(s) / ED Diagnoses Final diagnoses:  Chronic pain of left knee    Rx / DC Orders ED Discharge Orders     None         Merryl Hacker, MD 05/11/22 0015

## 2022-05-11 NOTE — ED Notes (Signed)
Patient verbalizes understanding of discharge instructions. Opportunity for questioning and answers were provided. Armband removed by staff, pt discharged from ED. Ambulated out to lobby  

## 2022-05-19 ENCOUNTER — Emergency Department (HOSPITAL_COMMUNITY): Admission: EM | Admit: 2022-05-19 | Discharge: 2022-05-19 | Discharge status: Against Medical Advice | Payer: Self-pay

## 2022-05-19 ENCOUNTER — Emergency Department (HOSPITAL_COMMUNITY)
Admission: EM | Admit: 2022-05-19 | Discharge: 2022-05-20 | Discharge status: Against Medical Advice | Payer: Self-pay | Source: Home / Self Care

## 2022-05-19 NOTE — ED Notes (Signed)
Pt not in lobby- security informed this nurse that this pt left walking with Ladene Artist.

## 2022-05-28 ENCOUNTER — Emergency Department (HOSPITAL_COMMUNITY)
Admission: EM | Admit: 2022-05-28 | Discharge: 2022-05-29 | Disposition: A | Discharge status: Home / Self Care | Payer: Self-pay | Attending: Emergency Medicine | Admitting: Emergency Medicine

## 2022-05-28 DIAGNOSIS — R000 Tachycardia, unspecified: Secondary | ICD-10-CM | POA: Insufficient documentation

## 2022-05-28 DIAGNOSIS — I10 Essential (primary) hypertension: Secondary | ICD-10-CM | POA: Insufficient documentation

## 2022-05-28 DIAGNOSIS — G8929 Other chronic pain: Secondary | ICD-10-CM | POA: Insufficient documentation

## 2022-05-28 DIAGNOSIS — M25562 Pain in left knee: Secondary | ICD-10-CM | POA: Diagnosis not present

## 2022-05-28 DIAGNOSIS — Z79899 Other long term (current) drug therapy: Secondary | ICD-10-CM | POA: Insufficient documentation

## 2022-05-28 MED ORDER — IBUPROFEN 800 MG PO TABS
800.0000 mg | ORAL_TABLET | Freq: Once | ORAL | Status: AC
  Administered 2022-05-29: 800 mg via ORAL
  Filled 2022-05-28: qty 1

## 2022-05-28 NOTE — Discharge Instructions (Addendum)
You were seen today for knee pain.  It is important that you wear your knee brace.  Take ibuprofen as needed for pain and follow-up with orthopedics.  Regarding your blood pressure, you need to make sure that you are taking your blood pressure medications as prescribed.

## 2022-05-28 NOTE — ED Provider Notes (Signed)
[place] EMERGENCY DEPARTMENT AT [facility] Provider Note   CSN: [id number] Arrival date & time: 05/28/22  2247     History  Chief Complaint  Patient presents with   Knee Pain    Paul Lambert is a 47 y.o. male.  HPI     This is a 47 year old male who presents with ongoing left knee pain.  When asked what brought him in tonight he states "my knee still hurts to talk."  He has not followed up with orthopedics as previously instructed.  Denies any other changes.  His knee brace is into his sister's car.  No recent injury.  Patient also states that "my blood pressure is high."  Denies chest pain, shortness of breath, symptoms related to his blood pressure.  He is supposed to take blood pressure medications but has not been taking them.  Denies fevers.  Home Medications Prior to Admission medications   Medication Sig Start Date End Date Taking? Authorizing Provider  amLODipine (NORVASC) 5 MG tablet Take 1 tablet (5 mg total) by mouth daily. 06/22/21 07/22/21  Massengill, Harrold Donath, MD  chlordiazePOXIDE (LIBRIUM) 25 MG capsule  PO TID x 1D, then 25-50mg  PO BID X 1D, then 25-50mg  PO QD X 1D 05/04/22   Kommor, Madison, MD  diphenhydrAMINE (BENADRYL ALLERGY) 25 mg capsule Take 1 capsule (25 mg total) by mouth every 6 (six) hours as needed. Patient not taking: Reported on 05/04/2022 06/16/21   Celedonio Savage, MD  etodolac (LODINE) 400 MG tablet Take 1 tablet (400 mg total) by mouth 2 (two) times daily. Patient not taking: Reported on 05/04/2022 04/02/22   Marita Kansas, PA-C  famotidine (PEPCID) 20 MG tablet Take 1 tablet (20 mg total) by mouth 2 (two) times daily. Patient not taking: Reported on 06/14/2021 04/27/19   Bethann Berkshire, MD  gabapentin (NEURONTIN) 100 MG capsule Take 1 capsule (100 mg total) by mouth 2 (two) times daily. 06/21/21 07/21/21  Massengill, Harrold Donath, MD  gabapentin (NEURONTIN) 100 MG capsule Take 2 capsules (200 mg total) by mouth at bedtime. 06/21/21 07/21/21   Massengill, Harrold Donath, MD  hydrochlorothiazide (HYDRODIURIL) 12.5 MG tablet Take 1 tablet (12.5 mg total) by mouth daily. 06/22/21 07/22/21  Massengill, Harrold Donath, MD  HYDROcodone-acetaminophen (NORCO/VICODIN) 5-325 MG tablet Take one tab po q 4 hrs prn pain Patient not taking: Reported on 05/04/2022 03/29/22   Triplett, Tammy, PA-C  ibuprofen (ADVIL) 800 MG tablet Take 1 tablet (800 mg total) by mouth 3 (three) times daily. 04/27/22   Paul Lyons, MD  loratadine (CLARITIN REDITABS) 10 MG dissolvable tablet Take 1 tablet (10 mg total) by mouth daily. As needed for allergy symptoms Patient not taking: Reported on 05/04/2022 06/16/21   Celedonio Savage, MD  naproxen (NAPROSYN) 500 MG tablet Take 1 tablet (500 mg total) by mouth 2 (two) times daily. 04/17/22   Sabas Sous, MD  ondansetron (ZOFRAN-ODT) 8 MG disintegrating tablet  ODT q4 hours prn nausea Patient not taking: Reported on 05/04/2022 02/05/22   Paul Lyons, MD  pantoprazole (PROTONIX) 40 MG tablet Take 1 tablet (40 mg total) by mouth daily. 06/22/21 07/22/21  Massengill, Harrold Donath, MD  sertraline (ZOLOFT) 100 MG tablet Take 1 tablet (100 mg total) by mouth daily. 06/22/21 07/22/21  Massengill, Harrold Donath, MD  traZODone (DESYREL) 100 MG tablet Take 1 tablet (100 mg total) by mouth at bedtime as needed for sleep. 06/21/21 07/21/21  Phineas Inches, MD      Allergies    Ace inhibitors and Onion  Review of Systems   Review of Systems  Constitutional:  Negative for fever.  Cardiovascular:  Negative for chest pain.  Musculoskeletal:        Knee pain  All other systems reviewed and are negative.   Physical Exam Updated Vital Signs BP (!) 155/95   Pulse (!) 109   Temp 98.8 F (37.1 C) (Oral)   Resp 17   SpO2 96%  Physical Exam Vitals and nursing note reviewed.  Constitutional:      Appearance: He is well-developed. He is not ill-appearing.  HENT:     Head: Normocephalic and atraumatic.  Eyes:     Pupils: Pupils are equal, round, and  reactive to light.  Cardiovascular:     Rate and Rhythm: Regular rhythm. Tachycardia present.  Pulmonary:     Effort: Pulmonary effort is normal. No respiratory distress.  Abdominal:     Palpations: Abdomen is soft.     Tenderness: There is no abdominal tenderness. There is no rebound.  Musculoskeletal:     Cervical back: Neck supple.     Comments: Left knee with normal range of motion, no overlying skin changes, tenderness palpation over the medial joint line with small effusion noted, drawer testing intact, neurovascularly intact  Lymphadenopathy:     Cervical: No cervical adenopathy.  Skin:    General: Skin is warm and dry.  Neurological:     Mental Status: He is alert and oriented to person, place, and time.  Psychiatric:        Mood and Affect: Mood normal.     ED Results / Procedures / Treatments   Labs (all labs ordered are listed, but only abnormal results are displayed) Labs Reviewed - No data to display  EKG None  Radiology No results found.  Procedures Procedures    Medications Ordered in ED Medications  ibuprofen (ADVIL) tablet 800 mg (has no administration in time range)    ED Course/ Medical Decision Making/ A&P                             Medical Decision Making Risk Prescription drug management.   This patient presents to the ED for concern of knee pain, this involves an extensive number of treatment options, and is a complaint that carries with it a high risk of complications and morbidity.  I considered the following differential and admission for this acute, potentially life threatening condition.  The differential diagnosis includes chronic pain, inflammatory arthritis such as gout, old injury such as meniscal injury or ligamentous injury, less likely fracture  MDM:    This is a 47 year old male who is well-known to [place] who presents with ongoing left knee pain.  He has been seen [place] the mall evaluated multiple times for this.  Last x-rays were  approximately 1 month ago.  Denies any new injuries.  He has normal range of motion and no overlying skin changes.  Joint is not hot and does not suggest septic arthritis.  He may have an inflammatory arthritis; however, treatment would be the same.  Do not feel he needs repeat imaging or arthrocentesis at this time.  I again reinforced RICE therapy.  He needs to get his brace from his sister and follow-up with orthopedics.  Regarding his blood pressure was 155/95.  He has no complaints regarding his blood pressure.  Doubt hypertensive urgency or emergency.  He has blood pressure medications at home.  Again reinforced that he  needs to make sure that he is taking these.  (Labs, imaging, consults)  Labs: I Ordered, and personally interpreted labs.  The pertinent results include: None  Imaging Studies ordered: I ordered imaging studies including none I independently visualized and interpreted imaging. I agree with the radiologist interpretation  Additional history obtained from chart review.  External records from outside source obtained and reviewed including prior evaluations  Cardiac Monitoring: The patient was not maintained on a cardiac monitor.  If on the cardiac monitor, I personally viewed and interpreted the cardiac monitored which showed an underlying rhythm of: N/A  Reevaluation: After the interventions noted above, I reevaluated the patient and found that they have :stayed the same  Social Determinants of Health:  lives independently  Disposition: Discharge  Co morbidities that complicate the patient evaluation  Past Medical History:  Diagnosis Date   Cocaine abuse ([facility])    ETOH abuse    Hypertension    Seizures ([facility])    Substance abuse ([facility])      Medicines Meds ordered this encounter  Medications   ibuprofen (ADVIL) tablet 800 mg    I have reviewed the patients home medicines and have made adjustments as needed  Problem List / ED Course: Problem List Items  Addressed This Visit   None Visit Diagnoses     Chronic pain of left knee    -  Primary   Relevant Medications   ibuprofen (ADVIL) tablet 800 mg   Uncontrolled hypertension                       Final Clinical Impression(s) / ED Diagnoses Final diagnoses:  Chronic pain of left knee  Uncontrolled hypertension    Rx / DC Orders ED Discharge Orders     None         Shon Baton, MD 05/28/22 2348

## 2022-05-28 NOTE — ED Triage Notes (Signed)
Pt c/o left knee pain, states he needs another knee brace.

## 2022-06-12 ENCOUNTER — Emergency Department (HOSPITAL_COMMUNITY)
Admission: EM | Admit: 2022-06-12 | Discharge: 2022-06-12 | Disposition: A | Discharge status: Home / Self Care | Payer: Medicaid Other | Attending: Emergency Medicine | Admitting: Emergency Medicine

## 2022-06-12 ENCOUNTER — Emergency Department (HOSPITAL_COMMUNITY): Payer: Medicaid Other

## 2022-06-12 ENCOUNTER — Other Ambulatory Visit: Payer: Self-pay

## 2022-06-12 DIAGNOSIS — R000 Tachycardia, unspecified: Secondary | ICD-10-CM

## 2022-06-12 DIAGNOSIS — M25562 Pain in left knee: Secondary | ICD-10-CM | POA: Diagnosis not present

## 2022-06-12 DIAGNOSIS — I10 Essential (primary) hypertension: Secondary | ICD-10-CM | POA: Diagnosis not present

## 2022-06-12 DIAGNOSIS — M25552 Pain in left hip: Secondary | ICD-10-CM | POA: Diagnosis not present

## 2022-06-12 LAB — CBC WITH DIFFERENTIAL/PLATELET
Abs Immature Granulocytes: 0.01 10*3/uL (ref 0.00–0.07)
Basophils Absolute: 0.1 10*3/uL (ref 0.0–0.1)
Basophils Relative: 2 %
Eosinophils Absolute: 0 10*3/uL (ref 0.0–0.5)
Eosinophils Relative: 0 %
HCT: 39.6 % (ref 39.0–52.0)
Hemoglobin: 13.5 g/dL (ref 13.0–17.0)
Immature Granulocytes: 0 %
Lymphocytes Relative: 39 %
Lymphs Abs: 2 10*3/uL (ref 0.7–4.0)
MCH: 31.5 pg (ref 26.0–34.0)
MCHC: 34.1 g/dL (ref 30.0–36.0)
MCV: 92.3 fL (ref 80.0–100.0)
Monocytes Absolute: 0.4 10*3/uL (ref 0.1–1.0)
Monocytes Relative: 8 %
Neutro Abs: 2.7 10*3/uL (ref 1.7–7.7)
Neutrophils Relative %: 51 %
Platelets: 193 10*3/uL (ref 150–400)
RBC: 4.29 MIL/uL (ref 4.22–5.81)
RDW: 15.6 % — ABNORMAL HIGH (ref 11.5–15.5)
WBC: 5.2 10*3/uL (ref 4.0–10.5)
nRBC: 0 % (ref 0.0–0.2)

## 2022-06-12 LAB — COMPREHENSIVE METABOLIC PANEL
ALT: 42 U/L (ref 0–44)
AST: 40 U/L (ref 15–41)
Albumin: 4.1 g/dL (ref 3.5–5.0)
Alkaline Phosphatase: 58 U/L (ref 38–126)
Anion gap: 11 (ref 5–15)
BUN: 8 mg/dL (ref 6–20)
CO2: 22 mmol/L (ref 22–32)
Calcium: 8.3 mg/dL — ABNORMAL LOW (ref 8.9–10.3)
Chloride: 104 mmol/L (ref 98–111)
Creatinine, Ser: 1.01 mg/dL (ref 0.61–1.24)
GFR, Estimated: >60 mL/min (ref >60)
Glucose, Bld: 88 mg/dL (ref 70–99)
Potassium: 3.7 mmol/L (ref 3.5–5.1)
Sodium: 137 mmol/L (ref 135–145)
Total Bilirubin: 0.8 mg/dL (ref 0.3–1.2)
Total Protein: 7.1 g/dL (ref 6.5–8.1)

## 2022-06-12 MED ORDER — KETOROLAC TROMETHAMINE 30 MG/ML IJ SOLN
30.0000 mg | Freq: Once | INTRAMUSCULAR | Status: AC
  Administered 2022-06-12: 30 mg via INTRAVENOUS
  Filled 2022-06-12: qty 1

## 2022-06-12 MED ORDER — SODIUM CHLORIDE 0.9 % IV BOLUS
1000.0000 mL | Freq: Once | INTRAVENOUS | Status: AC
  Administered 2022-06-12: 1000 mL via INTRAVENOUS

## 2022-06-12 MED ORDER — NAPROXEN 500 MG PO TABS: 500.0000 mg | ORAL_TABLET | Freq: Two times a day (BID) | ORAL | 0 refills | Status: DC

## 2022-06-12 NOTE — ED Notes (Signed)
Patient transported to X-ray 

## 2022-06-12 NOTE — Discharge Instructions (Addendum)
Apply ice for 30 minutes at a time, 4 times a day.  If you need additional pain relief, you may take acetaminophen as needed.  Please follow-up with the orthopedic physician for further evaluation and treatment.  Make sure that you are drinking enough fluids.  Your heart rate was very high at when you got here and that was likely due to some degree of dehydration.

## 2022-06-12 NOTE — ED Notes (Signed)
Patient returned from X-ray 

## 2022-06-12 NOTE — ED Triage Notes (Signed)
P/t c/o left knee pain & general not feeling well, thinks his bp may be up.

## 2022-06-12 NOTE — ED Provider Notes (Signed)
[place] EMERGENCY DEPARTMENT AT [facility] Provider Note   CSN: [id number] Arrival date & time: 06/12/22  0158     History  Chief Complaint  Patient presents with   Knee Pain    Paul Lambert is a 47 y.o. male.  The history is provided by the patient.  Knee Pain He has history of hypertension, substance abuse, seizures and comes in complaining of ongoing pain in his left knee.  He has been seen in the emergency department multiple times for left knee pain and was referred to orthopedics but states that he was unable to go to the physician because of insurance.  He states he now has Medicaid.  He has been taking ibuprofen and using an knee brace without any relief.   Home Medications Prior to Admission medications   Medication Sig Start Date End Date Taking? Authorizing Provider  amLODipine (NORVASC) 5 MG tablet Take 1 tablet (5 mg total) by mouth daily. 06/22/21 07/22/21  Massengill, Harrold Donath, MD  chlordiazePOXIDE (LIBRIUM) 25 MG capsule 50mg  PO TID x 1D, then 25-50mg  PO BID X 1D, then 25-50mg  PO QD X 1D 05/04/22   Kommor, Madison, MD  diphenhydrAMINE (BENADRYL ALLERGY) 25 mg capsule Take 1 capsule (25 mg total) by mouth every 6 (six) hours as needed. Patient not taking: Reported on 05/04/2022 06/16/21   Celedonio Savage, MD  etodolac (LODINE) 400 MG tablet Take 1 tablet (400 mg total) by mouth 2 (two) times daily. Patient not taking: Reported on 05/04/2022 04/02/22   Marita Kansas, PA-C  famotidine (PEPCID) 20 MG tablet Take 1 tablet (20 mg total) by mouth 2 (two) times daily. Patient not taking: Reported on 06/14/2021 04/27/19   Bethann Berkshire, MD  gabapentin (NEURONTIN) 100 MG capsule Take 1 capsule (100 mg total) by mouth 2 (two) times daily. 06/21/21 07/21/21  Massengill, Harrold Donath, MD  gabapentin (NEURONTIN) 100 MG capsule Take 2 capsules (200 mg total) by mouth at bedtime. 06/21/21 07/21/21  Massengill, Harrold Donath, MD  hydrochlorothiazide (HYDRODIURIL) 12.5 MG tablet Take 1 tablet (12.5 mg  total) by mouth daily. 06/22/21 07/22/21  Massengill, Harrold Donath, MD  HYDROcodone-acetaminophen (NORCO/VICODIN) 5-325 MG tablet Take one tab po q 4 hrs prn pain Patient not taking: Reported on 05/04/2022 03/29/22   Triplett, Tammy, PA-C  ibuprofen (ADVIL) 800 MG tablet Take 1 tablet (800 mg total) by mouth 3 (three) times daily. 04/27/22   Geoffery Lyons, MD  loratadine (CLARITIN REDITABS) 10 MG dissolvable tablet Take 1 tablet (10 mg total) by mouth daily. As needed for allergy symptoms Patient not taking: Reported on 05/04/2022 06/16/21   Celedonio Savage, MD  naproxen (NAPROSYN) 500 MG tablet Take 1 tablet (500 mg total) by mouth 2 (two) times daily. 04/17/22   Sabas Sous, MD  ondansetron (ZOFRAN-ODT) 8 MG disintegrating tablet 8mg  ODT q4 hours prn nausea Patient not taking: Reported on 05/04/2022 02/05/22   Geoffery Lyons, MD  pantoprazole (PROTONIX) 40 MG tablet Take 1 tablet (40 mg total) by mouth daily. 06/22/21 07/22/21  Massengill, Harrold Donath, MD  sertraline (ZOLOFT) 100 MG tablet Take 1 tablet (100 mg total) by mouth daily. 06/22/21 07/22/21  Massengill, Harrold Donath, MD  traZODone (DESYREL) 100 MG tablet Take 1 tablet (100 mg total) by mouth at bedtime as needed for sleep. 06/21/21 07/21/21  Massengill, Harrold Donath, MD      Allergies    Ace inhibitors and Onion    Review of Systems   Review of Systems  All other systems reviewed and are negative.  Physical Exam Updated Vital Signs BP (!) 148/106   Pulse (!) 130   Temp 98.2 F (36.8 C) (Oral)   Resp 17   Ht 5\' 9"  (1.753 m)   Wt 81.6 kg   SpO2 97%   BMI 26.58 kg/m  Physical Exam Vitals and nursing note reviewed.   47 year old male, resting comfortably and in no acute distress. Vital signs are significant for elevated blood pressure and elevated heart rate. Oxygen saturation is 97%, which is normal. Head is normocephalic and atraumatic. PERRLA, EOMI. Oropharynx is clear. Neck is nontender and supple without adenopathy or JVD. Back is nontender and  there is no CVA tenderness. Lungs are clear without rales, wheezes, or rhonchi. Chest is nontender. Heart is tachycardic without murmur. Abdomen is soft, flat, nontender. Extremities: There is no swelling or deformity noted of the left knee and full range of motion is present but he resists exam.  No obvious instability on valgus or varus stress and Lachman test is negative.  Unable to do an adequate McMurray's test because of patient resistance.  There is also some pain elicited with logroll and passive range of motion of the left hip. Skin is warm and dry without rash. Neurologic: Mental status is normal, cranial nerves are intact, moves all extremities equally.  ED Results / Procedures / Treatments   Labs (all labs ordered are listed, but only abnormal results are displayed) Labs Reviewed  CBC WITH DIFFERENTIAL/PLATELET - Abnormal; Notable for the following components:      Result Value   RDW 15.6 (*)    All other components within normal limits  COMPREHENSIVE METABOLIC PANEL - Abnormal; Notable for the following components:   Calcium 8.3 (*)    All other components within normal limits   Radiology DG HIP UNILAT WITH PELVIS 2-3 VIEWS LEFT  Result Date: 06/12/2022 CLINICAL DATA:  47 year old male history of injury from a fall complaining of left hip pain. EXAM: DG HIP (WITH OR WITHOUT PELVIS) 2-3V LEFT COMPARISON:  No priors. FINDINGS: Three views of the bony pelvis in the left hip demonstrate no acute displaced fracture of the bony pelvic ring. Bilateral proximal femurs as visualized appear intact and the left femoral head is properly located. There is extensive joint space narrowing, subchondral sclerosis, subchondral cyst formation and osteophyte formation associated with both hip joints, compatible with advanced osteoarthritis. IMPRESSION: 1. No acute radiographic abnormality of the bony pelvis or the left hip. 2. Bilateral hip joint osteoarthritis. Electronically Signed   By: Trudie Reed M.D.   On: 06/12/2022 06:03    Procedures Procedures    Medications Ordered in ED Medications - No data to display  ED Course/ Medical Decision Making/ A&P                             Medical Decision Making Amount and/or Complexity of Data Reviewed Labs: ordered. Radiology: ordered.  Risk Prescription drug management.   Ongoing left knee pain with essentially negative exam.  I have reviewed his past records, and he has 7 prior ED visits for left knee pain dating back to 03/29/2022 at which time he states that pain started when he fell while chasing his dog.  He had negative knee x-rays on 03/29/2022 and 04/16/2022.  He also had x-rays of his left foot and ankle done on 04/16/2022, and these were negative.  I am concerned today about ongoing knee pain and also rather significant tachycardia.  He has had significant tachycardia and his last 3 ED visits on 3/27, 4/3, 4/20.  I have ordered x-rays of the left hip to rule out referred pain from a hip injury.  I have ordered IV fluids as well as a CBC and comprehensive metabolic panel.  I have ordered a dose of ketorolac.  Hip x-rays are negative except for mild degenerative changes.  Have independently viewed the images, and agree with the radiologist's interpretation.  I have reviewed and interpreted his laboratory test, my interpretation is normal CBC and normal comprehensive metabolic panel.  Following ketorolac, his pain was improved somewhat.  Following IV fluids, heart rate had come down to 107 which is still elevated but only mildly elevated.  He is felt to be safe for discharge.  I am discharging him with a prescription for naproxen and I am referring him to orthopedics for follow-up.  Final Clinical Impression(s) / ED Diagnoses Final diagnoses:  Left knee pain, unspecified chronicity  Sinus tachycardia    Rx / DC Orders ED Discharge Orders          Ordered    naproxen (NAPROSYN) 500 MG tablet  2 times daily        06/12/22  1610              [name], MD 06/12/22 [id number]

## 2022-08-28 ENCOUNTER — Other Ambulatory Visit: Payer: Self-pay

## 2022-08-28 ENCOUNTER — Encounter (HOSPITAL_COMMUNITY): Payer: Self-pay

## 2022-08-28 ENCOUNTER — Emergency Department (HOSPITAL_COMMUNITY): Payer: Medicaid Other

## 2022-08-28 ENCOUNTER — Emergency Department (HOSPITAL_COMMUNITY)
Admission: EM | Admit: 2022-08-28 | Discharge: 2022-08-28 | Disposition: A | Discharge status: Home / Self Care | Payer: Medicaid Other | Source: Home / Self Care | Attending: Emergency Medicine | Admitting: Emergency Medicine

## 2022-08-28 DIAGNOSIS — Z79899 Other long term (current) drug therapy: Secondary | ICD-10-CM | POA: Diagnosis not present

## 2022-08-28 DIAGNOSIS — R55 Syncope and collapse: Secondary | ICD-10-CM | POA: Diagnosis not present

## 2022-08-28 DIAGNOSIS — R0789 Other chest pain: Secondary | ICD-10-CM | POA: Insufficient documentation

## 2022-08-28 DIAGNOSIS — R079 Chest pain, unspecified: Secondary | ICD-10-CM | POA: Diagnosis not present

## 2022-08-28 DIAGNOSIS — R000 Tachycardia, unspecified: Secondary | ICD-10-CM | POA: Diagnosis not present

## 2022-08-28 DIAGNOSIS — I10 Essential (primary) hypertension: Secondary | ICD-10-CM | POA: Insufficient documentation

## 2022-08-28 DIAGNOSIS — R059 Cough, unspecified: Secondary | ICD-10-CM | POA: Diagnosis not present

## 2022-08-28 LAB — CBC WITH DIFFERENTIAL/PLATELET
Abs Immature Granulocytes: 0.01 10*3/uL (ref 0.00–0.07)
Basophils Absolute: 0.1 10*3/uL (ref 0.0–0.1)
Basophils Relative: 1 %
Eosinophils Absolute: 0.3 10*3/uL (ref 0.0–0.5)
Eosinophils Relative: 6 %
HCT: 42 % (ref 39.0–52.0)
Hemoglobin: 14.6 g/dL (ref 13.0–17.0)
Immature Granulocytes: 0 %
Lymphocytes Relative: 34 %
Lymphs Abs: 1.8 10*3/uL (ref 0.7–4.0)
MCH: 32.2 pg (ref 26.0–34.0)
MCHC: 34.8 g/dL (ref 30.0–36.0)
MCV: 92.5 fL (ref 80.0–100.0)
Monocytes Absolute: 0.5 10*3/uL (ref 0.1–1.0)
Monocytes Relative: 9 %
Neutro Abs: 2.6 10*3/uL (ref 1.7–7.7)
Neutrophils Relative %: 50 %
Platelets: 241 10*3/uL (ref 150–400)
RBC: 4.54 MIL/uL (ref 4.22–5.81)
RDW: 12.9 % (ref 11.5–15.5)
WBC: 5.2 10*3/uL (ref 4.0–10.5)
nRBC: 0 % (ref 0.0–0.2)

## 2022-08-28 LAB — URINALYSIS, ROUTINE W REFLEX MICROSCOPIC
Bacteria, UA: NONE SEEN
Bilirubin Urine: NEGATIVE
Glucose, UA: NEGATIVE mg/dL
Ketones, ur: NEGATIVE mg/dL
Leukocytes,Ua: NEGATIVE
Nitrite: NEGATIVE
Protein, ur: NEGATIVE mg/dL
Specific Gravity, Urine: 1.006 (ref 1.005–1.030)
pH: 7 (ref 5.0–8.0)

## 2022-08-28 LAB — COMPREHENSIVE METABOLIC PANEL
ALT: 84 U/L — ABNORMAL HIGH (ref 0–44)
AST: 290 U/L — ABNORMAL HIGH (ref 15–41)
Albumin: 3.6 g/dL (ref 3.5–5.0)
Alkaline Phosphatase: 63 U/L (ref 38–126)
Anion gap: 9 (ref 5–15)
BUN: 6 mg/dL (ref 6–20)
CO2: 23 mmol/L (ref 22–32)
Calcium: 7.7 mg/dL — ABNORMAL LOW (ref 8.9–10.3)
Chloride: 104 mmol/L (ref 98–111)
Creatinine, Ser: 0.92 mg/dL (ref 0.61–1.24)
GFR, Estimated: >60 mL/min (ref >60)
Glucose, Bld: 89 mg/dL (ref 70–99)
Potassium: 3.7 mmol/L (ref 3.5–5.1)
Sodium: 136 mmol/L (ref 135–145)
Total Bilirubin: 1.2 mg/dL (ref 0.3–1.2)
Total Protein: 6.7 g/dL (ref 6.5–8.1)

## 2022-08-28 LAB — CBG MONITORING, ED
Glucose-Capillary: 81 mg/dL (ref 70–99)
Glucose-Capillary: 94 mg/dL (ref 70–99)

## 2022-08-28 LAB — TROPONIN I (HIGH SENSITIVITY)
Troponin I (High Sensitivity): 21 ng/L — ABNORMAL HIGH (ref <18)
Troponin I (High Sensitivity): 21 ng/L — ABNORMAL HIGH (ref <18)

## 2022-08-28 LAB — I-STAT CHEM 8, ED
BUN: 3 mg/dL — ABNORMAL LOW (ref 6–20)
Calcium, Ion: 1 mmol/L — ABNORMAL LOW (ref 1.15–1.40)
Chloride: 104 mmol/L (ref 98–111)
Creatinine, Ser: 1.3 mg/dL — ABNORMAL HIGH (ref 0.61–1.24)
Glucose, Bld: 86 mg/dL (ref 70–99)
HCT: 43 % (ref 39.0–52.0)
Hemoglobin: 14.6 g/dL (ref 13.0–17.0)
Potassium: 3.8 mmol/L (ref 3.5–5.1)
Sodium: 140 mmol/L (ref 135–145)
TCO2: 23 mmol/L (ref 22–32)

## 2022-08-28 LAB — MAGNESIUM: Magnesium: 2 mg/dL (ref 1.7–2.4)

## 2022-08-28 LAB — RAPID URINE DRUG SCREEN, HOSP PERFORMED
Amphetamines: NOT DETECTED
Barbiturates: NOT DETECTED
Benzodiazepines: NOT DETECTED
Cocaine: POSITIVE — AB
Opiates: NOT DETECTED
Tetrahydrocannabinol: POSITIVE — AB

## 2022-08-28 LAB — ETHANOL: Alcohol, Ethyl (B): 103 mg/dL — ABNORMAL HIGH (ref <10)

## 2022-08-28 MED ORDER — THIAMINE HCL 100 MG/ML IJ SOLN
100.0000 mg | Freq: Once | INTRAMUSCULAR | Status: AC
  Administered 2022-08-28: 100 mg via INTRAVENOUS
  Filled 2022-08-28: qty 2

## 2022-08-28 MED ORDER — LACTATED RINGERS IV BOLUS
2000.0000 mL | Freq: Once | INTRAVENOUS | Status: AC
  Administered 2022-08-28: 2000 mL via INTRAVENOUS

## 2022-08-28 MED ORDER — NALOXONE HCL 4 MG/0.1ML NA LIQD: NASAL | 0 refills | Status: AC

## 2022-08-28 NOTE — ED Provider Notes (Signed)
[place] EMERGENCY DEPARTMENT AT [facility] Provider Note   CSN: [id number] Arrival date & time: 08/28/22  4098     History  Chief Complaint  Patient presents with   Loss of Consciousness    Paul Lambert is a 47 y.o. male.  HPI Patient presents for episode of unresponsiveness.  Medical history includes alcohol abuse, cocaine use, seizures, HTN, depression.  EMS was called to home by family when patient was unable to be aroused.  Family was concerned the patient was not breathing.  They were instructed to initiate CPR by 911 operator.  When EMS arrived on scene, patient was awake but drowsy.  He was given IV fluids prior to arrival and had improved mentation.  He states that he drank 3 cases of beer, and used cocaine and marijuana last night.  He states this is typical for him.  He currently endorses some chest pain which he attributes to bystander CPR.  He denies any other current symptoms.  History per significant other: Patient actually drinks approximately 6 beers per day.  He was "partying" over the weekend.  Last night she witnessed him have a panic attack.  She describes this as rapid breathing.  He then stiffened up and had a brief loss of consciousness.  This morning, he was having a normal conversation.  He went unresponsive and stopped breathing.  Significant other provided CPR for less than 1 minute.  Patient woke up but then had a second syncopal episode approximately 1 minute later.  Again, he did not appear to be breathing and significant other initiated CPR.  This happened repeatedly for 4 episodes.    Home Medications Prior to Admission medications   Medication Sig Start Date End Date Taking? Authorizing Provider  naloxone [facility]) nasal spray 4 mg/0.1 mL In the event of an opiate overdose, spray into nostril and call 911. 08/28/22  Yes Gloris Manchester, MD  amLODipine (NORVASC) 5 MG tablet Take 1 tablet (5 mg total) by mouth daily. 06/22/21 07/22/21  Massengill,  Harrold Donath, MD  chlordiazePOXIDE (LIBRIUM) 25 MG capsule 50mg  PO TID x 1D, then 25-50mg  PO BID X 1D, then 25-50mg  PO QD X 1D 05/04/22   Kommor, Madison, MD  gabapentin (NEURONTIN) 100 MG capsule Take 1 capsule (100 mg total) by mouth 2 (two) times daily. 06/21/21 07/21/21  Massengill, Harrold Donath, MD  gabapentin (NEURONTIN) 100 MG capsule Take 2 capsules (200 mg total) by mouth at bedtime. 06/21/21 07/21/21  Massengill, Harrold Donath, MD  hydrochlorothiazide (HYDRODIURIL) 12.5 MG tablet Take 1 tablet (12.5 mg total) by mouth daily. 06/22/21 07/22/21  Massengill, Harrold Donath, MD  naproxen (NAPROSYN) 500 MG tablet Take 1 tablet (500 mg total) by mouth 2 (two) times daily. 06/12/22   [name], MD  pantoprazole (PROTONIX) 40 MG tablet Take 1 tablet (40 mg total) by mouth daily. 06/22/21 07/22/21  Massengill, Harrold Donath, MD  sertraline (ZOLOFT) 100 MG tablet Take 1 tablet (100 mg total) by mouth daily. 06/22/21 07/22/21  Massengill, Harrold Donath, MD  traZODone (DESYREL) 100 MG tablet Take 1 tablet (100 mg total) by mouth at bedtime as needed for sleep. 06/21/21 07/21/21  Massengill, Harrold Donath, MD      Allergies    Ace inhibitors and Onion    Review of Systems   Review of Systems  Cardiovascular:  Positive for chest pain.  Neurological:  Positive for syncope.  All other systems reviewed and are negative.   Physical Exam Updated Vital Signs BP 118/73   Pulse (!) 106  Temp 98.2 F (36.8 C) (Oral)   Resp 18   Ht 5\' 9"  (1.753 m)   Wt 83.9 kg   SpO2 92%   BMI 27.32 kg/m  Physical Exam Vitals and nursing note reviewed.  Constitutional:      General: He is not in acute distress.    Appearance: Normal appearance. He is well-developed. He is not ill-appearing, toxic-appearing or diaphoretic.  HENT:     Head: Normocephalic and atraumatic.     Right Ear: External ear normal.     Left Ear: External ear normal.     Nose: Nose normal.     Mouth/Throat:     Mouth: Mucous membranes are moist.  Eyes:     Extraocular Movements:  Extraocular movements intact.     Conjunctiva/sclera: Conjunctivae normal.  Cardiovascular:     Rate and Rhythm: Normal rate and regular rhythm.     Heart sounds: No murmur heard. Pulmonary:     Effort: Pulmonary effort is normal. No respiratory distress.     Breath sounds: Normal breath sounds. No wheezing or rales.  Chest:     Chest wall: Tenderness present.  Abdominal:     General: There is no distension.     Palpations: Abdomen is soft.     Tenderness: There is no abdominal tenderness.  Musculoskeletal:        General: No swelling. Normal range of motion.     Cervical back: Normal range of motion and neck supple.     Right lower leg: No edema.     Left lower leg: No edema.  Skin:    General: Skin is warm and dry.     Coloration: Skin is not jaundiced or pale.  Neurological:     General: No focal deficit present.     Mental Status: He is alert and oriented to person, place, and time.  Psychiatric:        Mood and Affect: Mood normal.        Behavior: Behavior normal.     ED Results / Procedures / Treatments   Labs (all labs ordered are listed, but only abnormal results are displayed) Labs Reviewed  COMPREHENSIVE METABOLIC PANEL - Abnormal; Notable for the following components:      Result Value   Calcium 7.7 (*)    AST 290 (*)    ALT 84 (*)    All other components within normal limits  URINALYSIS, ROUTINE W REFLEX MICROSCOPIC - Abnormal; Notable for the following components:   Hgb urine dipstick SMALL (*)    All other components within normal limits  ETHANOL - Abnormal; Notable for the following components:   Alcohol, Ethyl (B) 103 (*)    All other components within normal limits  RAPID URINE DRUG SCREEN, HOSP PERFORMED - Abnormal; Notable for the following components:   Cocaine POSITIVE (*)    Tetrahydrocannabinol POSITIVE (*)    All other components within normal limits  I-STAT CHEM 8, ED - Abnormal; Notable for the following components:   BUN 3 (*)     Creatinine, Ser 1.30 (*)    Calcium, Ion 1.00 (*)    All other components within normal limits  TROPONIN I (HIGH SENSITIVITY) - Abnormal; Notable for the following components:   Troponin I (High Sensitivity) 21 (*)    All other components within normal limits  TROPONIN I (HIGH SENSITIVITY) - Abnormal; Notable for the following components:   Troponin I (High Sensitivity) 21 (*)    All other components within  normal limits  CBC WITH DIFFERENTIAL/PLATELET  MAGNESIUM  CBG MONITORING, ED  CBG MONITORING, ED    EKG EKG Interpretation Date/Time:  Sunday August 28 2022 09:22:17 EDT Ventricular Rate:  111 PR Interval:  146 QRS Duration:  94 QT Interval:  381 QTC Calculation: 518 R Axis:   48  Text Interpretation: Sinus tachycardia Prolonged QT interval Confirmed by Gloris Manchester [id number]) on 08/28/2022 10:16:15 AM  Radiology CT HEAD WO CONTRAST  Result Date: 08/28/2022 CLINICAL DATA:  Syncope/presyncope. EXAM: CT HEAD WITHOUT CONTRAST TECHNIQUE: Contiguous axial images were obtained from the base of the skull through the vertex without intravenous contrast. RADIATION DOSE REDUCTION: This exam was performed according to the departmental dose-optimization program which includes automated exposure control, adjustment of the mA and/or kV according to patient size and/or use of iterative reconstruction technique. COMPARISON:  07/31/2020 FINDINGS: Brain: No evidence of acute infarction, hemorrhage, hydrocephalus, extra-axial collection or mass lesion/mass effect. Vascular: No hyperdense vessel or unexpected calcification. Skull: Normal. Negative for fracture or focal lesion. Sinuses/Orbits: There is mucosal thickening involving the maxillary sinuses and ethmoid air cells. No sinus fluid levels. Other: None IMPRESSION: 1. No acute intracranial abnormalities. 2. Chronic maxillary and ethmoid sinus disease. Electronically Signed   By: Signa Kell M.D.   On: 08/28/2022 10:33   DG Chest Port 1 View  Result  Date: 08/28/2022 CLINICAL DATA:  Chest pain and cough EXAM: PORTABLE CHEST 1 VIEW COMPARISON:  01/07/2022 FINDINGS: Artifact from EKG leads. Normal heart size and mediastinal contours. No acute infiltrate or edema. No effusion or pneumothorax. No acute osseous findings. IMPRESSION: No active disease. Electronically Signed   By: Tiburcio Pea M.D.   On: 08/28/2022 09:51    Procedures Procedures    Medications Ordered in ED Medications  lactated ringers bolus 2,000 mL (0 mLs Intravenous Stopped 08/28/22 1110)  thiamine (VITAMIN B1) injection 100 mg (100 mg Intravenous Given 08/28/22 1004)    ED Course/ Medical Decision Making/ A&P                             Medical Decision Making Amount and/or Complexity of Data Reviewed Labs: ordered. Radiology: ordered.  Risk Prescription drug management.   This patient presents to the ED for concern of loss of consciousness, this involves an extensive number of treatment options, and is a complaint that carries with it a high risk of complications and morbidity.  The differential diagnosis includes syncope, seizure, arrhythmia, opiate narcosis, other intoxication   Co morbidities that complicate the patient evaluation  alcohol abuse, cocaine use, seizures, HTN, depression   Additional history obtained:  Additional history obtained from EMS, patient's significant other External records from outside source obtained and reviewed including EMR   Lab Tests:  I Ordered, and personally interpreted labs.  The pertinent results include: Creatinine slightly increased from baseline, normal electrolytes, normal hemoglobin, no leukocytosis, ethanol of 103 consistent with heavy alcohol use last night, UDS positive for cocaine and THC.   Imaging Studies ordered:  I ordered imaging studies including chest x-ray, CT head I independently visualized and interpreted imaging which showed no acute findings I agree with the radiologist  interpretation   Cardiac Monitoring: / EKG:  The patient was maintained on a cardiac monitor.  I personally viewed and interpreted the cardiac monitored which showed an underlying rhythm of: Sinus rhythm  Problem List / ED Course / Critical interventions / Medication management  Patient presents for unresponsiveness at home.  This is likely secondary to intoxication.  When EMS arrived on scene, patient was awake but drowsy.  On arrival in the ED, he is alert and oriented.  He endorses some chest discomfort which is likely attributable to family initiating CPR at home and they thought he was not breathing.  Patient does state that he has history of sleep apnea.  His current breathing is unlabored.  Chest tenderness is present.  Lungs are clear to auscultation.  Vital signs are notable for mild tachycardia.  Additional IV fluids were ordered.  Lab work was initiated.  Patient was kept on cardiac monitor.  Patient maintained normal sinus rhythm.  Lab work was notable for elevated transaminases and pattern consistent with alcohol abuse.  Patient's significant other arrived at bedside and provided further history.  Per this new history, patient had 4 repeated episodes of loss of consciousness and apnea.  This raises concern for higher syncope and/or opiate narcosis.  On cardiac monitor, patient maintained in normal sinus rhythm.  Lab work is not consistent with seizures or prolonged syncopal episodes.  He denies ingesting any pills or powders today.  He states that he did last night.  Patient was offered admission for observation.  He declines.  Patient is ambulatory without difficulty.  Patient was discharged in stable condition. I ordered medication including IV fluids for hydration, thiamine for chronic alcohol use Reevaluation of the patient after these medicines showed that the patient improved I have reviewed the patients home medicines and have made adjustments as needed   Social Determinants of  Health:  Polysubstance abuse        Final Clinical Impression(s) / ED Diagnoses Final diagnoses:  Syncope, unspecified syncope type    Rx / DC Orders ED Discharge Orders          Ordered    naloxone (NARCAN) nasal spray 4 mg/0.1 mL        08/28/22 1323              Gloris Manchester, MD 08/28/22 1324

## 2022-08-28 NOTE — ED Notes (Signed)
Back from CT

## 2022-08-28 NOTE — Discharge Instructions (Signed)
Fentanyl can make its way into various other drugs.  You may ingest fentanyl without knowing it.  This can cause loss of consciousness and impaired breathing.  A prescription for Narcan is attached.  If you have a known or suspected opiate overdose, spray Narcan into nostril and call 911.  Return to the emergency department for any new or worsening symptoms of concern.

## 2022-08-28 NOTE — ED Triage Notes (Signed)
Pt BIB [facility] from home C/O unresponsiveness. Reportedly pt "partied too hard," used ETOH, cocaine, and THC last night. EMS reports pt alert to voice, drowsy on arrival. Gave 500 mL IVF bolus.

## 2022-11-04 ENCOUNTER — Encounter: Payer: Self-pay | Admitting: Family Medicine

## 2022-11-04 ENCOUNTER — Ambulatory Visit (INDEPENDENT_AMBULATORY_CARE_PROVIDER_SITE_OTHER): Payer: Medicaid Other | Admitting: Family Medicine

## 2022-11-04 VITALS — BP 161/117 | HR 94 | Ht 69.0 in | Wt 193.1 lb

## 2022-11-04 DIAGNOSIS — Z1211 Encounter for screening for malignant neoplasm of colon: Secondary | ICD-10-CM | POA: Diagnosis not present

## 2022-11-04 DIAGNOSIS — G8929 Other chronic pain: Secondary | ICD-10-CM

## 2022-11-04 DIAGNOSIS — G479 Sleep disorder, unspecified: Secondary | ICD-10-CM

## 2022-11-04 DIAGNOSIS — E559 Vitamin D deficiency, unspecified: Secondary | ICD-10-CM | POA: Diagnosis not present

## 2022-11-04 DIAGNOSIS — M25562 Pain in left knee: Secondary | ICD-10-CM | POA: Diagnosis not present

## 2022-11-04 DIAGNOSIS — E7849 Other hyperlipidemia: Secondary | ICD-10-CM

## 2022-11-04 DIAGNOSIS — K219 Gastro-esophageal reflux disease without esophagitis: Secondary | ICD-10-CM

## 2022-11-04 DIAGNOSIS — Z1159 Encounter for screening for other viral diseases: Secondary | ICD-10-CM

## 2022-11-04 DIAGNOSIS — I10 Essential (primary) hypertension: Secondary | ICD-10-CM

## 2022-11-04 DIAGNOSIS — R7301 Impaired fasting glucose: Secondary | ICD-10-CM | POA: Diagnosis not present

## 2022-11-04 DIAGNOSIS — E038 Other specified hypothyroidism: Secondary | ICD-10-CM | POA: Diagnosis not present

## 2022-11-04 DIAGNOSIS — M5416 Radiculopathy, lumbar region: Secondary | ICD-10-CM | POA: Diagnosis not present

## 2022-11-04 DIAGNOSIS — Z114 Encounter for screening for human immunodeficiency virus [HIV]: Secondary | ICD-10-CM

## 2022-11-04 MED ORDER — OLMESARTAN MEDOXOMIL-HCTZ 20-12.5 MG PO TABS: 1.0000 | ORAL_TABLET | Freq: Every day | ORAL | 1 refills | Status: DC

## 2022-11-04 MED ORDER — METHOCARBAMOL 500 MG PO TABS
500.0000 mg | ORAL_TABLET | Freq: Four times a day (QID) | ORAL | 1 refills | Status: DC
Start: 2022-11-04 — End: 2022-11-04

## 2022-11-04 MED ORDER — METHOCARBAMOL 500 MG PO TABS
500.0000 mg | ORAL_TABLET | Freq: Four times a day (QID) | ORAL | 1 refills | Status: DC
Start: 2022-11-04 — End: 2022-12-22

## 2022-11-04 MED ORDER — PANTOPRAZOLE SODIUM 40 MG PO TBEC: 40.0000 mg | DELAYED_RELEASE_TABLET | Freq: Every day | ORAL | 1 refills | Status: DC

## 2022-11-04 MED ORDER — TRAZODONE HCL 100 MG PO TABS: 100.0000 mg | ORAL_TABLET | Freq: Every evening | ORAL | 0 refills | Status: DC | PRN

## 2022-11-04 MED ORDER — GABAPENTIN 100 MG PO CAPS: 100.0000 mg | ORAL_CAPSULE | Freq: Two times a day (BID) | ORAL | 1 refills | Status: DC

## 2022-11-04 MED ORDER — NAPROXEN 500 MG PO TABS: 500.0000 mg | ORAL_TABLET | Freq: Two times a day (BID) | ORAL | 0 refills | Status: DC

## 2022-11-04 MED ORDER — NAPROXEN 500 MG PO TABS
500.0000 mg | ORAL_TABLET | Freq: Two times a day (BID) | ORAL | 0 refills | Status: DC
Start: 2022-11-04 — End: 2022-11-04

## 2022-11-04 MED ORDER — TRAZODONE HCL 100 MG PO TABS: 100.0000 mg | ORAL_TABLET | Freq: Every evening | ORAL | 0 refills | Status: AC | PRN

## 2022-11-04 MED ORDER — GABAPENTIN 100 MG PO CAPS: 100.0000 mg | ORAL_CAPSULE | Freq: Two times a day (BID) | ORAL | 1 refills | Status: AC

## 2022-11-04 MED ORDER — OLMESARTAN MEDOXOMIL-HCTZ 20-12.5 MG PO TABS
1.0000 | ORAL_TABLET | Freq: Every day | ORAL | 1 refills | Status: DC
Start: 2022-11-04 — End: 2022-11-04

## 2022-11-04 NOTE — Progress Notes (Unsigned)
Established Patient Office Visit  Subjective:  Patient ID: Paul Lambert, male    DOB: [birth year]  Age: 47 y.o. MRN: [id number]  CC:  Chief Complaint  Patient presents with  . Establish Care    New patient, would like to discuss back and shoulder pain from work injury 6 months ago, also has bp concerns, has trouble sleeping.     HPI Paul Lambert is a 47 y.o. male with past medical history of *** presents for f/u of *** chronic medical conditions.  Past Medical History:  Diagnosis Date  . Cocaine abuse (HCC)   . ETOH abuse   . Hypertension   . Seizures (HCC)   . Substance abuse [facility])     Past Surgical History:  Procedure Laterality Date  . FACIAL COSMETIC SURGERY      Family History  Problem Relation Age of Onset  . Hypertension Mother   . Hypertension Father     Social History   Socioeconomic History  . Marital status: Single    Spouse name: Not on file  . Number of children: Not on file  . Years of education: Not on file  . Highest education level: Not on file  Occupational History  . Not on file  Tobacco Use  . Smoking status: Some Days    Current packs/day: 0.50    Types: Cigarettes  . Smokeless tobacco: Never  Vaping Use  . Vaping status: Never Used  Substance and Sexual Activity  . Alcohol use: Yes    Alcohol/week: 40.0 - 52.0 standard drinks of alcohol    Types: 24 - 36 Cans of beer, 16 Shots of liquor per week  . Drug use: Not Currently    Frequency: 7.0 times per week    Types: Marijuana, Cocaine    Comment: quit 1 year ago  . Sexual activity: Not on file  Other Topics Concern  . Not on file  Social History Narrative   Pt is a 47 y/o [place] American male with history of polysubstance abuse (cocaine, etoh & marijuana). Currently homeless and without a job.   Social Determinants of Health   Financial Resource Strain: Not on file  Food Insecurity: Not on file  Transportation Needs: Not on file  Physical Activity: Not on file  Stress: Not  on file  Social Connections: Not on file  Intimate Partner Violence: Not on file    Outpatient Medications Prior to Visit  Medication Sig Dispense Refill  . gabapentin (NEURONTIN) 100 MG capsule Take 1 capsule (100 mg total) by mouth 2 (two) times daily. 60 capsule 0  . gabapentin (NEURONTIN) 100 MG capsule Take 2 capsules (200 mg total) by mouth at bedtime. 60 capsule 0  . hydrochlorothiazide (HYDRODIURIL) 12.5 MG tablet Take 1 tablet (12.5 mg total) by mouth daily. 30 tablet 0  . naloxone (NARCAN) nasal spray 4 mg/0.1 mL In the event of an opiate overdose, spray into nostril and call 911. (Patient not taking: Reported on 11/04/2022) 1 each 0  . naproxen (NAPROSYN) 500 MG tablet Take 1 tablet (500 mg total) by mouth 2 (two) times daily. (Patient not taking: Reported on 11/04/2022) 60 tablet 0  . pantoprazole (PROTONIX) 40 MG tablet Take 1 tablet (40 mg total) by mouth daily. 30 tablet 0  . sertraline (ZOLOFT) 100 MG tablet Take 1 tablet (100 mg total) by mouth daily. 30 tablet 0  . traZODone (DESYREL) 100 MG tablet Take 1 tablet (100 mg total) by mouth at bedtime as  needed for sleep. 30 tablet 0  . amLODipine (NORVASC) 5 MG tablet Take 1 tablet (5 mg total) by mouth daily. 30 tablet 0  . chlordiazePOXIDE (LIBRIUM) 25 MG capsule 50mg  PO TID x 1D, then 25-50mg  PO BID X 1D, then 25-50mg  PO QD X 1D 10 capsule 0   No facility-administered medications prior to visit.    Allergies  Allergen Reactions  . Ace Inhibitors Swelling  . Onion Anaphylaxis    OTHER: lettuce.    ROS Review of Systems    Objective:    Physical Exam  BP (!) 161/117   Pulse 94   Ht 5\' 9"  (1.753 m)   Wt 193 lb 1.9 oz (87.6 kg)   SpO2 97%   BMI 28.52 kg/m  Wt Readings from Last 3 Encounters:  11/04/22 193 lb 1.9 oz (87.6 kg)  08/28/22 185 lb (83.9 kg)  06/12/22 180 lb (81.6 kg)    Lab Results  Component Value Date   TSH 1.584 06/16/2021   Lab Results  Component Value Date   WBC 5.2 08/28/2022   HGB  14.6 08/28/2022   HCT 43.0 08/28/2022   MCV 92.5 08/28/2022   PLT 241 08/28/2022   Lab Results  Component Value Date   NA 140 08/28/2022   K 3.8 08/28/2022   CO2 23 08/28/2022   GLUCOSE 86 08/28/2022   BUN 3 (L) 08/28/2022   CREATININE 1.30 (H) 08/28/2022   BILITOT 1.2 08/28/2022   ALKPHOS 63 08/28/2022   AST 290 (H) 08/28/2022   ALT 84 (H) 08/28/2022   PROT 6.7 08/28/2022   ALBUMIN 3.6 08/28/2022   CALCIUM 7.7 (L) 08/28/2022   ANIONGAP 9 08/28/2022   Lab Results  Component Value Date   CHOL 194 06/16/2021   Lab Results  Component Value Date   HDL 72 06/16/2021   Lab Results  Component Value Date   LDLCALC 53 06/16/2021   Lab Results  Component Value Date   TRIG 344 (H) 06/16/2021   Lab Results  Component Value Date   CHOLHDL 2.7 06/16/2021   Lab Results  Component Value Date   HGBA1C 5.6 06/16/2021      Assessment & Plan:  Colon cancer screening    Follow-up: No follow-ups on file.   Paul Laroche, FNP

## 2022-11-04 NOTE — Patient Instructions (Addendum)
I appreciate the opportunity to provide care to you today!    Follow up:  1 month for BP  Fasting Labs: please stop by the lab during the week to get your blood drawn (CBC, CMP, TSH, Lipid profile, HgA1c, Vit D)   Hypertension Management  Your current blood pressure is above the target goal of <140/90 mmHg. To address this, please start taking olmesartan-hydrochlorothiazide 20-12.5 milligrams daily  Medication Instructions: Take your blood pressure medication at the same time each day. After taking your medication, check your blood pressure at least an hour later. If your first reading is >140/90 mmHg, wait at least 10 minutes and recheck your blood pressure. Side Effects: In the initial days of therapy, you may experience dizziness or lightheadedness as your body adjusts to the lower blood pressure; this is expected. Diet and Lifestyle: Adhere to a low-sodium diet, limiting intake to less than 1500 mg daily, and increase your physical activity.  Hydration and Nutrition: Stay well-hydrated by drinking at least 64 ounces of water daily. Increase your servings of fruits and vegetables and avoid excessive sodium in your diet. Long-Term Considerations: Uncontrolled hypertension can increase the risk of cardiovascular diseases, including stroke, coronary artery disease, and heart failure.  Please report to the emergency department if your blood pressure exceeds 180/120 and is accompanied by symptoms such as headaches, chest pain, palpitations, blurred vision, or dizziness.   For managing GERD -Start taking Protonix 40 mg daily I recommend the following lifestyle changes:  Avoid Certain Foods and Drinks: Limit or eliminate coffee, chocolate, onions, peppermint, spicy foods, carbonated beverages, citrus fruits, tomatoes, garlic, alcohol, and fatty foods such as bacon, burgers, sausages, steak, fried foods, and dairy products.  Recommended Foods: Increase your intake of high-fiber foods including  whole grain cereals, oatmeal, brown rice, root vegetables, and non-citrus fruits. Opt for high-protein foods and healthy fats such as avocados, olive oil, nuts, and seeds.   Chronic Low Back Pain Management -Refill Robaxin 500 mg to take 4 times daily. -Refill naproxen 500 mg to take twice daily. -Refill gabapentin 100 mg to take twice daily. Recommendations: I recommend rest and avoidance of aggravating activities, such as prolonged standing or sitting. Apply heat to the lower back for 15 to 20 minutes, 3-4 times daily. Engage in stretching and strengthening exercises for the lower back. Please perform the exercises attached to your After Visit Summary (AVS). If you have any questions or concerns, feel free to reach out.  Sleep disturbance Start taking trazodone 100 mg at bedtime as needed Establish a Bedtime Routine: Create a consistent routine before bed to signal to your body that it is time to wind down. Maintain a Regular Sleep-Wake Schedule: Go to bed and wake up at the same time every day, even on weekends. Avoid Electronics Before Bed: Limit the use of computers and other electronic devices at least one hour before bedtime to reduce blue light exposure. Limit Time in Bed: If unable to fall asleep within 15 minutes, get out of bed and engage in a relaxing activity before trying again. Manage Daily Stress: Incorporate stress-reducing activities into your daily routine to alleviate anxiety and tension. Avoid Late Exercise: Refrain from vigorous exercise close to bedtime, as it can interfere with sleep. Use Bed for Sleep and Intimacy Only: Reserve the bed for sleep and sexual activity to strengthen the association between your bed and rest. Remove Electronics from [employer] Area: Consider keeping electronic devices away from the bedroom to minimize distractions.   Screening:  HIV and Hep C   Referrals today-orthopedic surgery, pain management,  Attached with your AVS, you will find  valuable resources for self-education. I highly recommend dedicating some time to thoroughly examine them.   Please continue to a heart-healthy diet and increase your physical activities. Try to exercise for at least five days a week.    It was a pleasure to see you and I look forward to continuing to work together on your health and well-being. Please do not hesitate to call the office if you need care or have questions about your care.  In case of emergency, please visit the Emergency Department for urgent care, or contact our clinic at [phone] to schedule an appointment. We're here to help you!   Have a wonderful day and week. With Gratitude, Gilmore Laroche MSN, FNP-BC

## 2022-11-05 DIAGNOSIS — G8929 Other chronic pain: Secondary | ICD-10-CM | POA: Insufficient documentation

## 2022-11-05 DIAGNOSIS — G479 Sleep disorder, unspecified: Secondary | ICD-10-CM | POA: Insufficient documentation

## 2022-11-05 DIAGNOSIS — K219 Gastro-esophageal reflux disease without esophagitis: Secondary | ICD-10-CM | POA: Insufficient documentation

## 2022-11-05 DIAGNOSIS — M5416 Radiculopathy, lumbar region: Secondary | ICD-10-CM | POA: Insufficient documentation

## 2022-11-05 NOTE — Assessment & Plan Note (Signed)
Refilled trazodone 100 mg to take at bedtime prn Reviewed sleep hygiene practices: Establish a Bedtime Routine: Create a consistent routine before bed to signal to your body that it is time to wind down. Maintain a Regular Sleep-Wake Schedule: Go to bed and wake up at the same time every day, even on weekends. Avoid Electronics Before Bed: Limit the use of computers and other electronic devices at least one hour before bedtime to reduce blue light exposure. Limit Time in Bed: If unable to fall asleep within 15 minutes, get out of bed and engage in a relaxing activity before trying again. Manage Daily Stress: Incorporate stress-reducing activities into your daily routine to alleviate anxiety and tension. Avoid Late Exercise: Refrain from vigorous exercise close to bedtime, as it can interfere with sleep. Use Bed for Sleep and Intimacy Only: Reserve the bed for sleep and sexual activity to strengthen the association between your bed and rest. Remove Electronics from [employer] Area: Consider keeping electronic devices away from the bedroom to minimize distractions.

## 2022-11-05 NOTE — Assessment & Plan Note (Signed)
Uncontrolled Hypertension Management  Your current blood pressure is above the target goal of <140/90 mmHg. To address this, please start taking olmesartan-hydrochlorothiazide 20-12.5 milligrams daily  Medication Instructions: Take your blood pressure medication at the same time each day. After taking your medication, check your blood pressure at least an hour later. If your first reading is >140/90 mmHg, wait at least 10 minutes and recheck your blood pressure. Side Effects: In the initial days of therapy, you may experience dizziness or lightheadedness as your body adjusts to the lower blood pressure; this is expected. Diet and Lifestyle: Adhere to a low-sodium diet, limiting intake to less than 1500 mg daily, and increase your physical activity.  Hydration and Nutrition: Stay well-hydrated by drinking at least 64 ounces of water daily. Increase your servings of fruits and vegetables and avoid excessive sodium in your diet. Long-Term Considerations: Uncontrolled hypertension can increase the risk of cardiovascular diseases, including stroke, coronary artery disease, and heart failure.  Please report to the emergency department if your blood pressure exceeds 180/120 and is accompanied by symptoms such as headaches, chest pain, palpitations, blurred vision, or dizziness.

## 2022-11-05 NOTE — Assessment & Plan Note (Signed)
-  Refill Robaxin 500 mg to take 4 times daily. -Refill naproxen 500 mg to take twice daily. -Refill gabapentin 100 mg to take twice daily. Recommendations: I recommend rest and avoidance of aggravating activities, such as prolonged standing or sitting. Apply heat to the lower back for 15 to 20 minutes, 3-4 times daily. Engage in stretching and strengthening exercises for the lower back. Please perform the exercises attached to your After Visit Summary (AVS). If you have any questions or concerns, feel free to reach out.

## 2022-11-05 NOTE — Assessment & Plan Note (Addendum)
-  continue use of knee brace for stabilization -Refill naproxen 500 mg to take twice daily. Recommendations: I recommend rest and avoidance of aggravating activities, such as prolonged standing or sitting. Apply heat to the lower back for 15 to 20 minutes, 3-4 times daily. Engage in stretching and strengthening exercises for the lower back.

## 2022-11-05 NOTE — Assessment & Plan Note (Signed)
For managing GERD, I recommend the following lifestyle changes:  Avoid Certain Foods and Drinks: Limit or eliminate coffee, chocolate, onions, peppermint, spicy foods, carbonated beverages, citrus fruits, tomatoes, garlic, alcohol, and fatty foods such as bacon, burgers, sausages, steak, fried foods, and dairy products.  Recommended Foods: Increase your intake of high-fiber foods including whole grain cereals, oatmeal, brown rice, root vegetables, and non-citrus fruits. Opt for high-protein foods and healthy fats such as avocados, olive oil, nuts, and seeds.

## 2022-11-09 DIAGNOSIS — E559 Vitamin D deficiency, unspecified: Secondary | ICD-10-CM | POA: Diagnosis not present

## 2022-11-09 DIAGNOSIS — Z1159 Encounter for screening for other viral diseases: Secondary | ICD-10-CM | POA: Diagnosis not present

## 2022-11-09 DIAGNOSIS — Z114 Encounter for screening for human immunodeficiency virus [HIV]: Secondary | ICD-10-CM | POA: Diagnosis not present

## 2022-11-09 DIAGNOSIS — E038 Other specified hypothyroidism: Secondary | ICD-10-CM | POA: Diagnosis not present

## 2022-11-09 DIAGNOSIS — R7301 Impaired fasting glucose: Secondary | ICD-10-CM | POA: Diagnosis not present

## 2022-11-09 DIAGNOSIS — E7849 Other hyperlipidemia: Secondary | ICD-10-CM | POA: Diagnosis not present

## 2022-11-10 LAB — CBC WITH DIFFERENTIAL/PLATELET
Basophils Absolute: 0.1 10*3/uL (ref 0.0–0.2)
Basos: 1 %
EOS (ABSOLUTE): 0.2 10*3/uL (ref 0.0–0.4)
Eos: 6 %
Hematocrit: 47.9 % (ref 37.5–51.0)
Hemoglobin: 16.3 g/dL (ref 13.0–17.7)
Immature Grans (Abs): 0 10*3/uL (ref 0.0–0.1)
Immature Granulocytes: 0 %
Lymphocytes Absolute: 2 10*3/uL (ref 0.7–3.1)
Lymphs: 51 %
MCH: 30.1 pg (ref 26.6–33.0)
MCHC: 34 g/dL (ref 31.5–35.7)
MCV: 89 fL (ref 79–97)
Monocytes Absolute: 0.3 10*3/uL (ref 0.1–0.9)
Monocytes: 9 %
Neutrophils Absolute: 1.3 10*3/uL — ABNORMAL LOW (ref 1.4–7.0)
Neutrophils: 33 %
Platelets: 239 10*3/uL (ref 150–450)
RBC: 5.41 x10E6/uL (ref 4.14–5.80)
RDW: 12 % (ref 11.6–15.4)
WBC: 3.8 10*3/uL (ref 3.4–10.8)

## 2022-11-10 LAB — CMP14+EGFR
ALT: 24 [IU]/L (ref 0–44)
AST: 17 [IU]/L (ref 0–40)
Albumin: 4.7 g/dL (ref 4.1–5.1)
Alkaline Phosphatase: 50 [IU]/L (ref 44–121)
BUN/Creatinine Ratio: 10 (ref 9–20)
BUN: 12 mg/dL (ref 6–24)
Bilirubin Total: 1 mg/dL (ref 0.0–1.2)
CO2: 24 mmol/L (ref 20–29)
Calcium: 9.7 mg/dL (ref 8.7–10.2)
Chloride: 101 mmol/L (ref 96–106)
Creatinine, Ser: 1.15 mg/dL (ref 0.76–1.27)
Globulin, Total: 2.7 g/dL (ref 1.5–4.5)
Glucose: 102 mg/dL — ABNORMAL HIGH (ref 70–99)
Potassium: 4 mmol/L (ref 3.5–5.2)
Sodium: 139 mmol/L (ref 134–144)
Total Protein: 7.4 g/dL (ref 6.0–8.5)
eGFR: 79 mL/min/{1.73_m2} (ref >59)

## 2022-11-10 LAB — LIPID PANEL
Chol/HDL Ratio: 3 {ratio} (ref 0.0–5.0)
Cholesterol, Total: 157 mg/dL (ref 100–199)
HDL: 53 mg/dL (ref >39)
LDL Chol Calc (NIH): 92 mg/dL (ref 0–99)
Triglycerides: 61 mg/dL (ref 0–149)
VLDL Cholesterol Cal: 12 mg/dL (ref 5–40)

## 2022-11-10 LAB — HIV ANTIBODY (ROUTINE TESTING W REFLEX): HIV Screen 4th Generation wRfx: NONREACTIVE

## 2022-11-10 LAB — HEMOGLOBIN A1C
Est. average glucose Bld gHb Est-mCnc: 128 mg/dL
Hgb A1c MFr Bld: 6.1 % — ABNORMAL HIGH (ref 4.8–5.6)

## 2022-11-10 LAB — HEPATITIS C ANTIBODY: Hep C Virus Ab: NONREACTIVE

## 2022-11-10 LAB — VITAMIN D 25 HYDROXY (VIT D DEFICIENCY, FRACTURES): Vit D, 25-Hydroxy: 16.2 ng/mL — ABNORMAL LOW (ref 30.0–100.0)

## 2022-11-10 LAB — TSH+FREE T4
Free T4: 1.45 ng/dL (ref 0.82–1.77)
TSH: 1.22 u[IU]/mL (ref 0.450–4.500)

## 2022-11-16 ENCOUNTER — Other Ambulatory Visit: Payer: Self-pay | Admitting: Family Medicine

## 2022-11-16 DIAGNOSIS — E559 Vitamin D deficiency, unspecified: Secondary | ICD-10-CM

## 2022-11-16 MED ORDER — VITAMIN D (ERGOCALCIFEROL) 1.25 MG (50000 UNIT) PO CAPS
50000.0000 [IU] | ORAL_CAPSULE | ORAL | 1 refills | Status: DC
Start: 2022-11-16 — End: 2022-12-02

## 2022-11-16 NOTE — Progress Notes (Signed)
Please inform the patient that his lab results indicate he is prediabetic. I recommend decreasing his intake of high-sugar foods and beverages while increasing physical activity. Additionally, his vitamin D levels are low, and a prescription for a weekly vitamin D supplement has been sent to his pharmacy. All other lab results are stable.

## 2022-11-29 DIAGNOSIS — Z1159 Encounter for screening for other viral diseases: Secondary | ICD-10-CM | POA: Diagnosis not present

## 2022-11-29 DIAGNOSIS — Z131 Encounter for screening for diabetes mellitus: Secondary | ICD-10-CM | POA: Diagnosis not present

## 2022-11-29 DIAGNOSIS — R5383 Other fatigue: Secondary | ICD-10-CM | POA: Diagnosis not present

## 2022-11-29 DIAGNOSIS — Z125 Encounter for screening for malignant neoplasm of prostate: Secondary | ICD-10-CM | POA: Diagnosis not present

## 2022-11-29 DIAGNOSIS — E559 Vitamin D deficiency, unspecified: Secondary | ICD-10-CM | POA: Diagnosis not present

## 2022-11-29 DIAGNOSIS — M129 Arthropathy, unspecified: Secondary | ICD-10-CM | POA: Diagnosis not present

## 2022-11-29 DIAGNOSIS — M5416 Radiculopathy, lumbar region: Secondary | ICD-10-CM | POA: Diagnosis not present

## 2022-11-29 DIAGNOSIS — E7800 Pure hypercholesterolemia, unspecified: Secondary | ICD-10-CM | POA: Diagnosis not present

## 2022-11-29 DIAGNOSIS — M25562 Pain in left knee: Secondary | ICD-10-CM | POA: Diagnosis not present

## 2022-11-29 DIAGNOSIS — Z6828 Body mass index (BMI) 28.0-28.9, adult: Secondary | ICD-10-CM | POA: Diagnosis not present

## 2022-12-02 ENCOUNTER — Telehealth: Payer: Self-pay | Admitting: Family Medicine

## 2022-12-02 ENCOUNTER — Other Ambulatory Visit: Payer: Self-pay

## 2022-12-02 DIAGNOSIS — E559 Vitamin D deficiency, unspecified: Secondary | ICD-10-CM

## 2022-12-02 MED ORDER — VITAMIN D (ERGOCALCIFEROL) 1.25 MG (50000 UNIT) PO CAPS: 50000.0000 [IU] | ORAL_CAPSULE | ORAL | 1 refills | Status: AC

## 2022-12-02 NOTE — Telephone Encounter (Signed)
Refill sent.

## 2022-12-02 NOTE — Telephone Encounter (Signed)
Prescription Request  12/02/2022  LOV: 11/04/2022  What is the name of the medication or equipment? Vitamin D, Ergocalciferol, (DRISDOL) 1.25 MG (50000 UNIT) CAPS capsule   Have you contacted your pharmacy to request a refill? Yes   Which pharmacy would you like this sent to?  [facility]  Patient notified that their request is being sent to the clinical staff for review and that they should receive a response within 2 business days.   Please advise at Mobile There is no such number on file (mobile)..[email or web address]

## 2022-12-05 ENCOUNTER — Ambulatory Visit (INDEPENDENT_AMBULATORY_CARE_PROVIDER_SITE_OTHER): Payer: Medicaid Other | Admitting: Family Medicine

## 2022-12-05 ENCOUNTER — Encounter: Payer: Self-pay | Admitting: Family Medicine

## 2022-12-05 VITALS — BP 124/88 | HR 100 | Ht 69.0 in | Wt 194.0 lb

## 2022-12-05 DIAGNOSIS — I10 Essential (primary) hypertension: Secondary | ICD-10-CM

## 2022-12-05 NOTE — Progress Notes (Signed)
Established Patient Office Visit  Subjective:  Patient ID: Paul Lambert, male    DOB: [birth year]  Age: 47 y.o. MRN: [id number]  CC:  Chief Complaint  Patient presents with   Hypertension    1 month f/u   Follow-up    Needs a letter typed up to allow him to keep his food stamps.    HPI Paul Lambert is a 47 y.o. male  presents for blood pressure f/u. For the details of today's visit, please refer to the assessment and plan.    Past Medical History:  Diagnosis Date   Cocaine abuse (HCC)    ETOH abuse    Hypertension    Seizures (HCC)    Substance abuse (HCC)     Past Surgical History:  Procedure Laterality Date   FACIAL COSMETIC SURGERY      Family History  Problem Relation Age of Onset   Hypertension Mother    Hypertension Father     Social History   Socioeconomic History   Marital status: Single    Spouse name: Not on file   Number of children: Not on file   Years of education: Not on file   Highest education level: Not on file  Occupational History   Not on file  Tobacco Use   Smoking status: Some Days    Current packs/day: 0.50    Types: Cigarettes   Smokeless tobacco: Never  Vaping Use   Vaping status: Never Used  Substance and Sexual Activity   Alcohol use: Yes    Alcohol/week: 40.0 - 52.0 standard drinks of alcohol    Types: 24 - 36 Cans of beer, 16 Shots of liquor per week   Drug use: Not Currently    Frequency: 7.0 times per week    Types: Marijuana, Cocaine    Comment: quit 1 year ago   Sexual activity: Not on file  Other Topics Concern   Not on file  Social History Narrative   Pt is a 47 y/o [place] male with history of polysubstance abuse (cocaine, etoh & marijuana). Currently homeless and without a job.   Social Determinants of Health   Financial Resource Strain: Not on file  Food Insecurity: Not on file  Transportation Needs: Not on file  Physical Activity: Not on file  Stress: Not on file  Social Connections: Not on  file  Intimate Partner Violence: Not on file    Outpatient Medications Prior to Visit  Medication Sig Dispense Refill   gabapentin (NEURONTIN) 100 MG capsule Take 1 capsule (100 mg total) by mouth 2 (two) times daily. 90 capsule 1   methocarbamol (ROBAXIN) 500 MG tablet Take 1 tablet (500 mg total) by mouth 4 (four) times daily. 120 tablet 1   naloxone (NARCAN) nasal spray 4 mg/0.1 mL In the event of an opiate overdose, spray into nostril and call 911. 1 each 0   naproxen (NAPROSYN) 500 MG tablet Take 1 tablet (500 mg total) by mouth 2 (two) times daily. 60 tablet 0   olmesartan-hydrochlorothiazide (BENICAR HCT) 20-12.5 MG tablet Take 1 tablet by mouth daily. 30 tablet 1   pantoprazole (PROTONIX) 40 MG tablet Take 1 tablet (40 mg total) by mouth daily. 30 tablet 1   Vitamin D, Ergocalciferol, (DRISDOL) 1.25 MG (50000 UNIT) CAPS capsule Take 1 capsule (50,000 Units total) by mouth every 7 (seven) days. 20 capsule 1   hydrochlorothiazide (HYDRODIURIL) 12.5 MG tablet Take 1 tablet (12.5 mg total) by mouth daily. 30 tablet  0   traZODone (DESYREL) 100 MG tablet Take 1 tablet (100 mg total) by mouth at bedtime as needed for sleep. 30 tablet 0   No facility-administered medications prior to visit.    Allergies  Allergen Reactions   Ace Inhibitors Swelling   Onion Anaphylaxis    OTHER: lettuce.    ROS Review of Systems  Constitutional:  Negative for fatigue and fever.  Eyes:  Negative for visual disturbance.  Respiratory:  Negative for chest tightness and shortness of breath.   Cardiovascular:  Negative for chest pain and palpitations.  Neurological:  Negative for dizziness and headaches.      Objective:    Physical Exam HENT:     Head: Normocephalic.     Right Ear: External ear normal.     Left Ear: External ear normal.     Nose: No congestion or rhinorrhea.     Mouth/Throat:     Mouth: Mucous membranes are moist.  Cardiovascular:     Rate and Rhythm: Regular rhythm.      Heart sounds: No murmur heard. Pulmonary:     Effort: No respiratory distress.     Breath sounds: Normal breath sounds.  Neurological:     Mental Status: He is alert.     BP 124/88   Pulse 100   Ht 5\' 9"  (1.753 m)   Wt 194 lb (88 kg)   SpO2 97%   BMI 28.65 kg/m  Wt Readings from Last 3 Encounters:  12/05/22 194 lb (88 kg)  11/04/22 193 lb 1.9 oz (87.6 kg)  08/28/22 185 lb (83.9 kg)    Lab Results  Component Value Date   TSH 1.220 11/09/2022   Lab Results  Component Value Date   WBC 3.8 11/09/2022   HGB 16.3 11/09/2022   HCT 47.9 11/09/2022   MCV 89 11/09/2022   PLT 239 11/09/2022   Lab Results  Component Value Date   NA 139 11/09/2022   K 4.0 11/09/2022   CO2 24 11/09/2022   GLUCOSE 102 (H) 11/09/2022   BUN 12 11/09/2022   CREATININE 1.15 11/09/2022   BILITOT 1.0 11/09/2022   ALKPHOS 50 11/09/2022   AST 17 11/09/2022   ALT 24 11/09/2022   PROT 7.4 11/09/2022   ALBUMIN 4.7 11/09/2022   CALCIUM 9.7 11/09/2022   ANIONGAP 9 08/28/2022   EGFR 79 11/09/2022   Lab Results  Component Value Date   CHOL 157 11/09/2022   Lab Results  Component Value Date   HDL 53 11/09/2022   Lab Results  Component Value Date   LDLCALC 92 11/09/2022   Lab Results  Component Value Date   TRIG 61 11/09/2022   Lab Results  Component Value Date   CHOLHDL 3.0 11/09/2022   Lab Results  Component Value Date   HGBA1C 6.1 (H) 11/09/2022      Assessment & Plan:  Primary hypertension Assessment & Plan: Controlled Encouraged to continue taking olmesartan-hydrochlorothiazide 20-12.5 mg daily Low-sodium diet with increased physical activity encouraged BP Readings from Last 3 Encounters:  12/05/22 124/88  11/04/22 (!) 161/117  08/28/22 (!) 146/113     Orders: -     BMP8+EGFR  Note: This chart has been completed using Engineer, civil (consulting) software, and while attempts have been made to ensure accuracy, certain words and phrases may not be transcribed as intended.     Follow-up: Return in about 4 months (around 04/07/2023).   Gilmore Laroche, FNP

## 2022-12-05 NOTE — Patient Instructions (Signed)
I appreciate the opportunity to provide care to you today!    Follow up:  4 months  Labs: please stop by the lab today to get your blood drawn (CBC, CMP, TSH, Lipid profile, HgA1c, Vit D)  -continue taking olmesartan-hydrochlorothiazide 20-12.5mg  daily  Attached with your AVS, you will find valuable resources for self-education. I highly recommend dedicating some time to thoroughly examine them.   Please continue to a heart-healthy diet and increase your physical activities. Try to exercise for at least five days a week.    It was a pleasure to see you and I look forward to continuing to work together on your health and well-being. Please do not hesitate to call the office if you need care or have questions about your care.  In case of emergency, please visit the Emergency Department for urgent care, or contact our clinic at [phone] to schedule an appointment. We're here to help you!   Have a wonderful day and week. With Gratitude, Gilmore Laroche MSN, FNP-BC

## 2022-12-05 NOTE — Assessment & Plan Note (Signed)
Controlled Encouraged to continue taking olmesartan-hydrochlorothiazide 20-12.5 mg daily Low-sodium diet with increased physical activity encouraged BP Readings from Last 3 Encounters:  12/05/22 124/88  11/04/22 (!) 161/117  08/28/22 (!) 146/113

## 2022-12-06 ENCOUNTER — Telehealth: Payer: Self-pay | Admitting: Family Medicine

## 2022-12-06 LAB — BMP8+EGFR
BUN/Creatinine Ratio: 12 (ref 9–20)
BUN: 15 mg/dL (ref 6–24)
CO2: 23 mmol/L (ref 20–29)
Calcium: 9.8 mg/dL (ref 8.7–10.2)
Chloride: 102 mmol/L (ref 96–106)
Creatinine, Ser: 1.27 mg/dL (ref 0.76–1.27)
Glucose: 87 mg/dL (ref 70–99)
Potassium: 4.5 mmol/L (ref 3.5–5.2)
Sodium: 141 mmol/L (ref 134–144)
eGFR: 70 mL/min/{1.73_m2} (ref >59)

## 2022-12-06 NOTE — Telephone Encounter (Signed)
Patient called back to let provider know social worker faxed over a form to fill out.

## 2022-12-06 NOTE — Telephone Encounter (Signed)
Patient called ask Malachi Bonds to send a letter to social service fax # [phone]  Letter for out of work for 4 months for chronic pain lower back.  Just seen yesterday.  Call patient back [phone]

## 2022-12-07 NOTE — Progress Notes (Signed)
Please inform the patient that his labs are stable

## 2022-12-13 NOTE — Telephone Encounter (Signed)
Patient asking for a call back has the form been received from his social worker through fax.

## 2022-12-18 NOTE — Telephone Encounter (Signed)
Have you received the forms faxed

## 2022-12-19 NOTE — Telephone Encounter (Signed)
Forms have not been received.

## 2022-12-19 NOTE — Telephone Encounter (Signed)
Would you kindly inform the patient that the form has not been received?

## 2022-12-20 NOTE — Telephone Encounter (Signed)
Patient return call, he will have his social worker refax the form to our office

## 2022-12-20 NOTE — Telephone Encounter (Signed)
Received message to contact patient about the form. Called patient left voicemail to contact our office.

## 2022-12-21 ENCOUNTER — Telehealth: Payer: Self-pay | Admitting: Family Medicine

## 2022-12-21 NOTE — Telephone Encounter (Signed)
Copied from CRM [id number]. Topic: General - Inquiry >> Dec 21, 2022  8:45 AM Theodis Sato wrote: Reason for CRM: PT would like confirmation at [phone] that his doctor Gilmore Laroche received a fax from his caseworker.

## 2022-12-21 NOTE — Telephone Encounter (Signed)
[facility] medical report   Copied Noted Sleeved (put in provider box)  Call patient when ready

## 2022-12-21 NOTE — Telephone Encounter (Signed)
Attempted to call pt to let him know forms have been received and have been given to pcp, phone number not in service p. Designer, television/film set.

## 2022-12-22 ENCOUNTER — Other Ambulatory Visit: Payer: Self-pay | Admitting: Family Medicine

## 2022-12-22 DIAGNOSIS — M109 Gout, unspecified: Secondary | ICD-10-CM | POA: Diagnosis not present

## 2022-12-22 DIAGNOSIS — Z6829 Body mass index (BMI) 29.0-29.9, adult: Secondary | ICD-10-CM | POA: Diagnosis not present

## 2022-12-22 DIAGNOSIS — S83409S Sprain of unspecified collateral ligament of unspecified knee, sequela: Secondary | ICD-10-CM | POA: Diagnosis not present

## 2022-12-22 DIAGNOSIS — Z79899 Other long term (current) drug therapy: Secondary | ICD-10-CM | POA: Diagnosis not present

## 2022-12-22 DIAGNOSIS — M5416 Radiculopathy, lumbar region: Secondary | ICD-10-CM

## 2022-12-22 DIAGNOSIS — E785 Hyperlipidemia, unspecified: Secondary | ICD-10-CM | POA: Diagnosis not present

## 2022-12-22 DIAGNOSIS — M47897 Other spondylosis, lumbosacral region: Secondary | ICD-10-CM | POA: Diagnosis not present

## 2022-12-22 DIAGNOSIS — R7303 Prediabetes: Secondary | ICD-10-CM | POA: Diagnosis not present

## 2022-12-22 DIAGNOSIS — E559 Vitamin D deficiency, unspecified: Secondary | ICD-10-CM | POA: Diagnosis not present

## 2022-12-23 NOTE — Telephone Encounter (Signed)
completed

## 2022-12-28 DIAGNOSIS — Z79899 Other long term (current) drug therapy: Secondary | ICD-10-CM | POA: Diagnosis not present

## 2023-01-02 ENCOUNTER — Other Ambulatory Visit: Payer: Self-pay | Admitting: Family Medicine

## 2023-01-02 DIAGNOSIS — I10 Essential (primary) hypertension: Secondary | ICD-10-CM

## 2023-01-02 DIAGNOSIS — K219 Gastro-esophageal reflux disease without esophagitis: Secondary | ICD-10-CM

## 2023-01-04 ENCOUNTER — Telehealth: Payer: Self-pay | Admitting: Family Medicine

## 2023-01-04 ENCOUNTER — Other Ambulatory Visit: Payer: Self-pay | Admitting: Family Medicine

## 2023-01-04 DIAGNOSIS — M5416 Radiculopathy, lumbar region: Secondary | ICD-10-CM

## 2023-01-04 MED ORDER — OXYCODONE-ACETAMINOPHEN 5-325 MG PO TABS: 1.0000 | ORAL_TABLET | ORAL | 0 refills | Status: DC | PRN

## 2023-01-04 NOTE — Telephone Encounter (Signed)
A short supply of Percocet has been sent to his pharmacy to help manage his pain.  Please encouraged the patient follow-up on the referral placed to pain management

## 2023-01-04 NOTE — Telephone Encounter (Signed)
Pt is needing a call back regarding his back pain he stated that the pills are not worming for him he would like a call back

## 2023-01-04 NOTE — Telephone Encounter (Signed)
Pt reports medications not helping with back pain , reports low back and neck pain , pain level is a 8 out 10, causing trouble sleeping.

## 2023-01-04 NOTE — Telephone Encounter (Signed)
Tried calling pt back , vm box is full.

## 2023-01-04 NOTE — Telephone Encounter (Signed)
Pt reports he will be going on 12/04, and will go for his knee on 12/03

## 2023-01-09 ENCOUNTER — Other Ambulatory Visit: Payer: Self-pay | Admitting: Family Medicine

## 2023-01-09 ENCOUNTER — Telehealth: Payer: Self-pay

## 2023-01-09 DIAGNOSIS — M5416 Radiculopathy, lumbar region: Secondary | ICD-10-CM

## 2023-01-09 MED ORDER — OXYCODONE-ACETAMINOPHEN 5-325 MG PO TABS: 1.0000 | ORAL_TABLET | ORAL | 0 refills | Status: AC | PRN

## 2023-01-09 NOTE — Telephone Encounter (Signed)
A short supply has been sent. Please encourage the patient to follow up and attend their appointment with pain management on 01/11/23.

## 2023-01-09 NOTE — Telephone Encounter (Signed)
Copied from CRM [id number]. Topic: Clinical - Medication Refill >> Jan 09, 2023 10:14 AM Cassiday T wrote: Most Recent Primary Care Visit:  Provider: Gilmore Laroche  Department: [facility]  Visit Type: OFFICE VISIT  Date: 12/05/2022  Medication: oxyCODONE-acetaminophen (PERCOCET/ROXICET) 5-325 MG tablet    BP PILL   Has the patient contacted their pharmacy? No (Agent: If no, request that the patient contact the pharmacy for the refill. If patient does not wish to contact the pharmacy document the reason why and proceed with request.) (Agent: If yes, when and what did the pharmacy advise?)  Is this the correct pharmacy for this prescription? Yes If no, delete pharmacy and type the correct one.  This is the patient's preferred pharmacy:  [facility] [address], [place] - [address] [place] [address] [place] [address] Phone: [phone] Fax: [phone]    Has the prescription been filled recently? Yes  Is the patient out of the medication? Yes  Has the patient been seen for an appointment in the last year OR does the patient have an upcoming appointment? Yes  Can we respond through MyChart? No  Agent: Please be advised that Rx refills may take up to 3 business days. We ask that you follow-up with your pharmacy.

## 2023-01-10 DIAGNOSIS — M25562 Pain in left knee: Secondary | ICD-10-CM | POA: Diagnosis not present

## 2023-01-10 NOTE — Telephone Encounter (Signed)
Rx sent yesterday

## 2023-01-10 NOTE — Telephone Encounter (Unsigned)
Copied from CRM [id number]. Topic: Clinical - Medication Refill >> Jan 09, 2023  2:54 PM Orinda Kenner C wrote: Most Recent Primary Care Visit:  Provider: Gilmore Laroche  Department: [facility]  Visit Type: OFFICE VISIT  Date: 12/05/2022  Medication: oxyCODONE-acetaminophen (PERCOCET/ROXICET) 5-325 MG tablet and blood pressure med, pt does not know the name of rx  Has the patient contacted their pharmacy? Yes (Agent: If no, request that the patient contact the pharmacy for the refill. If patient does not wish to contact the pharmacy document the reason why and proceed with request.) (Agent: If yes, when and what did the pharmacy advise?) Contact the office  Is this the correct pharmacy for this prescription? Yes If no, delete pharmacy and type the correct one.  This is the patient's preferred pharmacy:  [facility] [address], [place] - [address] [place] [address] [place] [address] [place] [address] Phone: [phone] Fax: [phone]   Has the prescription been filled recently?   Is the patient out of the medication?   Has the patient been seen for an appointment in the last year OR does the patient have an upcoming appointment?   Can we respond through MyChart? No, pls c/b at [phone]  Agent: Please be advised that Rx refills may take up to 3 business days. We ask that you follow-up with your pharmacy.

## 2023-01-17 ENCOUNTER — Other Ambulatory Visit: Payer: Self-pay | Admitting: Family Medicine

## 2023-01-17 ENCOUNTER — Telehealth: Payer: Self-pay

## 2023-01-17 ENCOUNTER — Ambulatory Visit: Payer: Self-pay | Admitting: Family Medicine

## 2023-01-17 DIAGNOSIS — I10 Essential (primary) hypertension: Secondary | ICD-10-CM

## 2023-01-17 MED ORDER — OLMESARTAN MEDOXOMIL-HCTZ 20-12.5 MG PO TABS: 1.0000 | ORAL_TABLET | Freq: Every day | ORAL | 1 refills | Status: AC

## 2023-01-17 NOTE — Telephone Encounter (Signed)
Please encouraged the patient to follow-up with pain management for medication refill

## 2023-01-17 NOTE — Telephone Encounter (Signed)
Copied from CRM [id number]. Topic: Clinical - Prescription Issue >> Jan 17, 2023  1:07 PM Colletta Maryland S wrote: Reason for CRM: pts olmesartan-hydrochlorothiazide (BENICAR HCT) 20-12.5 MG tablet was sent to pharmacy but not Oxycodone rx, pt would like a callback in regards to rx and when it will be sent to pharmacy

## 2023-01-17 NOTE — Telephone Encounter (Signed)
Copied from CRM [id number]. Topic: Clinical - Medication Refill >> Jan 17, 2023 10:09 AM Gaetano Hawthorne wrote: Most Recent Primary Care Visit:  Provider: Gilmore Laroche  Department: [facility]  Visit Type: OFFICE VISIT  Date: 12/05/2022  Medication: ***  Has the patient contacted their pharmacy?  (Agent: If no, request that the patient contact the pharmacy for the refill. If patient does not wish to contact the pharmacy document the reason why and proceed with request.) (Agent: If yes, when and what did the pharmacy advise?)  Is this the correct pharmacy for this prescription?  If no, delete pharmacy and type the correct one.  This is the patient's preferred pharmacy:  [facility] [address], [place] - [address] [place] [address] [place] [address] [place] [address] Phone: [phone] Fax: [phone]  [facility] [place], [place] - [address] [place] [address] Phone: [phone] Fax: [phone]   Has the prescription been filled recently?   Is the patient out of the medication?   Has the patient been seen for an appointment in the last year OR does the patient have an upcoming appointment?   Can we respond through MyChart?   Agent: Please be advised that Rx refills may take up to 3 business days. We ask that you follow-up with your pharmacy.

## 2023-01-17 NOTE — Telephone Encounter (Signed)
  Chief Complaint: medication refill  Disposition: [] ED /[] Urgent Care (no appt availability in office) / [] Appointment(In office/virtual)/ []  [facility]/ [] Home Care/ [] Refused Recommended Disposition /[] [facility]/ [x]  Follow-up with PCP Additional Notes: Patient requesting refill of oxycodone-acetaminophen. Pt states he was prescribed 10 pills and he has already ran out. Pt states he saw pain management on 01-11-23 and was told "they don't want multiple doctors prescribing things so they told me to go back to her since she's been the one prescribing it". Pt states he starts physical therapy on 01-26-23. Pt denies any new symptoms, c/o pain to lower back and left knee.  Copied from CRM [id number]. Topic: Clinical - Medication Refill >> Jan 17, 2023 10:11 AM Gaetano Hawthorne wrote: Most Recent Primary Care Visit:  Provider: Gilmore Laroche  Department: [facility]  Visit Type: OFFICE VISIT  Date: 12/05/2022  Medication:  oxyCODONE-acetaminophen (PERCOCET/ROXICET) 5-325 MG tablet  (This appears to be a discontinued medication - patient reported that the provider calls in 10 pills at a time).   Has the patient contacted their pharmacy? No (Agent: If no, request that the patient contact the pharmacy for the refill. If patient does not wish to contact the pharmacy document the reason why and proceed with request.) (Agent: If yes, when and what did the pharmacy advise?)  Is this the correct pharmacy for this prescription? Yes If no, delete pharmacy and type the correct one.  This is the patient's preferred pharmacy:  [facility] [address] [place] - [address] [place] [address] [place] [address] [place] [address] Phone: [phone] Fax: [phone]   Has the prescription been filled recently? No  Is the patient out of the medication? No  Has the patient been seen for an appointment in the last year OR does the patient have an upcoming appointment? Yes  Can  we respond through MyChart? No  Agent: Please be advised that Rx refills may take up to 3 business days. We ask that you follow-up with your pharmacy. Reason for Disposition  Caller requesting a CONTROLLED substance prescription refill (e.g., narcotics, ADHD medicines)  Answer Assessment - Initial Assessment Questions 1. DRUG NAME: "What medicine do you need to have refilled?"     Oxycodone-acetaminophen  2. REFILLS REMAINING: "How many refills are remaining?" (Note: The label on the medicine or pill bottle will show how many refills are remaining. If there are no refills remaining, then a renewal may be needed.)     Zero.  3. EXPIRATION DATE: "What is the expiration date?" (Note: The label states when the prescription will expire, and thus can no longer be refilled.)     Pt states he has run out.  4. PRESCRIBING HCP: "Who prescribed it?" Reason: If prescribed by specialist, call should be referred to that group.     Dr Denny Levy  5. SYMPTOMS: "Do you have any symptoms?"     Sharp, chronic pain in lower back and left knee. Pt states he was seen by pain management on 01-11-23 and states he is starting physical therapy on 01-26-23.  Protocols used: Medication Refill and Renewal Call-A-AH

## 2023-01-17 NOTE — Telephone Encounter (Signed)
Copied from CRM [id number]. Topic: Clinical - Medication Refill >> Jan 17, 2023 10:06 AM Gaetano Hawthorne wrote: Most Recent Primary Care Visit:  Provider: Gilmore Laroche  Department: [facility]  Visit Type: OFFICE VISIT  Date: 12/05/2022  Medication:   Has the patient contacted their pharmacy?  (Agent: If no, request that the patient contact the pharmacy for the refill. If patient does not wish to contact the pharmacy document the reason why and proceed with request.) (Agent: If yes, when and what did the pharmacy advise?)  Is this the correct pharmacy for this prescription?  If no, delete pharmacy and type the correct one.  This is the patient's preferred pharmacy:  [facility] [address] [place] - [address] [place] [address] [place] [address] [place] [address] Phone: [phone] Fax: [phone]  [facility] [place], [place] - [address] [place] [address] Phone: [phone] Fax: [phone]   Has the prescription been filled recently?   Is the patient out of the medication?   Has the patient been seen for an appointment in the last year OR does the patient have an upcoming appointment?   Can we respond through MyChart?   Agent: Please be advised that Rx refills may take up to 3 business days. We ask that you follow-up with your pharmacy.

## 2023-01-17 NOTE — Telephone Encounter (Signed)
Copied from CRM [id number]. Topic: Clinical - Medication Question >> Jan 17, 2023  2:04 PM Prudencio Pair wrote: Reason for CRM: Patient has called multiple times today in regards to med refills. He's trying to get blood pressure meds & oxycodone filled. Advised that blood pressure med looks like it has been sent to pharmacy already but oxycodone has not. He stated he's trying to get them both sent because he has to get a ride and will not be able to go back & forth. Please give pt a call once BOTH has been sent. CB #: [id number].

## 2023-01-17 NOTE — Telephone Encounter (Signed)
Pt states he went on 12/04, they did not give him pain meds only gout medication.

## 2023-01-18 ENCOUNTER — Telehealth: Payer: Self-pay | Admitting: Family Medicine

## 2023-01-18 ENCOUNTER — Other Ambulatory Visit: Payer: Self-pay | Admitting: Family Medicine

## 2023-01-18 DIAGNOSIS — M5416 Radiculopathy, lumbar region: Secondary | ICD-10-CM

## 2023-01-18 DIAGNOSIS — I10 Essential (primary) hypertension: Secondary | ICD-10-CM

## 2023-01-18 MED ORDER — OXYCODONE-ACETAMINOPHEN 5-325 MG PO TABS: 1.0000 | ORAL_TABLET | Freq: Three times a day (TID) | ORAL | 0 refills | Status: AC | PRN

## 2023-01-18 NOTE — Telephone Encounter (Signed)
Prescription Request  01/18/2023  LOV: 12/05/2022  What is the name of the medication or equipment? oxyCODONE-acetaminophen (PERCOCET/ROXICET) 5-325 MG tablet [[id number]]  ENDED    Have you contacted your pharmacy to request a refill? No   Which pharmacy would you like this sent to?  [facility] [address], [place] - [address] [place] [address] [place] [address] [place] [address] Phone: [phone] Fax: [phone]     Patient notified that their request is being sent to the clinical staff for review and that they should receive a response within 2 business days.   Please advise at Mobile [phone] (mobile)

## 2023-01-18 NOTE — Telephone Encounter (Signed)
Spoke to pt states he did f/u with Pain management they did not provide him with this medication they only gave him medicine for gout, and referred him to physical therapy, states he has been getting this medication from his PCP, states he ran out and needs a refil.

## 2023-01-18 NOTE — Telephone Encounter (Signed)
Please encourage the patient to fax the notes from pain management as there are no recent encounters in Epic. I will provide a courtesy refill this time, but future refills will depend on the documentation. I believe his pain would be better managed by pain management. If needed, another referral can be placed.

## 2023-01-19 NOTE — Telephone Encounter (Signed)
Vm box is full.

## 2023-01-24 DIAGNOSIS — M109 Gout, unspecified: Secondary | ICD-10-CM | POA: Diagnosis not present

## 2023-01-24 DIAGNOSIS — S83409S Sprain of unspecified collateral ligament of unspecified knee, sequela: Secondary | ICD-10-CM | POA: Diagnosis not present

## 2023-01-24 DIAGNOSIS — E785 Hyperlipidemia, unspecified: Secondary | ICD-10-CM | POA: Diagnosis not present

## 2023-01-24 DIAGNOSIS — Z6829 Body mass index (BMI) 29.0-29.9, adult: Secondary | ICD-10-CM | POA: Diagnosis not present

## 2023-01-24 DIAGNOSIS — Z79899 Other long term (current) drug therapy: Secondary | ICD-10-CM | POA: Diagnosis not present

## 2023-01-24 DIAGNOSIS — M47897 Other spondylosis, lumbosacral region: Secondary | ICD-10-CM | POA: Diagnosis not present

## 2023-01-24 DIAGNOSIS — E559 Vitamin D deficiency, unspecified: Secondary | ICD-10-CM | POA: Diagnosis not present

## 2023-01-24 DIAGNOSIS — R7303 Prediabetes: Secondary | ICD-10-CM | POA: Diagnosis not present

## 2023-01-26 ENCOUNTER — Telehealth: Payer: Self-pay | Admitting: Family Medicine

## 2023-01-26 ENCOUNTER — Ambulatory Visit: Payer: Self-pay | Admitting: Family Medicine

## 2023-01-26 NOTE — Telephone Encounter (Signed)
Pt just called back said to please make sure Rx is sent to [facility] and NOT [facility].  Thanks

## 2023-01-26 NOTE — Telephone Encounter (Addendum)
Prescription Request  01/26/2023  LOV: 12/05/2022  What is the name of the medication or equipment? oxyCODONE-acetaminophen (PERCOCET/ROXICET) 5-325 MG tablet [[id number]]  ENDED    Have you contacted your pharmacy to request a refill? Yes   Which pharmacy would you like this sent to?    [facility] [place], [place] - [address] [place], [place] [address] Phone: [phone]  Fax: [phone]          Patient notified that their request is being sent to the clinical staff for review and that they should receive a response within 2 business days.   Please advise at Mobile [phone] (mobile)    Patient wants a call back in regard

## 2023-01-26 NOTE — Telephone Encounter (Signed)
Patient requesting call back regarding Percocet refill.  He had an appointment with the pain clinic Tuesday and per the patient, the doctor stated that she did not want multiple providers filling the prescription and would prefer that the pcp fill it since it was filled by her last.

## 2023-01-26 NOTE — Telephone Encounter (Signed)
Routed to office Copied from CRM [id number]. Topic: Clinical - Medication Refill >> Jan 26, 2023 10:55 AM Gaetano Hawthorne wrote: Most Recent Primary Care Visit:  Provider: Gilmore Laroche  Department: [facility]  Visit Type: OFFICE VISIT  Date: 12/05/2022  Medication: oxyCODONE-acetaminophen (PERCOCET/ROXICET) 5-325 MG tablet  Has the patient contacted their pharmacy? Yes, advised to reach out to PCP (Agent: If no, request that the patient contact the pharmacy for the refill. If patient does not wish to contact the pharmacy document the reason why and proceed with request.) (Agent: If yes, when and what did the pharmacy advise?)  Is this the correct pharmacy for this prescription? Yes If no, delete pharmacy and type the correct one.  This is the patient's preferred pharmacy:  [facility] [address] [place] - [address] [place] [address] [place] [address] [place] [address] Phone: [phone] Fax: [phone]    Has the prescription been filled recently? No  Is the patient out of the medication? No  Has the patient been seen for an appointment in the last year OR does the patient have an upcoming appointment? Yes  Can we respond through MyChart? No  Agent: Please be advised that Rx refills may take up to 3 business days. We ask that you follow-up with your pharmacy.

## 2023-01-26 NOTE — Telephone Encounter (Signed)
Patient calling back again has already called 7 times checking status of meds. Can a nurse give patient a called back at [phone]

## 2023-01-27 NOTE — Telephone Encounter (Signed)
Have attempted to reach pt , per last refill that pcp sent him, he needs to provide documentation from pain management in order to continue getting refills.

## 2023-01-27 NOTE — Telephone Encounter (Unsigned)
Copied from CRM [id number]. Topic: Clinical - Prescription Issue >> Jan 26, 2023  1:51 PM Fuller Mandril wrote: Reason for CRM: Pt called back in to check status of rx refill. States she usually does it the same day and that he is currently waiting at the pharmacy. Reached out to CAL, advised to amend previous call. Unable to add to notes to that call.

## 2023-01-27 NOTE — Telephone Encounter (Signed)
Spoke to [place] states pain management will now be prescribing his pain medication, will no longer need a script from PCP

## 2023-01-27 NOTE — Telephone Encounter (Unsigned)
Copied from CRM [id number]. Topic: Clinical - Medication Question >> Jan 26, 2023 12:30 PM Herbert Seta B wrote: Reason for CRM: Patient calling back to get status of RX-Percocet called to [facility] pharmacy?

## 2023-01-27 NOTE — Telephone Encounter (Signed)
Te has been addressed.

## 2023-01-30 DIAGNOSIS — Z79899 Other long term (current) drug therapy: Secondary | ICD-10-CM | POA: Diagnosis not present

## 2023-02-15 DIAGNOSIS — Z79899 Other long term (current) drug therapy: Secondary | ICD-10-CM | POA: Diagnosis not present

## 2023-02-17 DIAGNOSIS — Z79899 Other long term (current) drug therapy: Secondary | ICD-10-CM | POA: Diagnosis not present

## 2023-03-17 DIAGNOSIS — Z79899 Other long term (current) drug therapy: Secondary | ICD-10-CM | POA: Diagnosis not present

## 2023-03-22 DIAGNOSIS — Z79899 Other long term (current) drug therapy: Secondary | ICD-10-CM | POA: Diagnosis not present

## 2023-04-07 ENCOUNTER — Ambulatory Visit: Payer: Medicaid Other | Admitting: Family Medicine

## 2023-04-11 ENCOUNTER — Other Ambulatory Visit: Payer: Self-pay | Admitting: Family Medicine

## 2023-04-11 DIAGNOSIS — M5416 Radiculopathy, lumbar region: Secondary | ICD-10-CM

## 2023-04-11 MED ORDER — NAPROXEN 500 MG PO TABS: 500.0000 mg | ORAL_TABLET | Freq: Two times a day (BID) | ORAL | 0 refills | Status: AC

## 2023-04-11 NOTE — Telephone Encounter (Signed)
 Last Fill: 11/04/22  Last OV: 12/05/22 Next OV: 05/18/23  Routing to provider for review/authorization.

## 2023-04-11 NOTE — Telephone Encounter (Signed)
 Copied from CRM [id number]. Topic: Clinical - Medication Refill >> Apr 11, 2023  2:45 PM Elle L wrote: Most Recent Primary Care Visit:  Provider: Gilmore Laroche  Department: [facility]  Visit Type: OFFICE VISIT  Date: 12/05/2022  Medication: naproxen (NAPROSYN) 500 MG tablet   Has the patient contacted their pharmacy? Yes  Is this the correct pharmacy for this prescription? Yes If no, delete pharmacy and type the correct one.  This is the patient's preferred pharmacy:  [facility] [address], [place] - [address] [place] [address] [place] [address] [place] [address] Phone: [phone] Fax: [phone]  Has the prescription been filled recently? No  Is the patient out of the medication? Yes  Has the patient been seen for an appointment in the last year OR does the patient have an upcoming appointment? Yes  Can we respond through MyChart? Yes  Agent: Please be advised that Rx refills may take up to 3 business days. We ask that you follow-up with your pharmacy.

## 2023-04-13 NOTE — Telephone Encounter (Signed)
 Copied from CRM [id number]. Topic: General - Other >> Apr 12, 2023  4:52 PM Kristie Cowman wrote: Reason for CRM: The patient is calling in requesting oxycodone or hydrocodone until he can get in to see his pain management doctor.    [phone] - Best number to reach the patient.

## 2023-04-14 ENCOUNTER — Telehealth: Payer: Self-pay | Admitting: Family Medicine

## 2023-04-14 DIAGNOSIS — Z79899 Other long term (current) drug therapy: Secondary | ICD-10-CM | POA: Diagnosis not present

## 2023-04-14 NOTE — Telephone Encounter (Signed)
 Patient came by office needs another letter out of work for 6 months. See the last letter written.  Patient had to be moved due to provider has been out of office. Call patient when ready for pick up.[phone]

## 2023-04-17 NOTE — Telephone Encounter (Signed)
 Sent to provider for review and approval.

## 2023-04-18 ENCOUNTER — Ambulatory Visit: Payer: Medicaid Other | Admitting: Family Medicine

## 2023-04-26 ENCOUNTER — Telehealth: Payer: Self-pay | Admitting: Family Medicine

## 2023-04-26 NOTE — Telephone Encounter (Signed)
 Copied from CRM [id number]. 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 According to the patient the letter should be from April til October.The patient needs the note to reapply for SNAP. Please contact the patient at [phone]

## 2023-04-27 NOTE — Telephone Encounter (Signed)
 When is he following up with pain management

## 2023-04-27 NOTE — Telephone Encounter (Signed)
 Copied from CRM [id number]. Topic: General - Other >> Apr 26, 2023  9:30 AM Antwanette L wrote: Reason for CRM: The patient is calling requesting oxycodone until he can see his pain management doctor

## 2023-05-01 ENCOUNTER — Other Ambulatory Visit: Payer: Self-pay | Admitting: Family Medicine

## 2023-05-01 NOTE — Telephone Encounter (Signed)
 Patient calling to follow up on request for letter. Patient states he will be out of work for 6 months due to his pain and needing note to reapply for food stamps.   Patient requesting a call back, [phone]

## 2023-05-01 NOTE — Telephone Encounter (Signed)
 Copied from CRM [id number]. Topic: Clinical - Medication Refill >> May 01, 2023 12:35 PM Shon Hale wrote: Most Recent Primary Care Visit:  Provider: Gilmore Laroche  Department: [facility]  Visit Type: OFFICE VISIT  Date: 12/05/2022  Medication: oxyCODONE-acetaminophen (PERCOCET/ROXICET) 5-325 MG tablet  Has the patient contacted their pharmacy? No States he does not see his pain management provider until end of next month. Requesting Malachi Bonds to send in rx.   Is this the correct pharmacy for this prescription? Yes This is the patient's preferred pharmacy:  [facility] [address], [place] - [address] [place] [address] [place] [address] [place] [address] Phone: [phone] Fax: [phone]  Has the prescription been filled recently? No  Is the patient out of the medication? Yes  Has the patient been seen for an appointment in the last year OR does the patient have an upcoming appointment? Yes  Can we respond through MyChart? No  Agent: Please be advised that Rx refills may take up to 3 business days. We ask that you follow-up with your pharmacy.

## 2023-05-03 ENCOUNTER — Telehealth: Payer: Self-pay

## 2023-05-03 NOTE — Telephone Encounter (Signed)
 Patient is asking for a call back whether or not this will be sent in to his pharmacy

## 2023-05-03 NOTE — Telephone Encounter (Signed)
 Pt called back in again for status of refill of hydrochodone, let him know its still in progress

## 2023-05-03 NOTE — Telephone Encounter (Signed)
 Patient calling office back need med refill on his pain management provider on vacation until 04.28.2025  HYDROcodone-acetaminophen (NORCO/VICODIN) 5-325 MG   Pharmacy: [facility]

## 2023-05-03 NOTE — Telephone Encounter (Signed)
 Copied from CRM [id number]. Topic: Clinical - Prescription Issue >> May 01, 2023  3:59 PM Carlatta H wrote: Reason for CRM: Client will not be going to pain management until the end of April//Please advised when prescription is called in //Pain management doctor is on vacation//

## 2023-05-05 NOTE — Telephone Encounter (Signed)
 Copied from CRM [id number]. Topic: General - Other >> May 04, 2023 10:29 AM Tiffany S wrote: Reason for CRM: Patient called checking status of the refill advise patient message was sent to the provider this morning and to allow time for response

## 2023-05-08 NOTE — Telephone Encounter (Signed)
 Patient called back to check status of request. Patient says pain doctor out of town until 28th. He has appt with Paul Lambert on 4/10. I told him unable to fill without appt per notes. I just added to waitlist. If someone can see him sooner. Thank you.

## 2023-05-11 ENCOUNTER — Other Ambulatory Visit: Payer: Self-pay | Admitting: Family Medicine

## 2023-05-11 DIAGNOSIS — G8929 Other chronic pain: Secondary | ICD-10-CM

## 2023-05-12 DIAGNOSIS — Z79899 Other long term (current) drug therapy: Secondary | ICD-10-CM | POA: Diagnosis not present

## 2023-05-12 NOTE — Telephone Encounter (Signed)
 Pt advised with verbal understanding

## 2023-05-18 ENCOUNTER — Ambulatory Visit (INDEPENDENT_AMBULATORY_CARE_PROVIDER_SITE_OTHER): Payer: Medicaid Other | Admitting: Family Medicine

## 2023-05-18 ENCOUNTER — Encounter: Payer: Self-pay | Admitting: Family Medicine

## 2023-05-18 VITALS — BP 131/81 | HR 96 | Resp 16 | Ht 69.0 in | Wt 196.0 lb

## 2023-05-18 DIAGNOSIS — M5416 Radiculopathy, lumbar region: Secondary | ICD-10-CM | POA: Diagnosis not present

## 2023-05-18 DIAGNOSIS — Z79899 Other long term (current) drug therapy: Secondary | ICD-10-CM | POA: Diagnosis not present

## 2023-05-18 DIAGNOSIS — G8929 Other chronic pain: Secondary | ICD-10-CM

## 2023-05-18 NOTE — Patient Instructions (Addendum)
 I appreciate the opportunity to provide care to you today!     We will refill your prescription once we verify with the pharmacy that it was not picked up.      Please continue to a heart-healthy diet and increase your physical activities. Try to exercise for at least five days a week.    It was a pleasure to see you and I look forward to continuing to work together on your health and well-being. Please do not hesitate to call the office if you need care or have questions about your care.  In case of emergency, please visit the Emergency Department for urgent care, or contact our clinic at [phone] to schedule an appointment. We're here to help you!   Have a wonderful day and week. With Gratitude, Gilmore Laroche MSN, FNP-BC

## 2023-05-18 NOTE — Progress Notes (Signed)
 Acute Office Visit  Subjective:    Patient ID: Paul Lambert, male    DOB: 1976-01-26, 48 y.o.   MRN: [id number]  Chief Complaint  Patient presents with   Back Pain    Lower back pain. He was told he has to be seen to get any pain medication. 8/10 on pain scale, states when he is working or walking a lot and bending over it hurts worse. States his back Dr is out of town until 4/28    HPI Patient is in today for the above complaints.  For the details of today's visit, please refer to the assessment and plan.     Past Medical History:  Diagnosis Date   Cocaine abuse (HCC)    ETOH abuse    Hypertension    Seizures (HCC)    Substance abuse (HCC)     Past Surgical History:  Procedure Laterality Date   FACIAL COSMETIC SURGERY      Family History  Problem Relation Age of Onset   Hypertension Mother    Hypertension Father     Social History   Socioeconomic History   Marital status: Single    Spouse name: Not on file   Number of children: Not on file   Years of education: Not on file   Highest education level: Not on file  Occupational History   Not on file  Tobacco Use   Smoking status: Some Days    Current packs/day: 0.50    Types: Cigarettes   Smokeless tobacco: Never  Vaping Use   Vaping status: Never Used  Substance and Sexual Activity   Alcohol use: Yes    Alcohol/week: 40.0 - 52.0 standard drinks of alcohol    Types: 24 - 36 Cans of beer, 16 Shots of liquor per week   Drug use: Not Currently    Frequency: 7.0 times per week    Types: Marijuana, Cocaine    Comment: quit 1 year ago   Sexual activity: Not on file  Other Topics Concern   Not on file  Social History Narrative   Pt is a 48 y/o [place] American male with history of polysubstance abuse (cocaine, etoh & marijuana). Currently homeless and without a job.   Social Drivers of Corporate investment banker Strain: Not on file  Food Insecurity: Not on file  Transportation Needs: Not on file   Physical Activity: Not on file  Stress: Not on file  Social Connections: Not on file  Intimate Partner Violence: Not on file    Outpatient Medications Prior to Visit  Medication Sig Dispense Refill   gabapentin (NEURONTIN) 100 MG capsule Take 1 capsule (100 mg total) by mouth 2 (two) times daily. 90 capsule 1   methocarbamol (ROBAXIN) 500 MG tablet Take 1 tablet (500 mg total) by mouth 4 (four) times daily. 120 tablet 1   naloxone (NARCAN) nasal spray 4 mg/0.1 mL In the event of an opiate overdose, spray into nostril and call 911. 1 each 0   naproxen (NAPROSYN) 500 MG tablet Take 1 tablet (500 mg total) by mouth 2 (two) times daily. 60 tablet 0   olmesartan-hydrochlorothiazide (BENICAR HCT) 20-12.5 MG tablet Take 1 tablet by mouth daily. 30 tablet 1   oxyCODONE-acetaminophen (PERCOCET/ROXICET) 5-325 MG tablet Take 1 tablet by mouth 2 (two) times daily as needed.     Vitamin D, Ergocalciferol, (DRISDOL) 1.25 MG (50000 UNIT) CAPS capsule Take 1 capsule (50,000 Units total) by mouth every 7 (seven) days. 20  capsule 1   hydrochlorothiazide (HYDRODIURIL) 12.5 MG tablet Take 1 tablet (12.5 mg total) by mouth daily. 30 tablet 0   pantoprazole (PROTONIX) 40 MG tablet Take 1 tablet (40 mg total) by mouth daily. 30 tablet 1   traZODone (DESYREL) 100 MG tablet Take 1 tablet (100 mg total) by mouth at bedtime as needed for sleep. 30 tablet 0   No facility-administered medications prior to visit.    Allergies  Allergen Reactions   Ace Inhibitors Swelling   Onion Anaphylaxis    OTHER: lettuce.    Review of Systems  Constitutional:  Negative for fatigue and fever.  Eyes:  Negative for visual disturbance.  Respiratory:  Negative for chest tightness and shortness of breath.   Cardiovascular:  Negative for chest pain and palpitations.  Neurological:  Negative for dizziness and headaches.       Objective:    Physical Exam HENT:     Head: Normocephalic.     Right Ear: External ear normal.      Left Ear: External ear normal.     Nose: No congestion or rhinorrhea.     Mouth/Throat:     Mouth: Mucous membranes are moist.  Cardiovascular:     Rate and Rhythm: Regular rhythm.     Heart sounds: No murmur heard. Pulmonary:     Effort: No respiratory distress.     Breath sounds: Normal breath sounds.  Neurological:     Mental Status: He is alert.     BP 131/81   Pulse 96   Resp 16   Ht 5\' 9"  (1.753 m)   Wt 196 lb (88.9 kg)   SpO2 98%   BMI 28.94 kg/m  Wt Readings from Last 3 Encounters:  05/18/23 196 lb (88.9 kg)  12/05/22 194 lb (88 kg)  11/04/22 193 lb 1.9 oz (87.6 kg)       Assessment & Plan:  Lumbar radiculopathy, chronic Assessment & Plan: Reviewed PDMP, and Percocet 5-325 was refilled on 05/12/2023. Contacted the pharmacy and confirmed that the patient picked up the prescription. Encouraged the patient to continue the current regimen and follow up with pain management as prescribed.   Orders: -     ToxASSURE Select 13 (MW), Urine    Leldon Steege, FNP

## 2023-05-20 NOTE — Assessment & Plan Note (Addendum)
 Reviewed PDMP, and Percocet 5-325 was refilled on 05/12/2023. Contacted the pharmacy and confirmed that the patient picked up the prescription. Encouraged the patient to continue the current regimen and follow up with pain management as prescribed.

## 2023-07-19 ENCOUNTER — Encounter (HOSPITAL_COMMUNITY): Payer: Self-pay | Admitting: *Deleted

## 2023-07-19 ENCOUNTER — Emergency Department (HOSPITAL_COMMUNITY)
Admission: EM | Admit: 2023-07-19 | Discharge: 2023-07-19 | Disposition: A | Discharge status: Home / Self Care | Attending: Emergency Medicine | Admitting: Emergency Medicine

## 2023-07-19 ENCOUNTER — Emergency Department (HOSPITAL_COMMUNITY)

## 2023-07-19 ENCOUNTER — Other Ambulatory Visit: Payer: Self-pay

## 2023-07-19 DIAGNOSIS — R519 Headache, unspecified: Secondary | ICD-10-CM | POA: Insufficient documentation

## 2023-07-19 DIAGNOSIS — I159 Secondary hypertension, unspecified: Secondary | ICD-10-CM

## 2023-07-19 DIAGNOSIS — I10 Essential (primary) hypertension: Secondary | ICD-10-CM | POA: Diagnosis not present

## 2023-07-19 LAB — COMPREHENSIVE METABOLIC PANEL WITH GFR
ALT: 24 U/L (ref 0–44)
AST: 24 U/L (ref 15–41)
Albumin: 4 g/dL (ref 3.5–5.0)
Alkaline Phosphatase: 45 U/L (ref 38–126)
Anion gap: 12 (ref 5–15)
BUN: 8 mg/dL (ref 6–20)
CO2: 21 mmol/L — ABNORMAL LOW (ref 22–32)
Calcium: 8.6 mg/dL — ABNORMAL LOW (ref 8.9–10.3)
Chloride: 105 mmol/L (ref 98–111)
Creatinine, Ser: 1.05 mg/dL (ref 0.61–1.24)
GFR, Estimated: >60 mL/min (ref >60)
Glucose, Bld: 104 mg/dL — ABNORMAL HIGH (ref 70–99)
Potassium: 3.6 mmol/L (ref 3.5–5.1)
Sodium: 138 mmol/L (ref 135–145)
Total Bilirubin: 0.9 mg/dL (ref 0.0–1.2)
Total Protein: 7.2 g/dL (ref 6.5–8.1)

## 2023-07-19 LAB — CBC WITH DIFFERENTIAL/PLATELET
Abs Immature Granulocytes: 0.03 10*3/uL (ref 0.00–0.07)
Basophils Absolute: 0.1 10*3/uL (ref 0.0–0.1)
Basophils Relative: 1 %
Eosinophils Absolute: 0 10*3/uL (ref 0.0–0.5)
Eosinophils Relative: 1 %
HCT: 47.1 % (ref 39.0–52.0)
Hemoglobin: 15.6 g/dL (ref 13.0–17.0)
Immature Granulocytes: 1 %
Lymphocytes Relative: 35 %
Lymphs Abs: 2.2 10*3/uL (ref 0.7–4.0)
MCH: 29.9 pg (ref 26.0–34.0)
MCHC: 33.1 g/dL (ref 30.0–36.0)
MCV: 90.2 fL (ref 80.0–100.0)
Monocytes Absolute: 0.5 10*3/uL (ref 0.1–1.0)
Monocytes Relative: 9 %
Neutro Abs: 3.3 10*3/uL (ref 1.7–7.7)
Neutrophils Relative %: 53 %
Platelets: 258 10*3/uL (ref 150–400)
RBC: 5.22 MIL/uL (ref 4.22–5.81)
RDW: 14.5 % (ref 11.5–15.5)
WBC: 6.2 10*3/uL (ref 4.0–10.5)
nRBC: 0 % (ref 0.0–0.2)

## 2023-07-19 LAB — URINALYSIS, ROUTINE W REFLEX MICROSCOPIC
Bacteria, UA: NONE SEEN
Bilirubin Urine: NEGATIVE
Glucose, UA: NEGATIVE mg/dL
Ketones, ur: NEGATIVE mg/dL
Leukocytes,Ua: NEGATIVE
Nitrite: NEGATIVE
Protein, ur: NEGATIVE mg/dL
Specific Gravity, Urine: 1.02 (ref 1.005–1.030)
pH: 5 (ref 5.0–8.0)

## 2023-07-19 LAB — TROPONIN I (HIGH SENSITIVITY): Troponin I (High Sensitivity): 5 ng/L (ref <18)

## 2023-07-19 MED ORDER — SODIUM CHLORIDE 0.9 % IV BOLUS
1000.0000 mL | Freq: Once | INTRAVENOUS | Status: AC
  Administered 2023-07-19: 1000 mL via INTRAVENOUS

## 2023-07-19 MED ORDER — LORAZEPAM 2 MG/ML IJ SOLN
1.0000 mg | Freq: Once | INTRAMUSCULAR | Status: AC
  Administered 2023-07-19: 1 mg via INTRAVENOUS
  Filled 2023-07-19: qty 1

## 2023-07-19 NOTE — ED Provider Notes (Signed)
 [place] EMERGENCY DEPARTMENT AT [facility] Provider Note   CSN: [id number] Arrival date & time: 07/19/23  4098     History  Chief Complaint  Patient presents with   Headache    Paul Lambert is a 48 y.o. male.  Patient is a 48 year old male who presents emergency department from [facility] where he was attempting to check and for alcohol detox.  He notes that he was sent over secondary to elevated blood pressure.  He notes that he is on blood pressure medications and did not take them this morning.  Patient notes that he is having a headache as well as some lightheadedness.  He denies any associated chest pain, shortness of breath, abdominal pain, nausea, vomiting, diarrhea.  He denies any recent falls or blunt head trauma.  He notes that he has not had any alcohol to drink since last night.  He denies any associated vision or hearing changes.  He has had no numbness, paresthesias or unilateral weakness.   Headache      Home Medications Prior to Admission medications   Medication Sig Start Date End Date Taking? Authorizing Provider  gabapentin  (NEURONTIN ) 100 MG capsule Take 1 capsule (100 mg total) by mouth 2 (two) times daily. 11/04/22 05/18/23  Zarwolo, Gloria, FNP  hydrochlorothiazide  (HYDRODIURIL ) 12.5 MG tablet Take 1 tablet (12.5 mg total) by mouth daily. 06/22/21 07/22/21  Massengill, Elana Grayer, MD  methocarbamol  (ROBAXIN ) 500 MG tablet Take 1 tablet (500 mg total) by mouth 4 (four) times daily. 12/22/22   Zarwolo, Gloria, FNP  naloxone  (NARCAN ) nasal spray 4 mg/0.1 mL In the event of an opiate overdose, spray into nostril and call 911. 08/28/22   Iva Mariner, MD  naproxen  (NAPROSYN ) 500 MG tablet Take 1 tablet (500 mg total) by mouth 2 (two) times daily. 04/11/23   Zarwolo, Gloria, FNP  olmesartan -hydrochlorothiazide  (BENICAR  HCT) 20-12.5 MG tablet Take 1 tablet by mouth daily. 01/17/23   Zarwolo, Gloria, FNP  oxyCODONE -acetaminophen  (PERCOCET/ROXICET) 5-325 MG tablet Take  1 tablet by mouth 2 (two) times daily as needed. 05/12/23   [provider]  pantoprazole  (PROTONIX ) 40 MG tablet Take 1 tablet (40 mg total) by mouth daily. 01/02/23 03/03/23  Zarwolo, Gloria, FNP  traZODone  (DESYREL ) 100 MG tablet Take 1 tablet (100 mg total) by mouth at bedtime as needed for sleep. 11/04/22 12/04/22  Zarwolo, Gloria, FNP  Vitamin D , Ergocalciferol , (DRISDOL ) 1.25 MG (50000 UNIT) CAPS capsule Take 1 capsule (50,000 Units total) by mouth every 7 (seven) days. 12/02/22   Zarwolo, Gloria, FNP      Allergies    Ace inhibitors and Onion    Review of Systems   Review of Systems  Neurological:  Positive for light-headedness and headaches.  All other systems reviewed and are negative.   Physical Exam Updated Vital Signs BP (!) 152/99   Pulse 97   Resp (!) 21   Ht 5' 9 (1.753 m)   Wt 90.3 kg   SpO2 98%   BMI 29.39 kg/m  Physical Exam Vitals and nursing note reviewed.  Constitutional:      General: He is not in acute distress.    Appearance: Normal appearance. He is not ill-appearing.  HENT:     Head: Normocephalic and atraumatic.     Nose: Nose normal.     Mouth/Throat:     Mouth: Mucous membranes are moist.  Eyes:     Extraocular Movements: Extraocular movements intact.     Conjunctiva/sclera: Conjunctivae normal.  Pupils: Pupils are equal, round, and reactive to light.  Cardiovascular:     Rate and Rhythm: Normal rate and regular rhythm.     Pulses: Normal pulses.     Heart sounds: Normal heart sounds. No murmur heard.    No gallop.  Pulmonary:     Effort: Pulmonary effort is normal. No respiratory distress.     Breath sounds: Normal breath sounds. No stridor. No wheezing, rhonchi or rales.  Abdominal:     General: Abdomen is flat. Bowel sounds are normal. There is no distension.     Palpations: Abdomen is soft.     Tenderness: There is no abdominal tenderness. There is no guarding.  Musculoskeletal:        General: No swelling or tenderness.  Normal range of motion.     Cervical back: Normal range of motion and neck supple. No rigidity.  Lymphadenopathy:     Cervical: No cervical adenopathy.  Skin:    General: Skin is warm and dry.     Findings: No rash.  Neurological:     General: No focal deficit present.     Mental Status: He is alert and oriented to person, place, and time. Mental status is at baseline.     Cranial Nerves: No cranial nerve deficit, dysarthria or facial asymmetry.     Sensory: No sensory deficit.     Motor: No weakness.  Psychiatric:        Mood and Affect: Mood normal. Mood is not anxious or depressed.        Behavior: Behavior normal. Behavior is not agitated.        Thought Content: Thought content normal.        Cognition and Memory: Cognition is not impaired. Memory is not impaired.        Judgment: Judgment normal.     ED Results / Procedures / Treatments   Labs (all labs ordered are listed, but only abnormal results are displayed) Labs Reviewed  COMPREHENSIVE METABOLIC PANEL WITH GFR  CBC WITH DIFFERENTIAL/PLATELET  URINALYSIS, ROUTINE W REFLEX MICROSCOPIC  TROPONIN I (HIGH SENSITIVITY)    EKG EKG Interpretation Date/Time:  Wednesday July 19 2023 09:13:54 EDT Ventricular Rate:  104 PR Interval:  144 QRS Duration:  93 QT Interval:  377 QTC Calculation: 496 R Axis:   48  Text Interpretation: Sinus tachycardia Atrial premature complex Borderline prolonged QT interval Confirmed by Jerald Molly [phone]) on 07/19/2023 9:18:16 AM  Radiology No results found.  Procedures Procedures    Medications Ordered in ED Medications  sodium chloride  0.9 % bolus 1,000 mL (1,000 mLs Intravenous New Bag/Given 07/19/23 1028)  LORazepam  (ATIVAN ) injection 1 mg (1 mg Intravenous Given 07/19/23 1028)    ED Course/ Medical Decision Making/ A&P                                 Medical Decision Making Amount and/or Complexity of Data Reviewed Labs: ordered. Radiology:  ordered.  Risk Prescription drug management.   This patient presents to the ED for concern of headache, elevated blood pressure differential diagnosis includes intracranial hypertension, intracranial hemorrhage, stress headache, migraine, withdrawal    Additional history obtained:  Additional history obtained from family External records from outside source obtained and reviewed including medical records   Lab Tests:  I Ordered, and personally interpreted labs.  The pertinent results include: No leukocytosis, no anemia, normal kidney function liver function, normal electrolytes, unremarkable  urinalysis, negative troponin   Imaging Studies ordered:  I ordered imaging studies including CT scan of head I independently visualized and interpreted imaging which showed no acute intracranial process I agree with the radiologist interpretation   Medicines ordered and prescription drug management:  I ordered medication including Ativan , IV fluids for withdrawal, dehydration Reevaluation of the patient after these medicines showed that the patient improved I have reviewed the patients home medicines and have made adjustments as needed   Problem List / ED Course:  Patient is doing very well at this time and is stable for discharge home.  Current blood pressure is 138/99.  Blood work has been overall unremarkable and CT scan of the head demonstrated no signs of acute process.  He has no concerning neurological deficits at this point.  Do not suspect space-occupying lesion, meningitis, subarachnoid hemorrhage.  Suspect that his symptoms are secondary to alcohol withdrawal at this point.  He has no indication for seizure-like activity or DTs.  He is already going to rehab at this point.  Do not suspect any further emergent workup is warranted or admission at this time.  Strict turn precautions were provided for any new or worsening symptoms.  Patient voiced understand to the plan and had no  additional questions.   Social Determinants of Health:  None           Final Clinical Impression(s) / ED Diagnoses Final diagnoses:  None    Rx / DC Orders ED Discharge Orders     None         Emmalene Hare 07/19/23 1151    Arvilla Birmingham, MD 07/19/23 1643

## 2023-07-19 NOTE — Discharge Instructions (Signed)
 Please continue to take all medications as directed.  Follow-up closely with your primary care doctor on an outpatient basis.  Return to the emergency department immediately for any new or worsening symptoms.

## 2023-07-19 NOTE — ED Triage Notes (Signed)
 Pt states he was at [facility] this am to get detox from alcohol and they sent him to ED due to high BP  Pt is unsure of his BP there the staff just told him it was high and he needed to come to ED  Pt drinks everyday and his last drink was last night  Pt c/o headache and feeling dizzy at this time

## 2023-11-10 DIAGNOSIS — R1084 Generalized abdominal pain: Secondary | ICD-10-CM | POA: Diagnosis not present

## 2023-11-10 DIAGNOSIS — I10 Essential (primary) hypertension: Secondary | ICD-10-CM | POA: Diagnosis not present

## 2023-11-17 ENCOUNTER — Ambulatory Visit: Admission: EM | Admit: 2023-11-17 | Discharge: 2023-11-17 | Discharge status: Against Medical Advice

## 2023-11-17 NOTE — ED Notes (Signed)
 Pt not found in waiting room. Call x1. Pt mother answered phone and states pt not available and there is not another number for him.

## 2023-11-17 NOTE — ED Notes (Signed)
 Pt not found in waiting room. Provider aware.
